# Patient Record
Sex: Male | Born: 1960 | Hispanic: No | Marital: Single | Smoking: Never smoker
Health system: Southern US, Community
[De-identification: ages and names within clinical notes are randomized; demographics above are authoritative.]

## PROBLEM LIST (undated history)

## (undated) ENCOUNTER — Telehealth

## (undated) ENCOUNTER — Encounter

## (undated) ENCOUNTER — Ambulatory Visit: Payer: Medicare (Managed Care)

## (undated) ENCOUNTER — Encounter: Attending: Plastic and Reconstructive Surgery | Primary: Plastic and Reconstructive Surgery

## (undated) ENCOUNTER — Encounter: Attending: Gastroenterology | Primary: Gastroenterology

## (undated) ENCOUNTER — Ambulatory Visit: Payer: MEDICARE

## (undated) ENCOUNTER — Ambulatory Visit

## (undated) ENCOUNTER — Encounter
Attending: Student in an Organized Health Care Education/Training Program | Primary: Student in an Organized Health Care Education/Training Program

## (undated) ENCOUNTER — Ambulatory Visit: Payer: MEDICARE | Attending: Hand Surgery | Primary: Hand Surgery

## (undated) ENCOUNTER — Encounter: Attending: General Acute Care Hospital | Primary: General Acute Care Hospital

## (undated) ENCOUNTER — Encounter: Attending: Orthopaedic Surgery | Primary: Orthopaedic Surgery

## (undated) ENCOUNTER — Encounter: Attending: Adult Health | Primary: Adult Health

## (undated) ENCOUNTER — Encounter: Attending: Physician Assistant | Primary: Physician Assistant

## (undated) ENCOUNTER — Ambulatory Visit
Payer: Medicare (Managed Care) | Attending: Plastic and Reconstructive Surgery | Primary: Plastic and Reconstructive Surgery

## (undated) ENCOUNTER — Telehealth: Attending: Family | Primary: Family

## (undated) ENCOUNTER — Ambulatory Visit: Attending: Orthopaedic Surgery | Primary: Orthopaedic Surgery

## (undated) ENCOUNTER — Inpatient Hospital Stay

## (undated) ENCOUNTER — Ambulatory Visit: Payer: Medicare (Managed Care) | Attending: Nephrology | Primary: Nephrology

## (undated) ENCOUNTER — Encounter: Attending: Family | Primary: Family

## (undated) ENCOUNTER — Telehealth: Attending: Plastic and Reconstructive Surgery | Primary: Plastic and Reconstructive Surgery

## (undated) ENCOUNTER — Ambulatory Visit: Payer: Medicare (Managed Care) | Attending: Hematology & Oncology | Primary: Hematology & Oncology

## (undated) ENCOUNTER — Ambulatory Visit
Payer: MEDICARE | Attending: Student in an Organized Health Care Education/Training Program | Primary: Student in an Organized Health Care Education/Training Program

## (undated) ENCOUNTER — Ambulatory Visit
Payer: Medicare (Managed Care) | Attending: Student in an Organized Health Care Education/Training Program | Primary: Student in an Organized Health Care Education/Training Program

## (undated) ENCOUNTER — Encounter: Attending: Family Medicine | Primary: Family Medicine

## (undated) ENCOUNTER — Ambulatory Visit: Payer: Medicare (Managed Care) | Attending: Family | Primary: Family

## (undated) ENCOUNTER — Ambulatory Visit: Payer: MEDICARE | Attending: Nephrology | Primary: Nephrology

## (undated) ENCOUNTER — Encounter: Attending: Medical | Primary: Medical

## (undated) ENCOUNTER — Ambulatory Visit
Payer: Medicare (Managed Care) | Attending: Rehabilitative and Restorative Service Providers" | Primary: Rehabilitative and Restorative Service Providers"

## (undated) ENCOUNTER — Ambulatory Visit: Attending: Family | Primary: Family

## (undated) DIAGNOSIS — N182 Chronic kidney disease, stage 2 (mild): Secondary | ICD-10-CM

## (undated) DIAGNOSIS — L409 Psoriasis, unspecified: Secondary | ICD-10-CM

## (undated) DIAGNOSIS — G709 Myoneural disorder, unspecified: Secondary | ICD-10-CM

## (undated) DIAGNOSIS — I10 Essential (primary) hypertension: Secondary | ICD-10-CM

## (undated) DIAGNOSIS — K219 Gastro-esophageal reflux disease without esophagitis: Secondary | ICD-10-CM

## (undated) DIAGNOSIS — E119 Type 2 diabetes mellitus without complications: Secondary | ICD-10-CM

---

## 2017-05-06 ENCOUNTER — Ambulatory Visit: Admit: 2017-05-06 | Discharge: 2017-05-06

## 2017-05-20 ENCOUNTER — Ambulatory Visit: Admit: 2017-05-20 | Discharge: 2017-05-20

## 2017-05-27 ENCOUNTER — Ambulatory Visit: Admit: 2017-05-27 | Discharge: 2017-05-28

## 2017-06-03 ENCOUNTER — Ambulatory Visit: Admit: 2017-06-03 | Discharge: 2017-06-04

## 2017-06-10 ENCOUNTER — Ambulatory Visit: Admit: 2017-06-10 | Discharge: 2017-06-11

## 2017-06-19 ENCOUNTER — Ambulatory Visit: Admit: 2017-06-19 | Discharge: 2017-06-20

## 2017-06-24 ENCOUNTER — Ambulatory Visit: Admit: 2017-06-24 | Discharge: 2017-06-25

## 2017-07-03 ENCOUNTER — Ambulatory Visit: Admit: 2017-07-03 | Discharge: 2017-07-04

## 2017-07-10 ENCOUNTER — Ambulatory Visit: Admit: 2017-07-10 | Discharge: 2017-07-11

## 2018-04-03 ENCOUNTER — Ambulatory Visit: Admit: 2018-04-03 | Discharge: 2018-04-06

## 2018-04-10 ENCOUNTER — Ambulatory Visit: Admit: 2018-04-10 | Discharge: 2018-04-10

## 2018-04-10 DIAGNOSIS — L03818 Cellulitis of other sites: Principal | ICD-10-CM

## 2018-04-11 ENCOUNTER — Ambulatory Visit
Admit: 2018-04-11 | Discharge: 2018-06-04 | Disposition: A | Discharge status: Home / Self Care | Payer: MEDICARE | Source: Other Acute Inpatient Hospital

## 2018-04-11 ENCOUNTER — Encounter
Admit: 2018-04-11 | Discharge: 2018-06-04 | Disposition: A | Discharge status: Home / Self Care | Payer: MEDICARE | Source: Other Acute Inpatient Hospital | Attending: Anesthesiology

## 2018-04-11 ENCOUNTER — Encounter
Admit: 2018-04-11 | Discharge: 2018-06-04 | Disposition: A | Discharge status: Home / Self Care | Payer: MEDICARE | Source: Other Acute Inpatient Hospital | Attending: Student in an Organized Health Care Education/Training Program

## 2018-04-11 DIAGNOSIS — L03818 Cellulitis of other sites: Principal | ICD-10-CM

## 2018-04-19 DIAGNOSIS — L03818 Cellulitis of other sites: Principal | ICD-10-CM

## 2018-04-20 DIAGNOSIS — L03818 Cellulitis of other sites: Principal | ICD-10-CM

## 2018-04-22 DIAGNOSIS — L03818 Cellulitis of other sites: Principal | ICD-10-CM

## 2018-04-23 DIAGNOSIS — L03818 Cellulitis of other sites: Principal | ICD-10-CM

## 2018-04-27 DIAGNOSIS — L03818 Cellulitis of other sites: Principal | ICD-10-CM

## 2018-05-01 DIAGNOSIS — L03818 Cellulitis of other sites: Principal | ICD-10-CM

## 2018-05-05 DIAGNOSIS — L03818 Cellulitis of other sites: Principal | ICD-10-CM

## 2018-05-07 DIAGNOSIS — L03818 Cellulitis of other sites: Principal | ICD-10-CM

## 2018-05-11 DIAGNOSIS — L03818 Cellulitis of other sites: Principal | ICD-10-CM

## 2018-05-18 DIAGNOSIS — L03818 Cellulitis of other sites: Principal | ICD-10-CM

## 2018-05-26 ENCOUNTER — Ambulatory Visit: Admit: 2018-05-26 | Discharge: 2018-05-27 | Payer: MEDICARE | Attending: Hand Surgery | Primary: Hand Surgery

## 2018-05-27 DIAGNOSIS — L03818 Cellulitis of other sites: Principal | ICD-10-CM

## 2018-05-28 DIAGNOSIS — L03818 Cellulitis of other sites: Principal | ICD-10-CM

## 2018-05-29 DIAGNOSIS — L03818 Cellulitis of other sites: Principal | ICD-10-CM

## 2018-06-03 DIAGNOSIS — L03818 Cellulitis of other sites: Principal | ICD-10-CM

## 2018-06-04 MED ORDER — OXYCODONE 10 MG TABLET
ORAL_TABLET | Freq: Four times a day (QID) | ORAL | 0 refills | 0 days | Status: CP | PRN
Start: 2018-06-04 — End: 2018-06-09
  Filled 2018-06-04: qty 20, 5d supply, fill #0

## 2018-06-04 MED ORDER — LEVEMIR FLEXTOUCH U-100 INSULIN 100 UNIT/ML (3 ML) SUBCUTANEOUS PEN
Freq: Every evening | SUBCUTANEOUS | 0 refills | 0.00000 days
Start: 2018-06-04 — End: 2018-07-04

## 2018-06-04 MED ORDER — INSULIN ASPART U-100  100 UNIT/ML SUBCUTANEOUS SOLUTION
Freq: Three times a day (TID) | SUBCUTANEOUS | 3 refills | 0 days
Start: 2018-06-04 — End: ?

## 2018-06-04 MED ORDER — GABAPENTIN 300 MG CAPSULE
ORAL_CAPSULE | Freq: Three times a day (TID) | ORAL | 0 refills | 0 days | Status: CP
Start: 2018-06-04 — End: 2018-07-04
  Filled 2018-06-04: qty 270, 30d supply, fill #0

## 2018-06-04 MED ORDER — BACITRACIN ZINC 500 UNIT/GRAM TOPICAL OINTMENT
Freq: Two times a day (BID) | TOPICAL | 0 refills | 0.00000 days | Status: CP | PRN
Start: 2018-06-04 — End: ?

## 2018-06-04 MED FILL — LISINOPRIL 10 MG TABLET: 30 days supply | Qty: 30 | Fill #0 | Status: AC

## 2018-06-04 MED FILL — CHLORTHALIDONE 25 MG TABLET: 30 days supply | Qty: 30 | Fill #0 | Status: AC

## 2018-06-04 MED FILL — OXYCODONE 10 MG TABLET: 5 days supply | Qty: 20 | Fill #0 | Status: AC

## 2018-06-04 MED FILL — GABAPENTIN 300 MG CAPSULE: 30 days supply | Qty: 270 | Fill #0 | Status: AC

## 2018-06-05 MED ORDER — CHLORTHALIDONE 25 MG TABLET
ORAL_TABLET | Freq: Every day | ORAL | 0 refills | 0 days | Status: CP
Start: 2018-06-05 — End: 2018-07-05
  Filled 2018-06-04: qty 30, 30d supply, fill #0

## 2018-06-05 MED ORDER — LISINOPRIL 10 MG TABLET
ORAL_TABLET | Freq: Every day | ORAL | 0 refills | 0.00000 days | Status: CP
Start: 2018-06-05 — End: 2018-07-05
  Filled 2018-06-04: qty 30, 30d supply, fill #0

## 2018-11-30 ENCOUNTER — Ambulatory Visit: Admit: 2018-11-30 | Discharge: 2018-12-01

## 2018-12-07 ENCOUNTER — Ambulatory Visit: Admit: 2018-12-07 | Discharge: 2018-12-07

## 2019-01-03 ENCOUNTER — Ambulatory Visit: Admit: 2019-01-03 | Discharge: 2019-01-03

## 2019-03-26 ENCOUNTER — Ambulatory Visit: Admit: 2019-03-26 | Discharge: 2019-03-29

## 2019-03-29 DIAGNOSIS — L03011 Cellulitis of right finger: Principal | ICD-10-CM

## 2019-03-30 ENCOUNTER — Ambulatory Visit: Admit: 2019-03-30 | Discharge: 2019-03-30

## 2019-04-03 ENCOUNTER — Ambulatory Visit: Admit: 2019-04-03 | Discharge: 2019-04-06

## 2019-04-21 ENCOUNTER — Ambulatory Visit
Admit: 2019-04-21 | Discharge: 2019-04-22 | Payer: MEDICARE | Attending: Orthopaedic Surgery | Primary: Orthopaedic Surgery

## 2019-04-21 DIAGNOSIS — S68119A Complete traumatic metacarpophalangeal amputation of unspecified finger, initial encounter: Principal | ICD-10-CM

## 2019-04-21 DIAGNOSIS — E1140 Type 2 diabetes mellitus with diabetic neuropathy, unspecified: Principal | ICD-10-CM

## 2019-04-21 DIAGNOSIS — L03011 Cellulitis of right finger: Principal | ICD-10-CM

## 2019-05-01 ENCOUNTER — Ambulatory Visit: Admit: 2019-05-01 | Discharge: 2019-05-02

## 2019-05-05 ENCOUNTER — Ambulatory Visit: Admit: 2019-05-05 | Discharge: 2019-05-06

## 2019-05-12 ENCOUNTER — Emergency Department
Admission: EM | Admit: 2019-05-12 | Discharge: 2019-05-12 | Disposition: A | Discharge status: Home / Self Care | Payer: Medicare Other | Attending: Emergency Medicine | Admitting: Emergency Medicine

## 2019-05-12 ENCOUNTER — Other Ambulatory Visit: Payer: Self-pay

## 2019-05-12 ENCOUNTER — Encounter: Payer: Self-pay | Admitting: Emergency Medicine

## 2019-05-12 ENCOUNTER — Emergency Department: Payer: Medicare Other

## 2019-05-12 DIAGNOSIS — W19XXXA Unspecified fall, initial encounter: Secondary | ICD-10-CM

## 2019-05-12 DIAGNOSIS — S0990XA Unspecified injury of head, initial encounter: Secondary | ICD-10-CM | POA: Diagnosis present

## 2019-05-12 DIAGNOSIS — E119 Type 2 diabetes mellitus without complications: Secondary | ICD-10-CM | POA: Diagnosis not present

## 2019-05-12 DIAGNOSIS — Y939 Activity, unspecified: Secondary | ICD-10-CM | POA: Diagnosis not present

## 2019-05-12 DIAGNOSIS — W010XXA Fall on same level from slipping, tripping and stumbling without subsequent striking against object, initial encounter: Secondary | ICD-10-CM | POA: Diagnosis not present

## 2019-05-12 DIAGNOSIS — S00411A Abrasion of right ear, initial encounter: Secondary | ICD-10-CM

## 2019-05-12 DIAGNOSIS — Y92002 Bathroom of unspecified non-institutional (private) residence single-family (private) house as the place of occurrence of the external cause: Secondary | ICD-10-CM | POA: Diagnosis not present

## 2019-05-12 DIAGNOSIS — Y999 Unspecified external cause status: Secondary | ICD-10-CM | POA: Diagnosis not present

## 2019-05-12 HISTORY — DX: Type 2 diabetes mellitus without complications: E11.9

## 2019-05-12 MED ORDER — HYDROCODONE-ACETAMINOPHEN 5-325 MG PO TABS
1.0000 | ORAL_TABLET | Freq: Once | ORAL | Status: AC
  Administered 2019-05-12: 1 via ORAL
  Filled 2019-05-12: qty 1

## 2019-05-12 MED ORDER — ONDANSETRON 4 MG PO TBDP: 4.0000 mg | ORAL_TABLET | Freq: Three times a day (TID) | ORAL | 0 refills | Status: DC | PRN

## 2019-05-12 MED ORDER — BACITRACIN ZINC 500 UNIT/GM EX OINT
TOPICAL_OINTMENT | Freq: Once | CUTANEOUS | Status: AC
  Administered 2019-05-12: 1 via TOPICAL
  Filled 2019-05-12: qty 0.9

## 2019-05-12 MED ORDER — ONDANSETRON 4 MG PO TBDP
4.0000 mg | ORAL_TABLET | Freq: Once | ORAL | Status: AC
  Administered 2019-05-12: 4 mg via ORAL
  Filled 2019-05-12: qty 1

## 2019-05-12 MED ORDER — HYDROCODONE-ACETAMINOPHEN 5-325 MG PO TABS: 1.0000 | ORAL_TABLET | Freq: Four times a day (QID) | ORAL | 0 refills | Status: DC | PRN

## 2019-05-12 NOTE — ED Provider Notes (Signed)
[facility] Emergency Department Provider Note   ____________________________________________   First MD Initiated Contact with Patient 05/12/19 [phone]     (approximate)  I have reviewed the triage vital signs and the nursing notes.   HISTORY  Chief Complaint Fall    HPI Christian Flores is a 59 y.o. male who presents to the ED from home status post mechanical fall.  Patient was walking to the bathroom with his cane when he slipped and struck the right side of his head and ear on his bathtub.  Denies LOC.  Complains of head pain.  Denies vision changes, neck pain, chest pain, shortness of breath, abdominal pain, nausea, vomiting or dizziness.  Denies anticoagulant use.       Past Medical History:  Diagnosis Date  . Diabetes mellitus without complication ([place])     There are no problems to display for this patient.   History reviewed. No pertinent surgical history.  Prior to Admission medications   Not on File    Allergies Patient has no known allergies.  History reviewed. No pertinent family history.  Social History Social History   Tobacco Use  . Smoking status: Never Smoker  . Smokeless tobacco: Never Used  Substance Use Topics  . Alcohol use: Not on file  . Drug use: Not on file    Review of Systems  Constitutional: No fever/chills Eyes: No visual changes. ENT: Positive for right ear abrasion.  No sore throat. Cardiovascular: Denies chest pain. Respiratory: Denies shortness of breath. Gastrointestinal: No abdominal pain.  No nausea, no vomiting.  No diarrhea.  No constipation. Genitourinary: Negative for dysuria. Musculoskeletal: Negative for back pain. Skin: Negative for rash. Neurological: Positive for head pain.  Negative for headaches, focal weakness or numbness.   ____________________________________________   PHYSICAL EXAM:  VITAL SIGNS: ED Triage Vitals [05/12/19 0410]  Enc Vitals Group     BP 127/76     Pulse  Rate 90     Resp 18     Temp 98.8 F (37.1 C)     Temp Source Oral     SpO2 99 %     Weight      Height      Head Circumference      Peak Flow      Pain Score      Pain Loc      Pain Edu?      Excl. in [place]?     Constitutional: Alert and oriented. Well appearing and in no acute distress. Eyes: Conjunctivae are normal. PERRL. EOMI. Head: Atraumatic. Ears: Top of right ear with small abrasion.  No active bleeding.  No laceration. Nose: Atraumatic. Mouth/Throat: Mucous membranes are moist.  No dental malocclusion. Neck: No stridor.  No cervical spine tenderness to palpation. Cardiovascular: Normal rate, regular rhythm. Grossly normal heart sounds.  Good peripheral circulation. Respiratory: Normal respiratory effort.  No retractions. Lungs CTAB. Gastrointestinal: Soft and nontender. No distention. No abdominal bruits. No CVA tenderness. Musculoskeletal: No lower extremity tenderness nor edema.  No joint effusions. Neurologic: Alert and oriented x3.  CN II-XII grossly intact.  Normal speech and language. No gross focal neurologic deficits are appreciated.  Skin:  Skin is warm, dry and intact. No rash noted. Psychiatric: Mood and affect are normal. Speech and behavior are normal.  ____________________________________________   LABS (all labs ordered are listed, but only abnormal results are displayed)  Labs Reviewed - No data to display ____________________________________________  EKG  None ____________________________________________  RADIOLOGY  ED MD interpretation: No ICH  Official radiology report(s): CT Head Wo Contrast  Result Date: 05/12/2019 CLINICAL DATA:  Head trauma with headache EXAM: CT HEAD WITHOUT CONTRAST TECHNIQUE: Contiguous axial images were obtained from the base of the skull through the vertex without intravenous contrast. COMPARISON:  None. FINDINGS: Brain: No evidence of acute infarction, hemorrhage, hydrocephalus, extra-axial collection or mass  lesion/mass effect. Vascular: No hyperdense vessel or unexpected calcification. Skull: Normal. Negative for fracture or focal lesion. Sinuses/Orbits: No acute finding. IMPRESSION: Negative.  No evidence of intracranial injury. Electronically Signed   By: Marnee Spring M.D.   On: 05/12/2019 04:48    ____________________________________________   PROCEDURES  Procedure(s) performed (including Critical Care):  Procedures   ____________________________________________   INITIAL IMPRESSION / ASSESSMENT AND PLAN / ED COURSE  As part of my medical decision making, I reviewed the following data within the electronic MEDICAL RECORD NUMBER Nursing notes reviewed and incorporated, Old chart reviewed, Radiograph reviewed, Notes from prior ED visits and [place] Controlled Substance Database     Christian Flores was evaluated in Emergency Department on 05/12/2019 for the symptoms described in the history of present illness. He was evaluated in the context of the global COVID-19 pandemic, which necessitated consideration that the patient might be at risk for infection with the SARS-CoV-2 virus that causes COVID-19. Institutional protocols and algorithms that pertain to the evaluation of patients at risk for COVID-19 are in a state of rapid change based on information released by regulatory bodies including the CDC and federal and state organizations. These policies and algorithms were followed during the patient's care in the ED.    59 year old male who presents status post mechanical fall with head pain and right ear abrasion.  Differential diagnosis includes but is not limited to ICH, SDH, concussion, etc.  Will obtain noncontrast CT head, apply bacitracin to right ear abrasion; Norco paired with Zofran for head pain.  Will reassess.   Clinical Course as of May 11 454  Wed May 12, 2019  2979 Updated patient on unremarkable CT head.  Strict head injury precautions given.  Patient verbalizes understanding  agrees with plan of care.   [JS]    Clinical Course User Index [JS] Irean Hong, MD     ____________________________________________   FINAL CLINICAL IMPRESSION(S) / ED DIAGNOSES  Final diagnoses:  Fall, initial encounter  Abrasion of right ear, initial encounter  Injury of head, initial encounter     ED Discharge Orders    None       Note:  This document was prepared using Dragon voice recognition software and may include unintentional dictation errors.   Irean Hong, MD 05/12/19 [phone]

## 2019-05-12 NOTE — ED Notes (Signed)
Signature pad not working. 

## 2019-05-12 NOTE — Discharge Instructions (Addendum)
1.  You may take medicines as needed for pain and nausea (Norco/Zofran #15). 2.  Return to the ER for worsening symptoms, persistent vomiting, lethargy or other concerns.

## 2019-05-12 NOTE — ED Triage Notes (Signed)
Pt reports he slipped while walking to bathroom this AM. Pt hit right sided of head and is now c/o HA and has laceration to the right ear. Pt denies LOC.

## 2019-05-19 ENCOUNTER — Ambulatory Visit
Admit: 2019-05-19 | Discharge: 2019-05-20 | Payer: MEDICARE | Attending: Orthopaedic Surgery | Primary: Orthopaedic Surgery

## 2019-06-01 ENCOUNTER — Ambulatory Visit: Admit: 2019-06-01 | Discharge: 2019-06-02

## 2019-08-25 ENCOUNTER — Ambulatory Visit: Admit: 2019-08-25 | Discharge: 2019-08-25

## 2020-08-14 IMAGING — CT CT HEAD W/O CM
3 series · 16 of 45 positions shown, 19 images · non-contrast
Comparison: None.

CLINICAL DATA: Head trauma with headache

EXAM:
CT HEAD WITHOUT CONTRAST
TECHNIQUE: Contiguous axial images were obtained from the base of the skull
through the vertex without intravenous contrast.

[Series 2: head wo · axial · 0.42mm/px · z∈[-109,+6]mm · 10 of 28 slices shown, 13 images]
[im 3/28  brain]
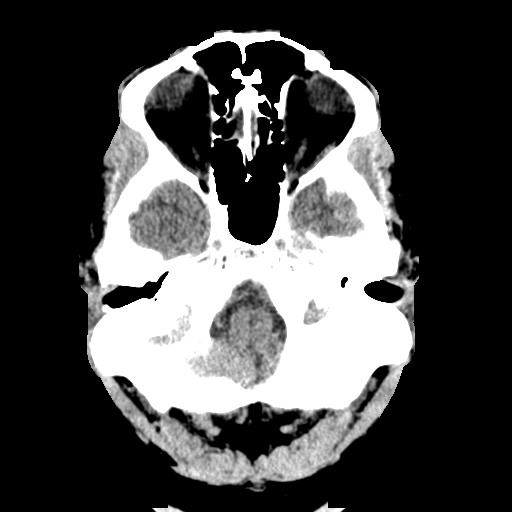
[im 3/28  bone]
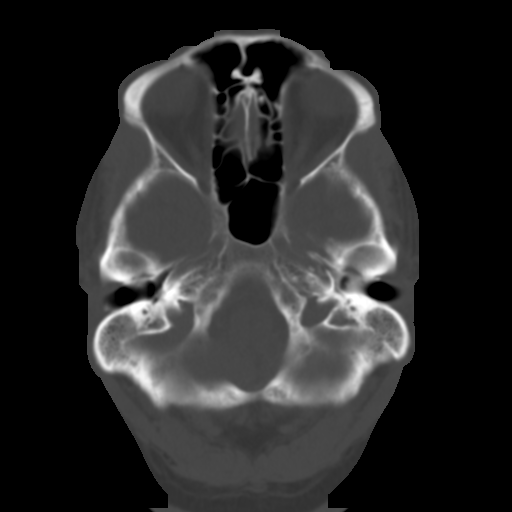
[im 5/28  brain]
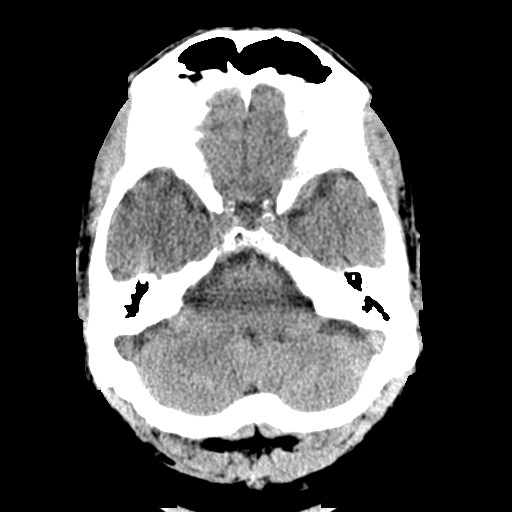
[im 8/28  brain]
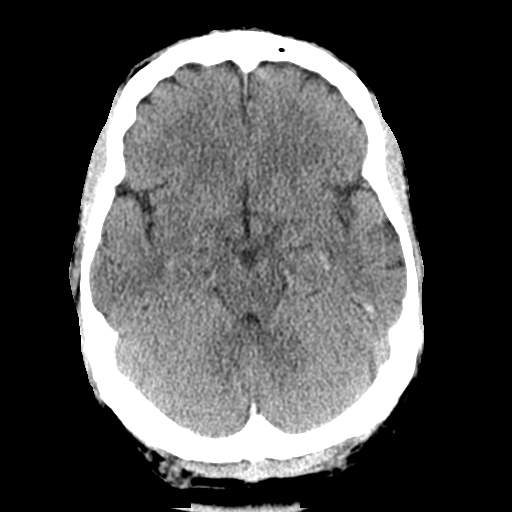
[im 11/28  brain]
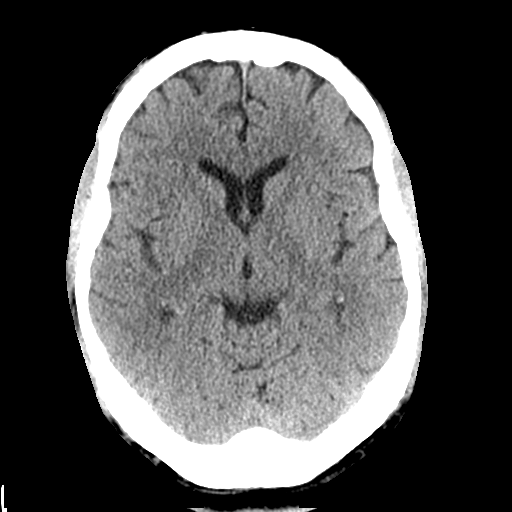
[im 13/28  brain]
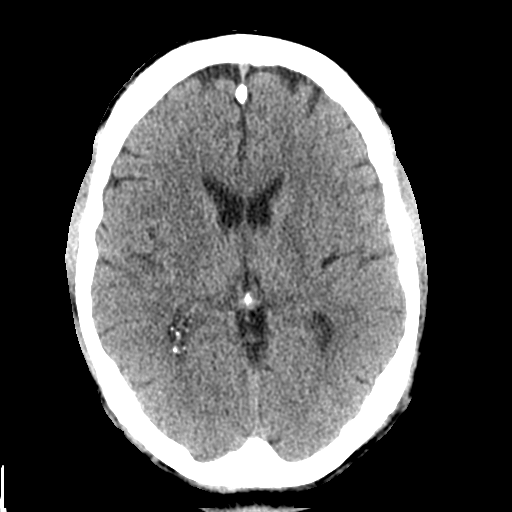
[im 13/28  bone]
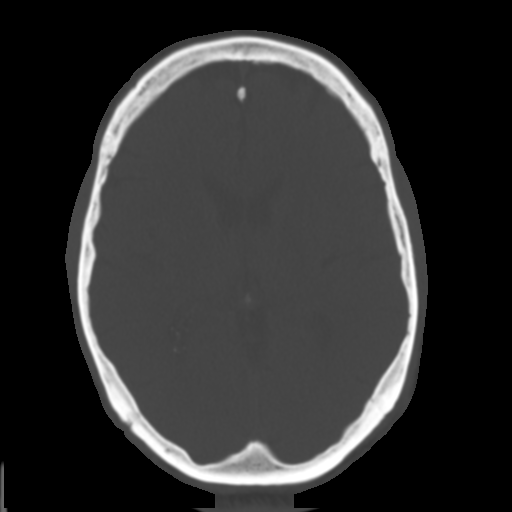
[im 16/28  brain]
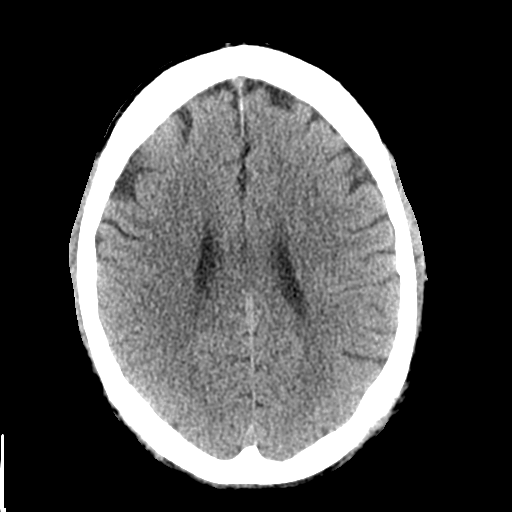
[im 18/28  brain]
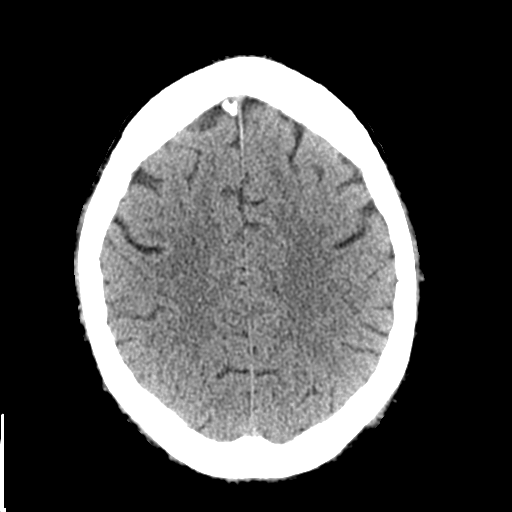
[im 21/28  brain]
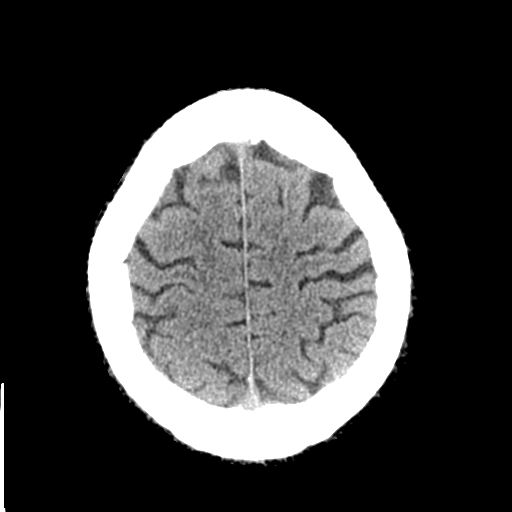
[im 24/28  brain]
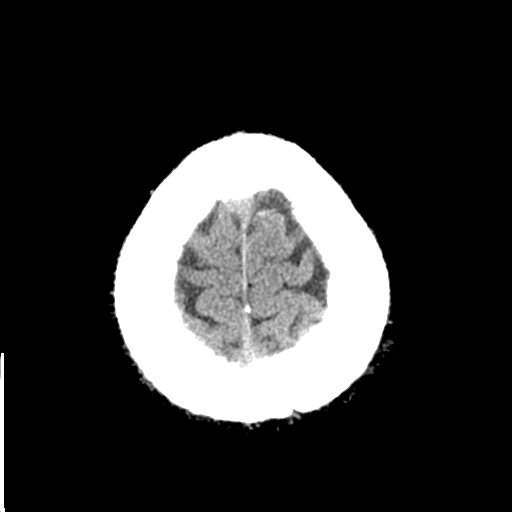
[im 24/28  bone]
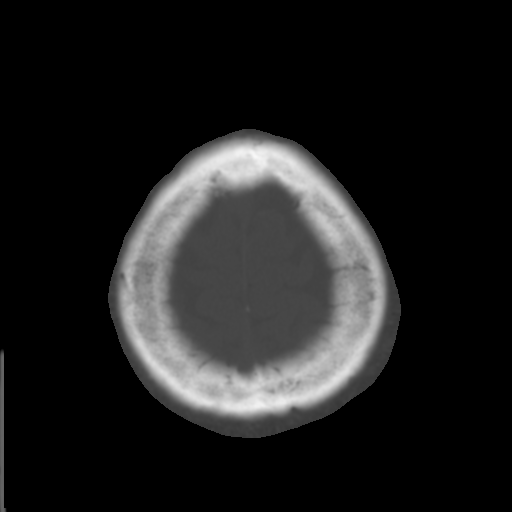
[im 26/28  brain]
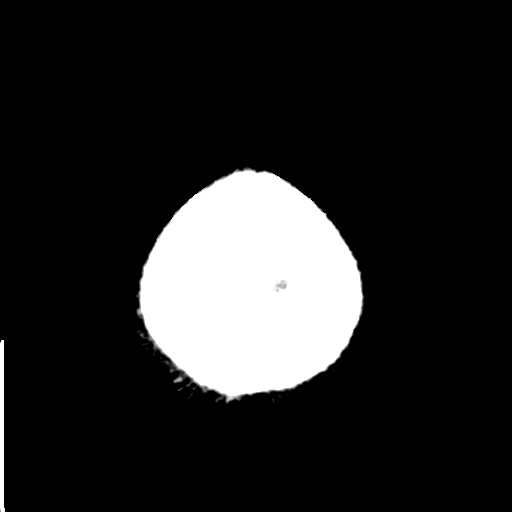

[Series 4: coronal soft tissue · coronal · 0.33mm/px · 3 of 68 slices shown]
[im 23/68  brain]
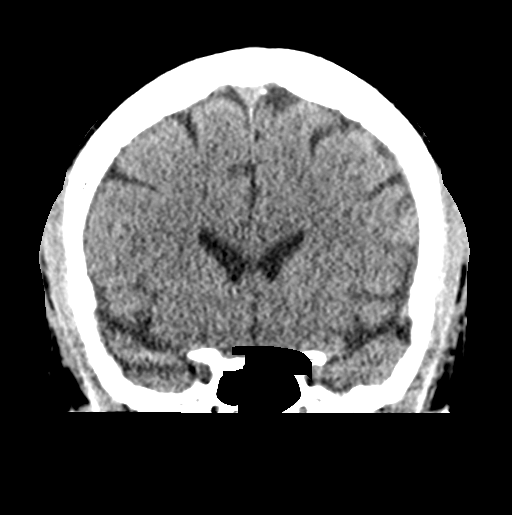
[im 30/68  brain]
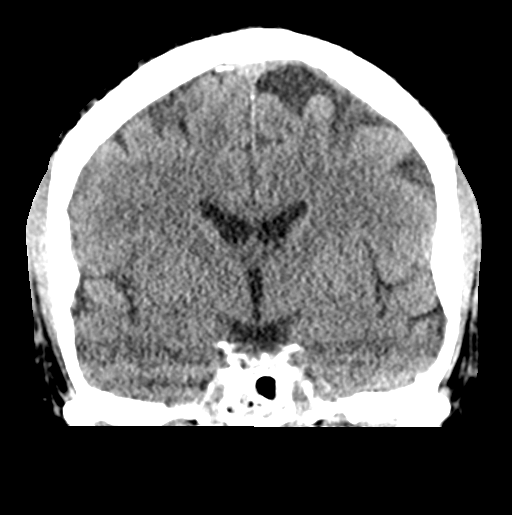
[im 38/68  brain]
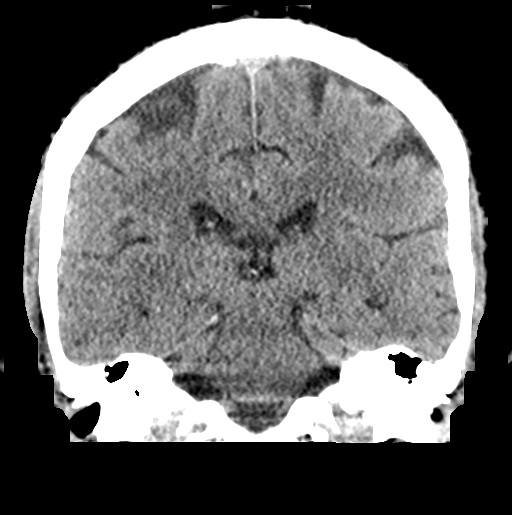

[Series 5: sagittal soft tissue · sagittal · 0.33mm/px · 3 of 52 slices shown]
[im 18/52  brain]
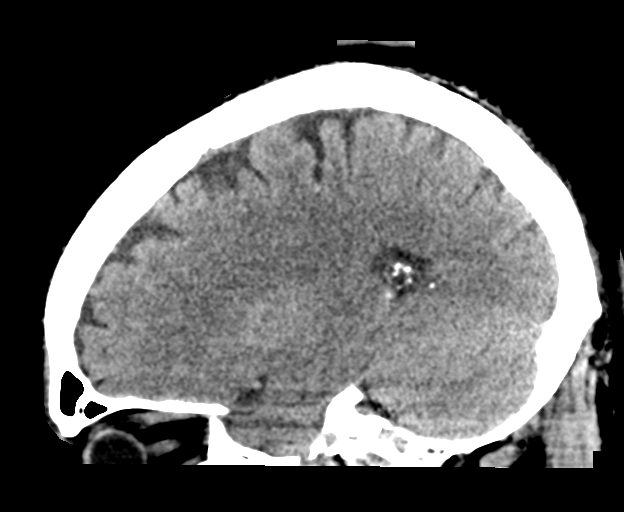
[im 26/52  brain]
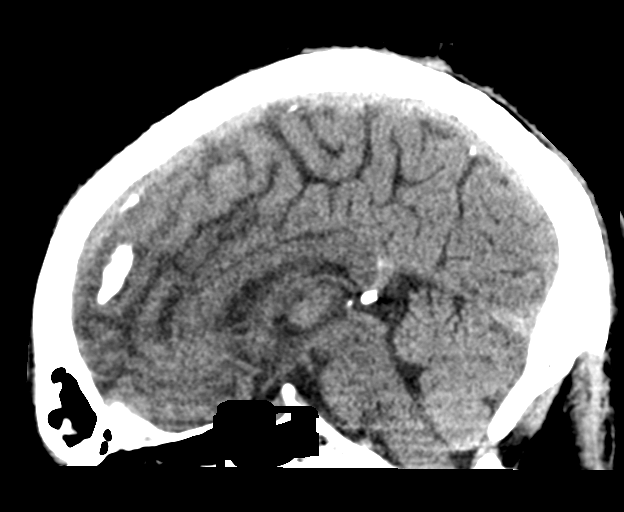
[im 35/52  brain]
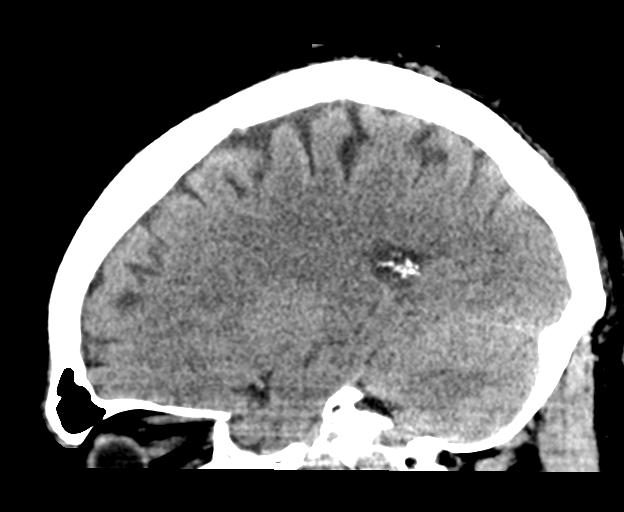

[16 of 45 positions shown; findings below may reference images not displayed]

FINDINGS: Brain: No evidence of acute infarction, hemorrhage, hydrocephalus,
extra-axial collection or mass lesion/mass effect.

Vascular: No hyperdense vessel or unexpected calcification.

Skull: Normal. Negative for fracture or focal lesion.

Sinuses/Orbits: No acute finding.
IMPRESSION: Negative.  No evidence of intracranial injury.

## 2020-08-29 ENCOUNTER — Ambulatory Visit: Admit: 2020-08-29 | Discharge: 2020-08-30 | Payer: MEDICARE

## 2020-08-29 DIAGNOSIS — Z0000 Encounter for general adult medical examination without abnormal findings: Principal | ICD-10-CM

## 2020-08-29 DIAGNOSIS — L409 Psoriasis, unspecified: Principal | ICD-10-CM

## 2020-08-29 DIAGNOSIS — K219 Gastro-esophageal reflux disease without esophagitis: Principal | ICD-10-CM

## 2020-08-29 DIAGNOSIS — E1142 Type 2 diabetes mellitus with diabetic polyneuropathy: Principal | ICD-10-CM

## 2020-08-29 DIAGNOSIS — D649 Anemia, unspecified: Principal | ICD-10-CM

## 2020-08-29 DIAGNOSIS — I10 Essential (primary) hypertension: Principal | ICD-10-CM

## 2020-08-29 DIAGNOSIS — Z125 Encounter for screening for malignant neoplasm of prostate: Principal | ICD-10-CM

## 2020-08-29 DIAGNOSIS — E11628 Type 2 diabetes mellitus with other skin complications: Principal | ICD-10-CM

## 2020-08-29 DIAGNOSIS — Z794 Long term (current) use of insulin: Principal | ICD-10-CM

## 2020-08-29 MED ORDER — FREESTYLE LIBRE 2 SENSOR KIT
2 refills | 0 days | Status: CP
Start: 2020-08-29 — End: ?

## 2020-08-29 MED ORDER — BLOOD-GLUCOSE METER KIT WRAPPER
0 refills | 0 days | Status: CP
Start: 2020-08-29 — End: ?

## 2020-08-29 MED ORDER — FREESTYLE LIBRE 14 DAY SENSOR KIT
12 refills | 0 days | Status: CP
Start: 2020-08-29 — End: 2020-08-29

## 2020-08-29 MED ORDER — GABAPENTIN 800 MG TABLET
ORAL_TABLET | Freq: Three times a day (TID) | ORAL | 0 refills | 90 days | Status: CP
Start: 2020-08-29 — End: 2022-01-30

## 2020-08-29 MED ORDER — INSULIN ASPART U-100  100 UNIT/ML SUBCUTANEOUS SOLUTION
Freq: Three times a day (TID) | SUBCUTANEOUS | 0 refills | 42 days | Status: CP
Start: 2020-08-29 — End: ?

## 2020-08-29 MED ORDER — ATORVASTATIN 40 MG TABLET
ORAL_TABLET | Freq: Every day | ORAL | 0 refills | 90.00000 days | Status: CP
Start: 2020-08-29 — End: ?

## 2020-08-29 MED ORDER — TRIAMCINOLONE ACETONIDE 0.1 % TOPICAL CREAM
Freq: Two times a day (BID) | TOPICAL | 0 refills | 0 days | Status: CP
Start: 2020-08-29 — End: ?

## 2020-08-29 MED ORDER — INSULIN DETEMIR (U-100) 100 UNIT/ML (3 ML) SUBCUTANEOUS PEN
Freq: Every day | SUBCUTANEOUS | 0 refills | 30.00000 days | Status: CP
Start: 2020-08-29 — End: 2022-01-30

## 2020-08-29 MED ORDER — EMPAGLIFLOZIN 10 MG TABLET
ORAL_TABLET | Freq: Every day | ORAL | 0 refills | 90 days | Status: CP
Start: 2020-08-29 — End: ?

## 2020-08-30 MED ORDER — FREESTYLE LIBRE 2 SENSOR KIT
2 refills | 0 days | Status: CP
Start: 2020-08-30 — End: ?

## 2020-09-01 DIAGNOSIS — Z794 Long term (current) use of insulin: Principal | ICD-10-CM

## 2020-09-01 DIAGNOSIS — E11628 Type 2 diabetes mellitus with other skin complications: Principal | ICD-10-CM

## 2020-09-01 MED ORDER — FREESTYLE LIBRE 14 DAY SENSOR KIT
12 refills | 0 days | Status: CP
Start: 2020-09-01 — End: ?

## 2020-09-26 ENCOUNTER — Other Ambulatory Visit: Admit: 2020-09-26 | Discharge: 2020-09-26 | Payer: MEDICARE

## 2020-09-26 ENCOUNTER — Ambulatory Visit: Admit: 2020-09-26 | Discharge: 2020-09-26 | Payer: MEDICARE

## 2020-09-26 DIAGNOSIS — L409 Psoriasis, unspecified: Principal | ICD-10-CM

## 2020-09-26 DIAGNOSIS — Z1159 Encounter for screening for other viral diseases: Principal | ICD-10-CM

## 2020-09-26 DIAGNOSIS — Z79899 Other long term (current) drug therapy: Principal | ICD-10-CM

## 2020-09-26 MED ORDER — BETAMETHASONE, AUGMENTED 0.05 % TOPICAL GEL
Freq: Two times a day (BID) | TOPICAL | 5 refills | 0.00000 days | Status: CP
Start: 2020-09-26 — End: 2021-09-26

## 2020-09-26 MED ORDER — STELARA 45 MG/0.5 ML SUBCUTANEOUS SYRINGE
SUBCUTANEOUS | 0 refills | 0.00000 days | Status: CP
Start: 2020-09-26 — End: ?

## 2020-09-26 MED ORDER — HUMIRA PEN 40 MG/0.8 ML SUBCUTANEOUS KIT
SUBCUTANEOUS | 2 refills | 0.00000 days | Status: CP
Start: 2020-09-26 — End: 2020-09-26

## 2020-09-26 MED ORDER — HUMIRA PEN PSORIASIS-UVEITIS-ADOL HID SUP START 40 MG/0.8 ML SUBCUT KT
ORAL | 0 refills | 0.00000 days | Status: CP
Start: 2020-09-26 — End: 2020-09-26

## 2020-09-27 DIAGNOSIS — Z79899 Other long term (current) drug therapy: Principal | ICD-10-CM

## 2020-09-27 DIAGNOSIS — L409 Psoriasis, unspecified: Principal | ICD-10-CM

## 2020-09-28 ENCOUNTER — Ambulatory Visit: Admit: 2020-09-28 | Discharge: 2020-09-29 | Payer: MEDICARE

## 2020-09-28 DIAGNOSIS — E785 Hyperlipidemia, unspecified: Principal | ICD-10-CM

## 2020-09-28 DIAGNOSIS — E1142 Type 2 diabetes mellitus with diabetic polyneuropathy: Principal | ICD-10-CM

## 2020-09-28 DIAGNOSIS — I10 Essential (primary) hypertension: Principal | ICD-10-CM

## 2020-09-28 DIAGNOSIS — Z794 Long term (current) use of insulin: Principal | ICD-10-CM

## 2020-09-28 DIAGNOSIS — E1169 Type 2 diabetes mellitus with other specified complication: Principal | ICD-10-CM

## 2020-09-28 DIAGNOSIS — E11628 Type 2 diabetes mellitus with other skin complications: Principal | ICD-10-CM

## 2020-09-28 MED ORDER — BETAMETHASONE, AUGMENTED 0.05 % TOPICAL GEL
0 days
Start: 2020-09-28 — End: ?

## 2020-09-28 MED ORDER — GABAPENTIN 800 MG TABLET
ORAL_TABLET | Freq: Three times a day (TID) | ORAL | 1 refills | 90 days | Status: CP
Start: 2020-09-28 — End: 2021-09-28

## 2020-09-28 MED ORDER — LISINOPRIL 2.5 MG TABLET
ORAL_TABLET | Freq: Every day | ORAL | 1 refills | 90.00000 days | Status: CP
Start: 2020-09-28 — End: 2021-09-28

## 2020-09-28 MED ORDER — ATORVASTATIN 40 MG TABLET
ORAL_TABLET | Freq: Every day | ORAL | 1 refills | 90 days | Status: CP
Start: 2020-09-28 — End: ?

## 2020-09-29 DIAGNOSIS — Z794 Long term (current) use of insulin: Principal | ICD-10-CM

## 2020-09-29 DIAGNOSIS — E11628 Type 2 diabetes mellitus with other skin complications: Principal | ICD-10-CM

## 2020-10-02 NOTE — Unmapped (Addendum)
[facility] Specialty Medication Onboarding    Specialty Medication: Stelara 45mg /0.68ml syringe  Prior Authorization: Approved   Financial Assistance: No - copay  <$25  Final Copay/Day Supply: $9.85 / 28 days (for load )     Insurance Restrictions: Yes - max 1 month supply     Notes to Pharmacist: Messaging provider to reactivate maintenance dose    The triage team has completed the benefits investigation and has determined that the patient is able to fill this medication at [facility]. Please contact the patient to complete the onboarding or follow up with the prescribing physician as needed.

## 2020-10-03 DIAGNOSIS — L409 Psoriasis, unspecified: Principal | ICD-10-CM

## 2020-10-03 DIAGNOSIS — Z79899 Other long term (current) drug therapy: Principal | ICD-10-CM

## 2020-10-03 MED ORDER — STELARA 45 MG/0.5 ML SUBCUTANEOUS SYRINGE
SUBCUTANEOUS | 0 refills | 0.00000 days | Status: CP
Start: 2020-10-03 — End: ?
  Filled 2020-10-04: qty 0.5, 28d supply, fill #0

## 2020-10-03 MED ORDER — EMPTY CONTAINER
2 refills | 0 days
Start: 2020-10-03 — End: ?

## 2020-10-03 NOTE — Unmapped (Signed)
[facility]   Patient Onboarding/Medication Counseling    Cory Bentley is a 60 y.o. male with psoriasis who I am counseling today on initiation of therapy.  I am speaking to the patient.    Was a Nurse, learning disability used for this call? No    Verified patient's date of birth / HIPAA.    Specialty medication(s) to be sent: Inflammatory Disorders: Stelara      Non-specialty medications/supplies to be sent: sharps kit      Medications not needed at this time: na         Stelara (ustekinumab)    Medication & Administration     Dosage: Plaque psoriasis (over 12yo and 60-100kg): Inject 45mg  under the skin at weeks 0 and 4, then every 12 weeks thereafter    Lab tests required prior to treatment initiation:  ??? Tuberculosis: Tuberculosis screening resulted in a non-reactive Quantiferon TB Gold assay.    Administration:     Prefilled syringe  1. Gather all supplies needed for injection on a clean, flat working surface: medication syringe(s) removed from packaging, alcohol swab, sharps container, etc.  2. Look at the medication label - look for correct medication, correct dose, and check the expiration date  3. Look at the medication - the liquid in the syringe should appear clear and colorless to slightly yellow, you may see a few white particles  4. Lay the syringe on a flat surface and allow it to warm up to room temperature for at least 15-30 minutes  5. Select injection site - you can use the front of your thigh or your belly (but not the area 2 inches around your belly button); if someone else is giving you the injection you can also use your upper arm in the skin covering your triceps muscle or in the buttocks  6. Prepare injection site - wash your hands and clean the skin at the injection site with an alcohol swab and let it air dry, do not touch the injection site again before the injection  7. Pull off the needle safety cap, do not remove until immediately prior to injection  8. Pinch the skin - with your hand not holding the syringe pinch up a fold of skin at the injection site using your forefinger and thumb  9. Insert the needle into the fold of skin at about a 45 degree angle - it's best to use a quick dart-like motion  10. Push the plunger down slowly as far as it will go until the syringe is empty, if the plunger is not fully depressed the needle shield will not extend to cover the needle when it is removed, hold the syringe in place for a full 5 seconds  11. Check that the syringe is empty and keep pressing down on the plunger while you pull the needle out at the same angle as inserted; after the needle is removed completely from the skin, release the plunger allowing the needle shield to activate and cover the used needle  12. Dispose of the used syringe immediately in your sharps disposal container, do not attempt to recap the needle prior to disposing  13. If you see any blood at the injection site, press a cotton ball or gauze on the site and maintain pressure until the bleeding stops, do not rub the injection site      Adherence/Missed dose instructions:  If your injection is given more than 7-10 days after your scheduled injection date - consult your  pharmacist for additional instructions on how to adjust your dosing schedule.    Goals of Therapy     Plaque Psoriasis  ??? Minimize areas of skin involvement (% BSA)  ??? Avoidance of long term glucocorticoid use  ??? Maintenance of effective psychosocial functioning      Side Effects & Monitoring Parameters     ??? Injection site reaction (redness, irritation, inflammation localized to the site of administration)  ??? Signs of a common cold - minor sore throat, runny or stuffy nose, etc.  ??? Feeling tired or weak  ??? Headache    The following side effects should be reported to the provider:  ??? Signs of a hypersensitivity reaction - rash; hives; itching; red, swollen, blistered, or peeling skin; wheezing; tightness in the chest or throat; difficulty breathing, swallowing, or talking; swelling of the mouth, face, lips, tongue, or throat; etc.  ??? Reduced immune function - report signs of infection such as fever; chills; body aches; very bad sore throat; ear or sinus pain; cough; more sputum or change in color of sputum; pain with passing urine; wound that will not heal, etc.  Also at a slightly higher risk of some malignancies (mainly skin and blood cancers) due to this reduced immune function.  o In the case of signs of infection - the patient should hold the next dose of Stelara?? and call your primary care provider to ensure adequate medical care.  Treatment may be resumed when infection is treated and patient is asymptomatic.  ??? Changes in skin - a new growth or lump that forms; changes in shape, size, or color of a previous mole or marking  ??? Shortness of breath or chest pain  ??? Vaginal itching or discharge      Contraindications, Warnings, & Precautions     ??? Have your bloodwork checked as you have been told by your prescriber  ??? Talk with your doctor if you are pregnant, planning to become pregnant, or breastfeeding  ??? Discuss the possible need for holding your dose(s) of Stelara?? when a planned procedure is scheduled with the prescriber as it may delay healing/recovery timeline       Drug/Food Interactions     ??? Medication list reviewed in Epic. The patient was instructed to inform the care team before taking any new medications or supplements. No drug interactions identified.   ??? If you have a latex allergy use caution when handling, the needle cap of the Stelara?? prefilled syringe contains a derivative of natural rubber latex  ??? Talk with you prescriber or pharmacist before receiving any live vaccinations while taking this medication and after you stop taking it    Storage, Handling Precautions, & Disposal     ??? Store this medication in the refrigerator.  Do not freeze  ??? If needed, you may store at room temperature for up to 30 days  ??? Store in original packaging, protected from light  ??? Do not shake  ??? Dispose of used syringes/pens in a sharps disposal container            Current Medications (including OTC/herbals), Comorbidities and Allergies     Current Outpatient Medications   Medication Sig Dispense Refill   ??? atorvastatin (LIPITOR) 40 MG tablet Take 1 tablet (40 mg total) by mouth in the morning. 90 tablet 1   ??? blood sugar diagnostic Strp Accu Check Aviva Strip, use 4 times per day for monitoring insulin dependant uncontrolled diabetes     ??? blood-glucose meter  kit Use as instructed 1 each 0   ??? empagliflozin (JARDIANCE) 10 mg tablet Take 2 tablets (20 mg total) by mouth daily. 180 tablet 0   ??? flash glucose sensor (FREESTYLE LIBRE 14 DAY SENSOR) kit Apply new sensor every 14 days and use as directed to monitor and check blood sugars 2 each 12   ??? gabapentin (NEURONTIN) 800 MG tablet Take 1 tablet (800 mg total) by mouth Three (3) times a day. 270 tablet 1   ??? insulin ASPART (NOVOLOG) 100 unit/mL injection Inject 0.08 mL (8 Units total) under the skin Three (3) times a day before meals. 10 mL 0   ??? insulin detemir U-100 (LEVEMIR) 100 unit/mL (3 mL) injection pen Inject 0.5 mL (50 Units total) under the skin daily. 15 mL 0   ??? lisinopriL (PRINIVIL,ZESTRIL) 2.5 MG tablet Take 1 tablet (2.5 mg total) by mouth in the morning. 90 tablet 1   ??? triamcinolone (KENALOG) 0.1 % cream Apply topically Two (2) times a day. 80 g 0   ??? ustekinumab (STELARA) 45 mg/0.5 mL Syrg syringe Inject the contents of 1 syringe (45 mg) under the skin initially and THEN inject 1 syringe (45 mg) again 4 weeks later as loading doses. 1 mL 0   ??? ustekinumab (STELARA) 45 mg/0.5 mL Syrg syringe Inject 45 mg subcutaneous every 12 weeks 2 mL 0     No current facility-administered medications for this visit.       No Known Allergies    Patient Active Problem List   Diagnosis   ??? Cellulitis of right hand   ??? 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 Primary hypertension   ??? Gastroesophageal reflux disease   ??? Normocytic anemia   ??? Thrombocytosis   ??? Marijuana abuse   ??? 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 Psoriasis   ??? Diabetic polyneuropathy associated with type 2 diabetes mellitus (CMS-HCC)   ??? Substance abuse (CMS-HCC)   ??? Pure hypercholesterolemia   ??? Iron deficiency anemia   ??? History of skin graft   ??? History of colonic polyps   ??? Gastroparesis due to DM (CMS-HCC)   ??? Family history of colon cancer   ??? Hyperlipidemia associated with type 2 diabetes mellitus (CMS-HCC)       Reviewed and up to date in Epic.    Appropriateness of Therapy     Acute infections noted within Epic:  No active infections  Patient reported infection: None     Is medication and dose appropriate based on diagnosis and infection status? Yes    Prescription has been clinically reviewed: Yes      Baseline Quality of Life Assessment      How many days over the past month did your psoriasis  keep you from your normal activities? For example, brushing your teeth or getting up in the morning. Patient declined to answer    Financial Information     Medication Assistance provided: Prior Authorization    Anticipated copay of $9.85 reviewed with patient. Verified delivery address.    Delivery Information     Scheduled delivery date: Wed, 6/22    Expected start date: Wed, 6/22    Medication will be delivered via Same Day Courier to the prescription address in [facility].  This shipment will not require a signature.      Explained the services we provide at [facility] and that each month we would  call to set up refills.  Stressed importance of returning phone calls so that we could ensure they receive their medications in time each month.  Informed patient that we should be setting up refills 7-10 days prior to when they will run out of medication.  A pharmacist will reach out to perform a clinical assessment periodically.  Informed patient that a welcome packet, containing information about our pharmacy and other support services, a Notice of Privacy Practices, and a drug information handout will be sent.      The patient or caregiver noted above participated in the development of this care plan and knows that they can request review of or adjustments to the care plan at any time.      Patient or caregiver verbalized understanding of the above information as well as how to contact the pharmacy at [phone] option 4 with any questions/concerns.  The pharmacy is open Monday through Friday 8:30am-4:30pm.  A pharmacist is available 24/7 via pager to answer any clinical questions they may have.    Patient Specific Needs     - Does the patient have any physical, cognitive, or cultural barriers? No    - Does the patient have adequate living arrangements? (i.e. the ability to store and take their medication appropriately) Yes    - Did you identify any home environmental safety or security hazards? No    - Patient prefers to have medications discussed with  Patient     - Is the patient or caregiver able to read and understand education materials at a high school level or above? Yes    - Patient's primary language is  English     - Is the patient high risk? No    - Does the patient require physician intervention or other additional services (i.e. dietary/nutrition, smoking cessation, social work)? No      Nathyn Luiz A Desiree Lucy [facility] Specialty Pharmacist

## 2020-10-04 MED FILL — EMPTY CONTAINER: 120 days supply | Qty: 1 | Fill #0

## 2020-10-25 ENCOUNTER — Ambulatory Visit: Admit: 2020-10-25 | Discharge: 2020-10-26 | Payer: MEDICARE

## 2020-10-25 DIAGNOSIS — E11628 Type 2 diabetes mellitus with other skin complications: Principal | ICD-10-CM

## 2020-10-25 DIAGNOSIS — Z794 Long term (current) use of insulin: Principal | ICD-10-CM

## 2020-10-26 NOTE — Unmapped (Signed)
Jahmad is beginning to see some improvement with his psoriasis - he no longer has layers of scale, and he's had no new flares. His son helped with injection, and he has had no adverse effects.     Next load due 7/20, then first maintenance on 10/12.     [facility] Clinical Assessment & Refill Coordination Note    Ben Habermann, DOB: 09-08-1960  Phone: [phone] (home) [phone] (work)    All above HIPAA information was verified with patient.     Was a Nurse, learning disability used for this call? No    Specialty Medication(s):   Inflammatory Disorders: Stelara     Current Outpatient Medications   Medication Sig Dispense Refill   ??? atorvastatin (LIPITOR) 40 MG tablet Take 1 tablet (40 mg total) by mouth in the morning. 90 tablet 1   ??? betamethasone, augmented, (DIPROLENE) 0.05 % gel      ??? blood sugar diagnostic Strp Accu Check Aviva Strip, use 4 times per day for monitoring insulin dependant uncontrolled diabetes     ??? blood-glucose meter kit Use as instructed 1 each 0   ??? empagliflozin (JARDIANCE) 10 mg tablet Take 2 tablets (20 mg total) by mouth daily. 180 tablet 0   ??? empty container Misc Use as directed to dispose of Stelara syringes. 1 each 2   ??? flash glucose sensor (FREESTYLE LIBRE 14 DAY SENSOR) kit Apply new sensor every 14 days and use as directed to monitor and check blood sugars 2 each 12   ??? gabapentin (NEURONTIN) 800 MG tablet Take 1 tablet (800 mg total) by mouth Three (3) times a day. 270 tablet 1   ??? insulin ASPART (NOVOLOG) 100 unit/mL injection Inject 0.08 mL (8 Units total) under the skin Three (3) times a day before meals. 10 mL 0   ??? insulin detemir U-100 (LEVEMIR) 100 unit/mL (3 mL) injection pen Inject 0.5 mL (50 Units total) under the skin daily. 15 mL 0   ??? lisinopriL (PRINIVIL,ZESTRIL) 2.5 MG tablet Take 1 tablet (2.5 mg total) by mouth in the morning. 90 tablet 1   ??? triamcinolone (KENALOG) 0.1 % cream Apply topically Two (2) times a day. 80 g 0   ??? ustekinumab (STELARA) 45 mg/0.5 mL Syrg syringe Inject the contents of 1 syringe (45 mg) under the skin initially and THEN inject 1 syringe (45 mg) again 4 weeks later as loading doses. 1 mL 0   ??? ustekinumab (STELARA) 45 mg/0.5 mL Syrg syringe Inject 45 mg subcutaneous every 12 weeks 2 mL 0     No current facility-administered medications for this visit.        Changes to medications: Melquisedec reports no changes at this time.    No Known Allergies    Changes to allergies: No    SPECIALTY MEDICATION ADHERENCE     Stelara - 0 left  Medication Adherence    Patient reported X missed doses in the last month: 0  Specialty Medication: Stelara          Specialty medication(s) dose(s) confirmed: Regimen is correct and unchanged.     Are there any concerns with adherence? No    Adherence counseling provided? Not needed    CLINICAL MANAGEMENT AND INTERVENTION      Clinical Benefit Assessment:    Do you feel the medicine is effective or helping your condition? Yes    Clinical Benefit counseling provided? Not needed    Adverse Effects Assessment:    Are  you experiencing any side effects? No    Are you experiencing difficulty administering your medicine? No    Quality of Life Assessment:    Quality of Life    Rheumatology  On a scale of 1 - 10 with 1 representing not at all and 10 representing completely - how has your rheumatologic condition affected your:  Oncology  Dermatology  1.On a scale of 1-10, how itchy, sore, painful or stinging has your skin condition been within the last week?: 1  2.On a scale of 1-10, how embarrassed or self-conscious have you felt because of your skin within the last week?: 10  3.On a scale of 1-10, how much has your skin affected your social or leisure activities within the last week?: 1          Have you discussed this with your provider? Not needed    Acute Infection Status:    Acute infections noted within Epic:  No active infections  Patient reported infection: None    Therapy Appropriateness:    Is therapy appropriate? Yes, therapy is appropriate and should be continued    DISEASE/MEDICATION-SPECIFIC INFORMATION      For patients on injectable medications: Patient currently has 0 doses left.  Next injection is scheduled for 7/20.    PATIENT SPECIFIC NEEDS     - Does the patient have any physical, cognitive, or cultural barriers? No    - Is the patient high risk? No    - Does the patient require a Care Management Plan? No     - Does the patient require physician intervention or other additional services (i.e. nutrition, smoking cessation, social work)? No      SHIPPING     Specialty Medication(s) to be Shipped:   Inflammatory Disorders: Stelara    Other medication(s) to be shipped: No additional medications requested for fill at this time     Changes to insurance: No    Delivery Scheduled: Yes, Expected medication delivery date: Wed, 7/20.     Medication will be delivered via Same Day Courier to the confirmed prescription address in [facility].    The patient will receive a drug information handout for each medication shipped and additional FDA Medication Guides as required.  Verified that patient has previously received a Conservation officer, historic buildings and a Surveyor, mining.    The patient or caregiver noted above participated in the development of this care plan and knows that they can request review of or adjustments to the care plan at any time.      All of the patient's questions and concerns have been addressed.    Lanney Gins   [facility] Specialty Pharmacist

## 2020-11-01 MED FILL — STELARA 45 MG/0.5 ML SUBCUTANEOUS SYRINGE: 84 days supply | Qty: 0.5 | Fill #1

## 2020-11-17 NOTE — Unmapped (Signed)
PCP:  Herschell Dimes, FNP    11/17/2020  [id number]  [facility]  [address]  [name]  [place] [address]  Dept: [phone]  Loc: [phone]    Chief Complaint: Evaluation of proteinuria    HPI:  [name] is a 60 y.o.  male who presents for evaluation of proteinuria. 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     FH is positive for a cousin who required kidney transplant x2. The cause of renal disease is unknown. FH is also positive for DM2.     ROS:   CONSTITUTIONAL: denies fevers or chills, denies unintentional weight loss   CARDIOVASCULAR: denies chest pain, denies dyspnea on exertion, denies leg edema  GASTROINTESTINAL: denies nausea, denies vomiting, denies anorexia  GENITOURINARY: denies dysuria, denies hematuria, denies decreased urinary stream  All 10 systems reviewed and are negative except as listed above.    PAST MEDICAL HISTORY:  Past Medical History:   Diagnosis Date   ??? Diabetes mellitus (CMS-HCC)    ??? Hypertension    ??? Substance abuse (CMS-HCC)        ALLERGIES  Patient has no known allergies.    FAMILY HISTORY  Family History   Problem Relation Age of Onset   ??? Diabetes type II Mother    ??? Diabetes type II Father    ??? Diabetes type II Sister    ??? Diabetes type II Brother    ??? Diabetes type II Maternal Grandmother    ??? Diabetes type II Maternal Grandfather    ??? Diabetes type II Paternal Grandmother    ??? Diabetes type II Paternal Grandfather    ??? Melanoma Neg Hx    ??? Basal cell carcinoma Neg Hx    ??? Squamous cell carcinoma Neg Hx        SOCIAL HISTORY   reports that he has never smoked. He has never used smokeless tobacco. He reports current alcohol use. He reports current drug use. Drug: Marijuana.    MEDICATIONS:  Current Outpatient Medications   Medication Sig Dispense Refill   ??? atorvastatin (LIPITOR) 40 MG tablet Take 1 tablet (40 mg total) by mouth in the morning. 90 tablet 1   ??? betamethasone, augmented, (DIPROLENE) 0.05 % gel      ??? blood sugar diagnostic Strp Accu Check Aviva Strip, use 4 times per day for monitoring insulin dependant uncontrolled diabetes     ??? blood-glucose meter kit Use as instructed 1 each 0   ??? empagliflozin (JARDIANCE) 10 mg tablet Take 2 tablets (20 mg total) by mouth daily. 180 tablet 0   ??? empty container Misc Use as directed to dispose of Stelara syringes. 1 each 2   ??? flash glucose sensor (FREESTYLE LIBRE 14 DAY SENSOR) kit Apply new sensor every 14 days and use as directed to monitor and check blood sugars 2 each 12   ??? gabapentin (NEURONTIN) 800 MG tablet Take 1 tablet (800 mg total) by mouth Three (3) times a day. 270 tablet 1   ??? insulin ASPART (NOVOLOG) 100 unit/mL injection Inject 0.08 mL (8 Units total) under the skin Three (3) times a day before meals. 10 mL 0   ??? insulin detemir U-100 (LEVEMIR) 100 unit/mL (3 mL) injection pen Inject 0.5 mL (50 Units total) under the skin daily.  15 mL 0   ??? lisinopriL (PRINIVIL,ZESTRIL) 2.5 MG tablet Take 1 tablet (2.5 mg total) by mouth in the morning. 90 tablet 1   ??? triamcinolone (KENALOG) 0.1 % cream Apply topically Two (2) times a day. 80 g 0   ??? ustekinumab (STELARA) 45 mg/0.5 mL Syrg syringe Inject the contents of 1 syringe (45 mg) under the skin initially and THEN inject 1 syringe (45 mg) again 4 weeks later as loading doses. 1 mL 0   ??? ustekinumab (STELARA) 45 mg/0.5 mL Syrg syringe Inject 45 mg subcutaneous every 12 weeks 2 mL 0     No current facility-administered medications for this visit.       PHYSICAL EXAM:  Wt Readings from Last 3 Encounters:   10/25/20 92.3 kg (203 lb 8 oz)   09/28/20 92.5 kg (204 lb)   08/29/20 88.5 kg (195 lb)     Temp Readings from Last 3 Encounters:   10/25/20 36.3 ??C (97.3 ??F) (Temporal)   09/28/20 36.8 ??C (98.3 ??F) (Oral)   08/29/20 36.7 ??C (98.1 ??F) (Oral)     BP Readings from Last 5 Encounters:   10/25/20 132/73   09/28/20 134/66   08/29/20 102/60   04/24/20 170/91   04/13/20 90/63     Pulse Readings from Last 3 Encounters:   10/25/20 71   09/28/20 76   08/29/20 79       CONSTITUTIONAL: Alert,well appearing, no distress. Ambulates with a cane  HEENT: Moist mucous membranes, oropharynx clear without erythema or exudate  EYES: Extra ocular movements intact. Pupils reactive, sclerae anicteric.  NECK: Supple, no lymphadenopathy  CARDIOVASCULAR: Regular, normal S1/S2 heart sounds, no murmurs, no rubs.   PULM: Clear to auscultation bilaterally  GASTROINTESTINAL: Soft, active bowel sounds, nontender  EXTREMITIES: No lower extremity edema bilaterally.   SKIN: No rashes or lesions  NEUROLOGIC: No focal motor or sensory deficits    MEDICAL DECISION MAKING  Urinalysis performed in office revealed specific gravity 1.015 with pH of 6 and was negative for leukocyte esterase, nitrite, urobilinogen, protein, blood, ketone, bilirubin or glucose.     Creatinine   Date Value Ref Range Status   08/29/2020 0.96 0.60 - 1.10 mg/dL Final   10/96/0454 1.0 0.5 - 1.4 mg/dL Final   09/81/1914 0.9 0.5 - 1.4 mg/dL Final   78/29/5621 0.8 0.5 - 1.4 mg/dL Final   30/86/5784 1.1 0.5 - 1.4 mg/dL Final   69/62/9528 1.0 0.5 - 1.4 mg/dL Final   41/32/4401 1.2 0.5 - 1.4 mg/dL Final   02/72/5366 1.1 0.5 - 1.4 mg/dL Final   44/06/4740 1.0 0.5 - 1.4 mg/dL Final   59/56/3875 0.8 0.5 - 1.4 mg/dL Final   64/33/2951 0.9 0.5 - 1.4 mg/dL Final        Lab Results   Component Value Date    NA 139 08/29/2020    K 4.9 08/29/2020    CL 105 08/29/2020    CO2 26.0 08/29/2020    BUN 32 (H) 08/29/2020    CREATININE 0.96 08/29/2020    GLU 145 (H) 08/29/2020    CALCIUM 9.9 08/29/2020    ALBUMIN 3.7 08/29/2020    PHOS 3.4 04/05/2019       Lab Results   Component Value Date    CALCIUM 9.9 08/29/2020       Lab Results   Component Value Date    BILITOT 0.5 08/29/2020    BILIDIR 0.40 06/01/2018    PROT 7.0 08/29/2020    ALBUMIN 3.7  08/29/2020    ALT 23 08/29/2020    AST 26 08/29/2020    ALKPHOS 81 08/29/2020    GGT 1,007 (H) 05/28/2018     Lab Results   Component Value Date    INR 1.0 05/01/2019    APTT 24.2 (L) 05/01/2019       HGB   Date Value Ref Range Status   08/29/2020 14.4 12.9 - 16.5 g/dL Final   16/01/9603 54.0 (L) 14.0 - 18.0 g/dL Final   98/02/9146 82.9 (L) 14.0 - 18.0 g/dL Final   56/21/3086 57.8 (L) 14.0 - 18.0 g/dL Final   46/96/2952 84.1 (L) 14.0 - 18.0 g/dL Final   32/44/0102 72.5 (L) 14.0 - 18.0 g/dL Final   36/64/4034 74.2 (L) 14.0 - 18.0 g/dL Final   59/56/3875 64.3 (L) 14.0 - 18.0 g/dL Final   32/95/1884 16.6 (L) 14.0 - 18.0 g/dL Final   10/13/1599 09.3 (L) 14.0 - 18.0 g/dL Final   23/55/7322 02.5 (L) 14.0 - 18.0 g/dL Final        Lab Results   Component Value Date    WBC 9.0 08/29/2020    HGB 14.4 08/29/2020    HCT 44.1 08/29/2020    PLT 162 08/29/2020     Lab Results   Component Value Date    IRON 90 01/27/2020    TIBC 319 01/27/2020    FERRITIN 47.4 01/27/2020       Lab Results   Component Value Date    A1C 7.4 (A) 08/29/2020    A1C 9.6 (A) 01/27/2020    A1C 14.2 (H) 03/27/2019       Lab Results   Component Value Date    Color, UA Yellow 03/30/2019    Glucose, UA Trace (A) 03/30/2019    Ketones, UA Negative 03/30/2019    Blood, UA Trace (A) 03/30/2019    Nitrite, UA Negative 03/30/2019   ]    Lab Results   Component Value Date    CREATUR 90.0 09/28/2020     No results found for: PCRATIOUR    Lab Results   Component Value Date    CHOL 141 08/29/2020    A1C 7.4 (A) 08/29/2020    ANA Negative 05/27/2018    HEPAIGG Nonreactive 04/22/2018    HEPBCAB Nonreactive 09/26/2020    HEPCAB Nonreactive 09/26/2020       PVL ABI  Narrative: This is a summary report. The complete report is available in the patient's medical record. If you cannot access the medical record, please contact the sending organization for a detailed fax or copy.    INTERPRETATION:  Pulse volume recording with Doppler pressures reveals a pressure of 225 mmHg at the right ankle PT for an index of 1.17 and 191 mmHg at the right ankle DP for an index of .99.  Pulse volume recording with Doppler pressures reveals a pressure of 230 mmHg at the left ankle PT for an index of 1.20 and 222 mmHg at the left ankle DP for an index of 1.16.    High brachial pressures noted of 181 mmHg on the right and 192 mmHg on the left.  Pressures repeated for accuracy. Patient states he drank 2 cups of coffee before test.    Examination of pressures reveals no significant fall-off of mmHg between the high thigh and ankle on the right side.  On the left side, there is no significant fall-off of mmHg between high thigh and ankle.    Examination revealed triphasic waveforms at the  right upper thigh to ankle.  On the left side, triphasic waveforms noted from thigh to ankle.    Digital waveforms were triphasic and digital pressures were abnormal on the right.  Digital waveforms were triphasic and digital pressures were normal on the left.    Toenails appear normal in thickness and color.    No treadmill ordered.    No previous study to compare to.    FINAL   Impression:     Normal resting study.  Occlusive arterial disease does not appear to be present at rest.  Treadmill testing is recommended if claudication is suspected.       ASSESSMENT/PLAN:    Mr.Cory Bentley is a 60 y.o. patient with a past medical history significant for DM2 w/ DR who is being seen for evaluation of proteinuria.     1. Chronic Kidney Disease Stage 1 - the patient has preserved renal function however he is developing proteinuria which is an indicator of the onset of renal disease. At this time, I urged him to maintain stringent glucose control. He is on Jardiance and lisinopril. If possible, we can try to increase the dose of lisinopril. Continue to check BMP and Urine pro/cr ratio before each visit.     2. HTN - controlled     3. 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 Ideally, I would like him to be <7%. Defer to PCP for management.     Mr.Argel Wohlers will follow up in  3 month(s) with the following labs within 1 weeks of their next visit: RFP, Urine pro/creat ratio      Disclaimer: Much of the narrative of this dictation was acquired using speech recognition software. It is possible that some dictated speech was not transcribed accurately by this system nor detected in the proofing. Such errors are at times unavoidable.

## 2020-11-27 DIAGNOSIS — Z794 Long term (current) use of insulin: Principal | ICD-10-CM

## 2020-11-27 DIAGNOSIS — E11628 Type 2 diabetes mellitus with other skin complications: Principal | ICD-10-CM

## 2020-11-27 MED ORDER — INSULIN DETEMIR (U-100) 100 UNIT/ML (3 ML) SUBCUTANEOUS PEN
Freq: Every day | SUBCUTANEOUS | 0 refills | 30 days | Status: CP
Start: 2020-11-27 — End: 2022-04-30

## 2020-11-27 NOTE — Unmapped (Signed)
2Patient is requesting the following refill  Requested Prescriptions     Pending Prescriptions Disp Refills   ??? insulin detemir U-100 (LEVEMIR) 100 unit/mL (3 mL) injection pen 15 mL 0     Sig: Inject 0.5 mL (50 Units total) under the skin daily.       Recent Visits  Date Type Provider Dept   09/28/20 Office Visit Amie Portland, FNP [facility]   08/29/20 Office Visit Amie Portland, FNP [facility]   Showing recent visits within past 365 days with a meds authorizing provider and meeting all other requirements  Future Appointments  Date Type Provider Dept   11/30/20 Appointment Amie Portland, FNP [place]   Showing future appointments within next 365 days with a meds authorizing provider and meeting all other requirements       Labs:   A1c:   Hemoglobin A1C (%)   Date Value   08/29/2020 7.4 (A)   03/27/2019 14.2 (H)

## 2020-11-28 NOTE — Unmapped (Signed)
AWV Scheduled w/ Ara Sappenfield, RN on 11/30/20    Abstraction Result Flowsheet Data    This patient's last AWV date: [facility] Last Medicare Wellness Visit Date: Not Found  This patients last WCC/CPE date: : Not Found      Reason for Encounter  Reason for Encounter: Outreach  Primary Reason for Outreach: [employer] Call Outcome: Scheduled  Text Message: No                Dear Cory Bentley,    [facility] is committed to helping you stay healthy. Our records show you are due for a Medicare Annual Wellness Visit.    A benefit for anyone with Medicare, this visit is designed to help prevent illness based on your current health and risk factors--at no cost to you.     An Annual Wellness Visit may include education or counseling about immunizations, and important health measurements such as blood pressure checks, screenings, and referrals for other care if needed.    Please reply to this message with the word ???SCHEDULE,??? and we will contact you to schedule your appointment.    Thank you for the opportunity to care for you.    Herschell Dimes, FNP  [facility]

## 2020-11-28 NOTE — Unmapped (Addendum)
Thank you for completing your Medicare Annual Wellness Visit today. Please see below educational materials on health topics specific to you as well as resources discussed during today's visit. If you have any questions or concerns after today's visit, please reach out to our care manager, Beatriz Stallion, RN, via [place] or at [phone].     Please follow the table below when deciding who to contact with medical concerns:  Your doctor  [facility]  [facility] Emergency Department   Best Reason to go:  Your doctor knows your health history and has the lowest cost for services.    Call first for advice, even after hours.    How they help:    Can treat most minor illnesses/injuries, often the same day.  Can tell you when you need emergency care.    Best Reason to go:  Has shorter wait time and lower cost than Emergency Department.    Examples of when to go:  Allergic reaction  Asthma attack (minor)  Colds, cough, flu, fever, nausea  Dizziness  Ear infection  Migraine  Minor cuts  Rash  Sprain or strain  Urinary tract infection    Best Reason to go:  You need quick access for a life-threatening condition.    Examples of when to go:  Chest pain  Broken bone  Fainting  Major head injury  Palpitations  Seizures  Severe abdominal pain  Severe asthma attack  Shortness of breath  Unconsciousness  Bleeding you cannot stop     For access to 24/7 nurse advice for non-emergent medical problems, please call [facility] at [phone].         Here is your personalized prevention plan based on your Annual Wellness Visit today.    Medicare Screening & Prevention Guidelines Recommendations Dates Completed HM Status and Next Due Follow-Up   AAA Ultrasound Once in men age 64 to 47 who have ever smoked or currently smoke. AAA screening date: Not Found Health Maintenance Summary     This patient has no relevant Health Maintenance data.       Not within age range   Colorectal Cancer Screening Patients 50 to 75: stool cards annually OR colonoscopy every 10 years (or more frequently if high risk) OR FIT-DNA every 3 years.  Colonoscopy date: Not Found  FOBT/FIT date: Not Found  Sigmoidoscopy date: Not Found  FIT-DNA date: Not Found Health Maintenance Summary    -      Overdue - FOBT/FIT (Yearly) Overdue - never done    No completion history exists for this topic.          Overdue - Colonoscopy (Every 10 Years) Overdue - never done    No completion history exists for this topic.             Up to date - per pt, last done in [place] at [place] 1 year ago. Records requested.    DEXA Bone Density Measurement Patients age 28-95 to have a Dexa every 5 years in postmenopausal women and high risk males. Males will defer to PCP. DEXA date: Not Found Health Maintenance Summary     This patient has no relevant Health Maintenance data.       Defer to PCP   Diabetes Eye Exam Annually if Diabetic Eye Exam date: 11/10/2020 Health Maintenance Summary    -      Retinal Eye Exam (Yearly) Next due on 11/10/2021    11/10/2020  HM Diabetic Eye Exam component of  HM DIABETES EYE EXAM            Up to date - next due 11/10/2021   Diabetes Foot Exam Annually if Diabetic. Most Recent Foot Exam Date: 08/29/2020 Health Maintenance Summary    -      Foot Exam (Yearly) Next due on 08/29/2021    08/29/2020  [employer]: WORKFLOW - DIABETES - DIABETIC FOOT EXAM PERFORMED              Up to date   Diabetes urine Albumin/Creatinine Ratio Annually if Diabetic UACR Date: 09/28/2020 Health Maintenance Summary    -      Urine Albumin/Creatinine Ratio (Yearly) Next due on 09/28/2021    09/28/2020  Albumin/Creatinine Ratio component of Albumin/creatinine   urine ratio     09/28/2020  Registry Metric: [facility] DM LAST ALBQTUR     08/29/2020  Albumin/Creatinine Ratio component of Albumin/creatinine   urine ratio     05/05/2019  Microalbumin/Creatinine Ratio component of Microalbumin Qt,   Random Urine              Up to date   Diabetes Hemoglobin A1c Every 3 or 6 months depending on last result Last Hemoglobin A1c Date: 08/29/2020 Health Maintenance Summary    -      Hemoglobin A1c (Every 6 Months) Next due on 03/01/2021    08/29/2020  HGB A1C, RAP/PDS component of POCT glycosylated hemoglobin   (Hb A1C)     08/29/2020  Registry Metric: Last Hemoglobin A1c Date     01/27/2020  HGB A1C, RAP/PDS component of HGB A1C, POC     03/27/2019  Hemoglobin A1c component of Hemoglobin A1c     12/03/2018  HGB A1C, RAP/PDS component of HGB A1C, POC     Only the first 5 history entries have been loaded, but more history   exists.           Up to date   Heart Disease Screening (fasting lipid panel) Minimum of every 5 years, patients age 43-75,  if no apparent signs or symptoms of heart disease. LDL date: 08/29/2020  Total choleseterol date: 08/29/2020  HDL date: 08/29/2020  Triglycerides date: 08/29/2020 Health Maintenance Summary     This patient has no relevant Health Maintenance data.       Up to date   Hepatitis C Screening A one-time screening for HCV infection for adults age 56 - 71 years old. HCV screening date: 09/26/2020 Health Maintenance Summary    -      Hepatitis C Screen  Completed    09/26/2020  Hepatitis C Ab component of Hepatitis C Antibody     05/27/2018  Hepatitis C Ab component of Hepatitis C Antibody     04/22/2018  Hepatitis C Ab component of Hepatitis C Antibody              Complete   Covid-19 Vaccine For persons 5 and older @[id number] @ Health Maintenance   Topic Date Due    COVID-19 Vaccine (3 - Pfizer risk series) 10/22/2019        Booster due    Tdap Every 10 years (will not be covered by Medicare) DTap/Tdap/TD vaccination: 09/28/2020 Health Maintenance Summary    -      DTaP/Tdap/Td Vaccines (3 - Td or Tdap) Next due on 09/29/2030    09/28/2020  Imm Admin: TdaP     11/22/1996  Imm Admin: DTP            Up to date -  next due 09/29/2030   Influenza Vaccine Annually  Influenza Vaccination: 03/29/2019   Health Maintenance Summary    -      Influenza Vaccine (1) Next due on 12/14/2020    03/29/2019  Imm Admin: Influenza Virus Vaccine, unspecified formulation     04/03/2018  Imm Admin: Influenza Virus Vaccine, unspecified formulation     03/13/2018  Imm Admin: Influenza Nasal, Unspecified Formulation     03/28/2017  Imm Admin: Influenza Virus Vaccine, unspecified formulation     03/09/2014  Imm Admin: INFLUENZA TIV (TRI) 55MO+ W/ PRESERV (IM)              Up to date   Pneumococcal Vaccine For people ?65, or with an underlying condition Pneumonia vaccination: Not Found   Health Maintenance Summary    -      Overdue - Pneumococcal Vaccine 0-64 (1 - PCV) Overdue - never done    No completion history exists for this topic.             Not within age range   Zoster Vaccine Healthy adults 50 years and older receive 2 doses of recombinant zoster vaccine two to six months apart (may not be covered by Medicare).  Zoster vaccination: Not Found Health Maintenance Summary    -      Overdue - Zoster Vaccines (1 of 2) Overdue - never done    No completion history exists for this topic.           Provided information on how to obtain at pharmacy (shingrix)      Patient Education        Food as Fuel: Care Instructions  Your Care Instructions     A healthy, balanced diet gives your body nutrients. Nutrients are like fuel for your body. They give you energy. And they keep your heart beating, your brain active, and your muscles working. They also help to build and strengthen bones, muscles, and other body tissues.  Your body needs three major nutrients for energy. These are carbohydrate, protein, and fat.  Carbohydrate provides energy for your brain, muscles, heart, and lungs. It is found in bread, cereal, rice, pasta, fruits, vegetables, milk, yogurt, and sugar.  Protein provides energy and helps build and repair your body's cells. It is found in meat, poultry, fish, cooked dry beans, cheese, tofu and other soy products, nuts and seeds, and milk and milk products.  Fat provides energy, helps build the covering around nerves in your body, and helps make hormones. Fat is found in butter, margarine, oil, mayonnaise, salad dressing, and nuts. It is also found in most foods that come from animals, such as meat and milk products. Many foods also have fat added to them.  Your body needs all three of these nutrients to be healthy. If you choose a good mix of foods, you can help your body get the right amounts of carbohydrate, protein, and fat. It can also keep you at a healthy weight.  Follow-up care is a key part of your treatment and safety. Be sure to make and go to all appointments, and call your doctor if you are having problems. It's also a good idea to know your test results and keep a list of the medicines you take.  How can you care for yourself at home?  Eat a balanced diet  Try to eat variety of healthy foods every day. These include:  5 to 8 ounces of bread, cereal, crackers, rice, or pasta. An  ounce is about 1 slice of bread, 1 cup of breakfast cereal, or ?? cup of cooked rice, cereal, or pasta. Choose whole-grain products for at least half of your grain servings.  2 to 3 cups of vegetables. Be sure to include:  Dark green vegetables such as broccoli and spinach.  Orange vegetables such as carrots and sweet potatoes.  1?? to 2 cups of fresh, frozen, or canned fruit. A large banana, a large orange, or a small apple equals 1 cup.  3 cups of nonfat or low-fat milk, yogurt, or other milk products.  5 to 6?? ounces of protein foods. These include chicken, fish, lean meat, beans, nuts, and seeds. One egg equals 1 ounce of meat, poultry, or fish. A ?? cup of cooked beans equals 2 ounces of protein.  Stay fueled all day  Start your day with breakfast. If you don't have time to sit down for a bowl of cereal in the morning, try something that you can eat on the go. Try a piece of whole wheat bread with peanut butter or a container of yogurt with frozen berries mixed in.  Eat regularly scheduled meals and snacks. If you miss a meal, you may overeat at the next meal. Or you may choose a less healthy snack.  Drink enough water. (If you have kidney, heart, or liver disease and have to limit fluids, talk with your doctor before you increase your fluid intake.)  Where can you learn more?  Go to [facility] at [email or web address] in the Menu. Enter [id number] in the search box to learn more about Food as Fuel: Care Instructions.  Current as of: December 22, 2019               Content Version: 13.3  ?? 2006-2022 [employer].   Care instructions adapted under license by [facility]. If you have questions about a medical condition or this instruction, always ask your healthcare professional. [employer] Inc[employer] disclaims any warranty or liability for your use of this information.       Patient Education        Learning About Being Physically Active  What is physical activity?     Being physically active means doing any kind of activity that gets your body moving.  The types of physical activity that can help you get fit and stay healthy include:  Aerobic or cardio activities. These make your heart beat faster and make you breathe harder, such as brisk walking, riding a bike, or running. They strengthen your heart and lungs and build up your endurance.  Strength training activities. These make your muscles work against, or resist, something. Examples include lifting weights or doing push-ups. These activities help tone and strengthen your muscles and bones.  Stretches. These let you move your joints and muscles through their full range of motion. Stretching helps you be more flexible.  Reaching a balance between these three types of physical activity is important because each one contributes to your overall fitness.  What are the benefits of being active?  Being active is one of the best things you can do for your health. It helps you to:  Feel stronger and have more energy to do all the things you like to do.  Focus better at school or work.  Feel, think, and sleep better.  Reach and stay at a healthy weight.  Lose fat and build lean muscle.  Lower your risk for serious health problems, including diabetes, heart  attack, high blood pressure, and some cancers.  Keep your heart, lungs, bones, muscles, and joints strong and healthy.  How can you make being active part of your life?  Start slowly. Make it your long-term goal to get at least 30 minutes of exercise on most days of the week. Walking is a good choice. You also may want to do other activities, such as running, swimming, cycling, or playing tennis or team sports.  Pick activities that you like--ones that make your heart beat faster, your muscles stronger, and your muscles and joints more flexible. If you find more than one thing you like doing, do them all. You don't have to do the same thing every day.  Get your heart pumping every day. Any activity that makes your heart beat faster and keeps it at that rate for a while counts.  Here are some great ways to get your heart beating faster:  Go for a brisk walk, run, or bike ride.  Go for a hike or swim.  Go in-line skating.  Play a game of touch football, basketball, or soccer.  Ride a bike.  Play tennis or racquetball.  Climb stairs.  Even some household chores can be aerobic--just do them at a faster pace. Vacuuming, raking or mowing the lawn, sweeping the garage, and washing and waxing the car all can help get your heart rate up.  Strengthen your muscles during the week. You don't have to lift heavy weights or grow big, bulky muscles to get stronger. Doing a few simple activities that make your muscles work against, or resist, something can help you get stronger.  For example, you can:  Do push-ups or sit-ups, which use your own body weight as resistance.  Lift weights or dumbbells or use stretch bands at home or in a gym or community center.  Stretch your muscles often. Stretching will help you as you become more active. It can help you stay flexible, loosen tight muscles, and avoid injury. It can also help improve your balance and posture and can be a great way to relax.  Be sure to stretch the muscles you'll be using when you work out. It's best to warm your muscles slightly before you stretch them. Walk or do some other light aerobic activity for a few minutes, and then start stretching.  When you stretch your muscles:  Do it slowly. Stretching is not about going fast or making sudden movements.  Don't push or bounce during a stretch.  Hold each stretch for at least 15 to 30 seconds, if you can. You should feel a stretch in the muscle, but not pain.  Breathe out as you do the stretch. Then breathe in as you hold the stretch. Don't hold your breath.  If you're worried about how more activity might affect your health, have a checkup before you start. Follow any special advice your doctor gives you for getting a smart start.  Where can you learn more?  Go to [facility] at [email or web address] in the Menu. Enter [phone] in the search box to learn more about Learning About Being Physically Active.  Current as of: May 10, 2020               Content Version: 13.3  ?? 2006-2022 [employer], [employer].   Care instructions adapted under license by [facility]. If you have questions about a medical condition or this instruction, always ask your healthcare professional. [employer] disclaims any warranty or liability  for your use of this information.       Patient Education        Preventing Falls: Care Instructions  Your Care Instructions     Getting around your home safely can be a challenge if you have injuries or health problems that make it easy for you to fall. Loose rugs and furniture in walkways are among the dangers for many older people who have problems walking or who have poor eyesight. People who have conditions such as arthritis, osteoporosis, or dementia also have to be careful not to fall.  You can make your home safer with a few simple measures.  Follow-up care is a key part of your treatment and safety. Be sure to make and go to all appointments, and call your doctor if you are having problems. It's also a good idea to know your test results and keep a list of the medicines you take.  How can you care for yourself at home?  Taking care of yourself  Exercise regularly to improve your strength, muscle tone, and balance. Walk if you can. Swimming may be a good choice if you cannot walk easily.  Have your vision and hearing checked each year or any time you notice a change. If you have trouble seeing and hearing, you might not be able to avoid objects and could lose your balance.  Know the side effects of the medicines you take. Ask your doctor or pharmacist whether the medicines you take can affect your balance. Sleeping pills or sedatives can affect your balance.  Limit the amount of alcohol you drink. Alcohol can impair your balance and other senses.  Ask your doctor whether calluses or corns on your feet need to be removed. If you wear loose-fitting shoes because of calluses or corns, you can lose your balance and fall.  Talk to your doctor if you have numbness in your feet.  You may get dizzy if you do not drink enough water. To prevent dehydration, drink plenty of fluids. Choose water and other clear liquids. If you have kidney, heart, or liver disease and have to limit fluids, talk with your doctor before you increase the amount of fluids you drink.  Preventing falls at home  Remove raised doorway thresholds, throw rugs, and clutter. Repair loose carpet or raised areas in the floor.  Move furniture and electrical cords to keep them out of walking paths.  Use nonskid floor wax, and wipe up spills right away, especially on ceramic tile floors.  If you use a walker or cane, put rubber tips on it. If you use crutches, clean the bottoms of them regularly with an abrasive pad, such as steel wool.  Keep your house well lit, especially stairways, porches, and outside walkways. Use night-lights in areas such as hallways and bathrooms. Add extra light switches or use remote switches (such as switches that go on or off when you clap your hands) to make it easier to turn lights on if you have to get up during the night.  Install sturdy handrails on stairways.  Move items in your cabinets so that the things you use a lot are on the lower shelves (about waist level).  Keep a cordless phone and a flashlight with new batteries by your bed. If possible, put a phone in each of the main rooms of your house, or carry a cell phone in case you fall and cannot reach a phone. Or, you can wear a device around your neck or  wrist. You push a button that sends a signal for help.  Wear low-heeled shoes that fit well and give your feet good support. Use footwear with nonskid soles. Check the heels and soles of your shoes for wear. Repair or replace worn heels or soles.  Do not wear socks without shoes on wood floors.  Walk on the grass when the sidewalks are slippery. If you live in an area that gets snow and ice in the winter, sprinkle salt on slippery steps and sidewalks. Or ask a family member or friend to do this for you.  Preventing falls in the bath  Install grab bars and nonskid mats inside and outside your shower or tub and near the toilet and sinks.  Use shower chairs and bath benches.  Use a hand-held shower head that will allow you to sit while showering.  Get into a tub or shower by putting the weaker leg in first. Get out of a tub or shower with your strong side first.  Repair loose toilet seats and consider installing a raised toilet seat to make getting on and off the toilet easier.  Keep your bathroom door unlocked while you are in the shower.  Where can you learn more?  Go to [facility] at [email or web address] [email or web address]/[email or web address] in the Menu. Enter G117 in the search box to learn more about Preventing Falls: Care Instructions.  Current as of: December 22, 2019               Content Version: 13.3  ?? 2006-2022 [employer], [employer].   Care instructions adapted under license by [facility]. If you have questions about a medical condition or this instruction, always ask your healthcare professional. [employer] disclaims any warranty or liability for your use of this information.

## 2020-11-30 ENCOUNTER — Ambulatory Visit: Admit: 2020-11-30 | Discharge: 2020-12-01 | Payer: MEDICARE

## 2020-11-30 DIAGNOSIS — E1142 Type 2 diabetes mellitus with diabetic polyneuropathy: Principal | ICD-10-CM

## 2020-11-30 DIAGNOSIS — E1169 Type 2 diabetes mellitus with other specified complication: Principal | ICD-10-CM

## 2020-11-30 DIAGNOSIS — I10 Essential (primary) hypertension: Principal | ICD-10-CM

## 2020-11-30 DIAGNOSIS — E11628 Type 2 diabetes mellitus with other skin complications: Principal | ICD-10-CM

## 2020-11-30 DIAGNOSIS — E785 Hyperlipidemia, unspecified: Principal | ICD-10-CM

## 2020-11-30 DIAGNOSIS — Z794 Long term (current) use of insulin: Principal | ICD-10-CM

## 2020-11-30 DIAGNOSIS — Z0000 Encounter for general adult medical examination without abnormal findings: Principal | ICD-10-CM

## 2020-11-30 MED ORDER — INSULIN ASPART U-100  100 UNIT/ML SUBCUTANEOUS SOLUTION
Freq: Three times a day (TID) | SUBCUTANEOUS | 1 refills | 34.00000 days | Status: CP
Start: 2020-11-30 — End: 2020-11-30

## 2020-11-30 MED ORDER — INSULIN DETEMIR (U-100) 100 UNIT/ML (3 ML) SUBCUTANEOUS PEN
Freq: Every day | SUBCUTANEOUS | 1 refills | 28.00000 days | Status: CP
Start: 2020-11-30 — End: 2022-05-03

## 2020-11-30 MED ORDER — EMPAGLIFLOZIN 10 MG TABLET
ORAL_TABLET | Freq: Every day | ORAL | 1 refills | 90 days | Status: CP
Start: 2020-11-30 — End: ?

## 2020-11-30 NOTE — Unmapped (Signed)
This auto-generated note displays some results identified during the AWV Assessments. For full results, please see the Flowsheet Links under the Additional Documentation section of this encounter in Chart Review.          Risks Identified:  BMI Abnormal and/or patient would benefit from meeting with an RD for Chronic Condition Management, Dental issues, Falls Risk, Pain and Visual concerns, Substance use    -Substance use: Marijuana daily. H/o cocaine abuse.   -BMI 30: Reports working on exercising as often as he can - uses mainly a stationary bike. Reports gaining a little bit of weight but has worked hard on changing his diet to help manage DM. No RD referral today. Nutrition and exercise info in AVS.   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 Has a top partial from a lost permanent tooth as a child.   -Falls risk: Unsteady due to neuropathy. Uses cane. Reports a fall in the past few months.   -Pain: Pain in hands and feet due to neuropathy. Reports that gabapentin and marijuana helps. Marijuana use daily. Wants to avoid opioids.   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 No glasses. Is able to drive.   *States health is fair due to diabetes and neuropathy.       Social Determinants of Health:  Social Determinants of Health Screened today.  Interventions Provided: N/A; No issues identified.    PCP notified of above risks by  Shared visit with PCP today     The following list of current providers and suppliers reviewed and updated this visit.  Patient Care Team:  Amie Portland, FNP as PCP - General (Family Medicine)  Amie Portland, FNP as PCP - General-ATTRIBUTED  Lucy Dreama Saa, ANP as Nurse Practitioner (Nephrology)  Abbie Sons, MD (Dermatology)  Loletha Carrow (Ophthalmology)    Medications and supplements were reviewed and updated this visit. See medication list in encounter summary.     A personalized prevent plan was updated and reviewed with the patient. A copy has been provided to the patient in Patient Instructions.    Recent Hospitalizations reviewed:  No recent hospitalizations     General Health:  Patient answered (!) Fair (DM, neuropathy) to self health assessment  wellness information provided in AVS    Patient answered Fair (needs dental work - upper implant) to oral health assessment  does not have a dentist and community resources on dentist provided       Patient's BMI is 30.87 Patient answered No to nutrition services referral: Provided information in AVS on: nutrition, exercise        Pain identified during today's visit        11/30/20 1051   PainSc: 0-No pain          Safety:   The patient answered (!) Yes to falling in the last year, and (!) Yes to feeling unsteady while standing or walking.  Falls Assessment Review    Fall Risk Assessment was positive.  Falls risk interventions taken today were:  ?? Discussed interventions in home e.g. Shower chairs, grab bars, no loose rugs in the home  ?? Medications were reviewed  ?? Vision checked in the last year   ?? Cane            Patient answered ADL/IADL's:  Dressing: Independent    Grooming: Independent   Feeding: Independent    Bathing: Independent    Toileting: Independent  Household Duties Independent      Continence Continent            Psychosocial Assessment:     Patient answered one or more psychosocial assessments abnormally  Do you feel overwhelmed as a caregiver? Not Applicable   Do you express feelings of anger and frustration in ways that are hurtful to yourself or others? Never or Almost Never    Do you feel generally tired or fatigue? (!) Often        PHQ 2:  Patient had a PHQ 2 score of 0    PHQ 9: N/A    No intervention necessary Banker)    Social Determinants of Health     Tobacco Use: Low Risk    ??? Smoking Tobacco Use: Never Smoker   ??? Smokeless Tobacco Use: Never Used   Alcohol Use: Not At Risk   ??? How often do you have a drink containing alcohol?: 4+ times per week   ??? How many drinks containing alcohol do you have on a typical day when you are drinking?: 1 - 2   ??? How often do you have 5 or more drinks on one occasion?: Never   Financial Resource Strain: Low Risk    ??? Difficulty of Paying Living Expenses: Not very hard   Food Insecurity: No Food Insecurity   ??? Worried About Running Out of Food in the Last Year: Never true   ??? Ran Out of Food in the Last Year: Never true   Transportation Needs: No Transportation Needs   ??? Lack of Transportation (Medical): No   ??? Lack of Transportation (Non-Medical): No   Physical Activity: Insufficiently Active   ??? Days of Exercise per Week: 3 days   ??? Minutes of Exercise per Session: 40 min   Stress: No Stress Concern Present   ??? Feeling of Stress : Only a little   Social Connections: Unknown   ??? Frequency of Communication with Friends and Family: More than three times a week   ??? Frequency of Social Gatherings with Friends and Family: More than three times a week   ??? Attends Religious Services: Patient refused   ??? Active Member of Clubs or Organizations: Patient refused   ??? Attends Banker Meetings: Patient refused   ??? Marital Status: Divorced   Catering manager Violence: Not At Risk   ??? Fear of Current or Ex-Partner: No   ??? Emotionally Abused: No   ??? Physically Abused: No   ??? Sexually Abused: No   Depression: Not at risk   ??? PHQ-2 Score: 0   Housing/Utilities: Low Risk    ??? Within the past 12 months, have you ever stayed: outside, in a car, in a tent, in an overnight shelter, or temporarily in someone else's home (i.e. couch-surfing)?: No   ??? Are you worried about losing your housing?: No   ??? Within the past 12 months, have you been unable to get utilities (heat, electricity) when it was really needed?: No   Substance Use: Low Risk    ??? Taken prescription drugs for non-medical reasons: Never   ??? Taken illegal drugs: Never   ??? Patient indicated they have taken drugs in the past year for non-medical reasons: Yes, [positive answer(s)]: Not on file   Health Literacy: Low Risk    ??? : Never

## 2020-11-30 NOTE — Unmapped (Signed)
ADVANCE CARE PLANNING NOTE    Discussion Date:  November 30, 2020    Patient has decisional capacity:  Yes    Patient has selected a Health Care Decision-Maker if loses capacity: Yes    Health Care Decision Maker as of 11/30/2020    HCDM (patient stated preference): Cory Bentley, Cory Bentley - Son - [phone]    HCDM (patient stated preference): Cory Bentley, Cory Bentley - [phone]    Discussion Participants:  Patient, CM    Communication of Medical Status/Prognosis:   Patient states health is fair related to diabetes and neuropathy.     Communication of Treatment Goals/Options:   Patient does not currently have any ACP documents on file. Pt declined ACP discussion/education today. Will reach out to CM for resources when desired. HCDM verified and updated.     Treatment Decisions:   11/30/2020    Herschell Dimes, FNP was present and immediately available in office suite.    The patient reports they decline information about Healthcare power of attorney Living Will today..                        I spent 5 minutes providing voluntary advance care planning services for this patient.

## 2020-11-30 NOTE — Unmapped (Signed)
Assessment and Plan:     Cory Bentley was seen today for follow-up and subsequent awv.    Diagnoses and all orders for this visit:    Type 2 diabetes mellitus with other skin complication, with long-term current use of insulin (CMS-HCC)    Pt A1c today is 7.7 - This is up from 7.4 in May 2022    On review of diet he has increased steak intake, eat mac n' cheese freq, uses tortilla in a lot of meals, and snacks on chips and cheetos  Discussed the importance of diet with lean meats, low carb, low sugar, and healthy portion sizes  He verbalizes that he understands but does not seem vested in POC     He brings with him his home Libre readings and the average is 145 over the past several days     He is currently on Novolog 8u with meals - Will increase to 10u with meals  He is also on Levemir 50u nightly - Will increase to 52u nightly   He reports that he is now taking his Jardiance 20mg  daily (which he was not doing at last visit)  Discussed goal for blood glucose is 80-110    Pt albumin/creatinie ration was elevated at 492.2 at last OV and he was referred to nephrology   He had a visit 10/25/2020 and has a follow up 01/03/2021 (he did not remember this appt until reminded today)    -     POCT glycosylated hemoglobin (Hb A1C)  -     empagliflozin (JARDIANCE) 10 mg tablet; Take 2 tablets (20 mg total) by mouth daily.  -     Discontinue: insulin ASPART (NOVOLOG) 100 unit/mL injection; Inject 0.08 mL (8 Units total) under the skin Three (3) times a day before meals.  -     Discontinue: insulin detemir U-100 (LEVEMIR) 100 unit/mL (3 mL) injection pen; Inject 0.5 mL (50 Units total) under the skin daily.  -     insulin ASPART (NOVOLOG) 100 unit/mL injection; Inject 0.1 mL (10 Units total) under the skin Three (3) times a day before meals.  -     insulin detemir U-100 (LEVEMIR) 100 unit/mL (3 mL) injection pen; Inject 0.52 mL (52 Units total) under the skin daily.    Diabetic polyneuropathy associated with type 2 diabetes mellitus (CMS-HCC)    He has sensation in all areas of feet except for the pads of bilat feet and toes - Which is decreased but not completely diminished   ??  Pt reports decreased sensation in bilat feet and neuropathy ??  He is taking Gabapentin 800mg  TID  Will continue with this dosing     Hyperlipidemia associated with type 2 diabetes mellitus (CMS-HCC)    Pt is currently on atorvastatin 40mg  daily   Will continue with this regimen     Primary hypertension    Pt BP today is 128/82 which is at goal   He is currently on Lisinopril 2.5mg    Will continue with this dosing     Encounter for subsequent annual wellness visit (AWV) in Medicare patient         Return in about 3 months (around 03/02/2021) for Recheck DM and HTN .    Subjective:     HPI: Cory Bentley is a 60 y.o. male here for   Chief Complaint   Patient presents with   ??? Follow-up     DM    ??? Subsequent AWV   :  Cory Bentley was seen today for follow-up and subsequent awv.    Diagnoses and all orders for this visit:    Type 2 diabetes mellitus with other skin complication, with long-term current use of insulin (CMS-HCC)  Pt A1c today is 7.7 - This is up from 7.4 in May 2022  On review of diet he has increased steak intake, eat mac n' cheese freq, uses tortilla in a lot of meals, and snacks on chips and cheetos  Discussed the importance of diet with lean meats, low carb, low sugar, and healthy portion sizes  He verbalizes that he understands but does not seem vested in POC   He brings with him his home Libre readings and the average is 145 over the past several days   He is currently on Novolog 8u with meals - Will increase to 10u with meals  He is also on Levemir 50u nightly - Will increase to 52u nightly   He reports that he is now taking his Jardiance 20mg  daily (which he was not doing at last visit)  Discussed goal for blood glucose is 80-110  Pt albumin/creatinie ration was elevated at 492.2 at last OV and he was referred to nephrology   He had a visit 10/25/2020 and has a follow up 01/03/2021 (he did not remember this appt until reminded today)    Diabetic polyneuropathy associated with type 2 diabetes mellitus (CMS-HCC)  He has sensation in all areas of feet except for the pads of bilat feet and toes - Which is decreased but not completely diminished ??  Pt reports decreased sensation in bilat feet and neuropathy ??  He is taking Gabapentin 800mg  TID  Will continue with this dosing     Hyperlipidemia associated with type 2 diabetes mellitus (CMS-HCC)  Pt is currently on atorvastatin 40mg  daily   Will continue with this regimen     Primary hypertension  Pt BP today is 128/82 which is at goal   He is currently on Lisinopril 2.5mg    Will continue with this dosing         ROS:   Review of Systems   Constitutional: Negative for activity change, appetite change, fatigue and unexpected weight change.   Respiratory: Negative for chest tightness and shortness of breath.    Cardiovascular: Negative for chest pain, palpitations and leg swelling.   Gastrointestinal: Negative for abdominal pain, blood in stool, constipation, diarrhea, nausea and vomiting.   Endocrine: Negative for polydipsia, polyphagia and polyuria.   Musculoskeletal: Positive for gait problem.   Neurological: Negative for dizziness, light-headedness and headaches.        Diabetic neuropathy of hands and feet   Psychiatric/Behavioral: Positive for decreased concentration. Negative for sleep disturbance. The patient is nervous/anxious.         Review of systems negative unless otherwise noted as per HPI    Objective:     Visit Vitals  BP 128/82   Pulse 103   Temp 36.6 ??C (97.8 ??F) (Temporal)   Ht 172.7 cm (5' 8)   Wt 92.1 kg (203 lb)   SpO2 98%   BMI 30.87 kg/m??          11/30/20 1051   PainSc: 0-No pain        Physical Exam  Constitutional:       General: He is not in acute distress.     Appearance: Normal appearance. He is well-developed and well-groomed. He is not ill-appearing.   HENT:  Head: Normocephalic and atraumatic.      Mouth/Throat:      Lips: Pink.      Mouth: Mucous membranes are moist.   Eyes:      Conjunctiva/sclera: Conjunctivae normal.   Cardiovascular:      Rate and Rhythm: Normal rate and regular rhythm.      Heart sounds: Normal heart sounds, S1 normal and S2 normal.   Pulmonary:      Effort: Pulmonary effort is normal.      Breath sounds: Normal breath sounds and air entry.   Abdominal:      General: Bowel sounds are normal.      Tenderness: There is no abdominal tenderness.   Skin:     General: Skin is warm and dry.   Neurological:      Mental Status: He is alert and oriented to person, place, and time.      Comments: Diabetic neuropathy of bilat hands and feet   Psychiatric:         Attention and Perception: Attention and perception normal.         Mood and Affect: Mood and affect normal.         Speech: Speech normal.         Behavior: Behavior normal. Behavior is cooperative.         Thought Content: Thought content normal.          [facility]:     Medication adherence and barriers to the treatment plan have been addressed. Opportunities to optimize healthy behaviors have been discussed. Patient / caregiver voiced understanding.

## 2020-12-03 NOTE — Unmapped (Signed)
We discussed A1c while pt was in office

## 2020-12-05 ENCOUNTER — Ambulatory Visit: Admit: 2020-12-05 | Discharge: 2020-12-06 | Payer: MEDICARE

## 2020-12-05 DIAGNOSIS — L409 Psoriasis, unspecified: Principal | ICD-10-CM

## 2020-12-05 DIAGNOSIS — Z79899 Other long term (current) drug therapy: Principal | ICD-10-CM

## 2020-12-05 NOTE — Unmapped (Addendum)
- Continue betamethasone, augmented, (DIPROLENE) 0.05 % gel; Apply topically Two (2) times a day.  - Continue ustekinumab (STELARA) 45 mg/0.5 mL Syrg syringe; Inject 45 mg subcutaneous every 12 weeks    ustekinumab  Pronunciation:  YOO sti KIN ue mab  Brand:  Stelara, Stelara PFS  What is the most important information I should know about ustekinumab?  Serious infections may occur during treatment with ustekinumab. Call your doctor right away if you have signs of infection such as: fever, chills, muscle pain, shortness of breath, weight loss, diarrhea or stomach pain, burning when you urinate, feeling very tired, skin warmth or redness, painful skin sores, or coughing up blood.  Ustekinumab may also cause a rare but serious condition affecting the brain. Tell your doctor right away if you have a headache, confusion, vision problems, or a seizure.  What is ustekinumab?  Ustekinumab is used to treat plaque psoriasis in adults and children who are at least 61 years old. Ustekinumab is also used to treat psoriatic arthritis in adults, and is sometimes given with another medicine called methotrexate.  Ustekinumab is also used in adults to treat moderately to severely active Crohn's disease or ulcerative colitis.  Ustekinumab may also be used for purposes not listed in this medication guide.  What should I discuss with my health care provider before using ustekinumab?  You should not use ustekinumab if you are allergic to it.  Tell your doctor if you have ever had tuberculosis, if anyone in your household has tuberculosis, or if you have recently traveled to an area where tuberculosis is common.  Tell your doctor if you have ever had:  signs of infection (fever, chills, cough, muscle aches, painful skin sores, diarrhea, pain when you urinate, feeling very tired);  chronic infections;  new or changing skin lesions;  a latex allergy;  phototherapy (light therapy);  allergy shots; or  if you recently received or are scheduled to receive any vaccine.  Some people using ustekinumab have developed skin cancer (non-melanoma). However, these people may have had a higher risk of skin cancer. Talk to your doctor about this risk and what skin symptoms to watch for. You may need to have regular skin exams.  Tell your doctor if you are pregnant or breastfeeding.  If you are pregnant, your name may be listed on a pregnancy registry to track the effects of ustekinumab on the baby.  How should I use ustekinumab?  Before you start treatment with ustekinumab, your doctor may perform tests to make sure you do not have tuberculosis or other infections.  Follow all directions on your prescription label and read all medication guides or instruction sheets. Use the medicine exactly as directed. You must remain under the care of a doctor while you are receiving ustekinumab.  Ustekinumab is injected under the skin, or as an infusion into a vein, usually once every 12 weeks. Your first and second dose may be only 4 weeks apart.  A healthcare provider will give your first dose and may teach you how to properly use the medication by yourself. Read and carefully follow any Instructions for Use provided with your medicine. Do not use ustekinumab if you don't understand all instructions for proper use. Ask your doctor or pharmacist if you have questions.  Prepare your injection only when you are ready to give it. Do not use if the medicine looks cloudy, has changed colors, or has particles in it. Call your pharmacist for new medicine.  Your care provider  will show you the best places on your body to inject ustekinumab. Use a different place each time you give an injection. Do not inject into the same place two times in a row. Avoid injecting the medicine into skin that is red, bruised, swollen, or tender.  Ustekinumab doses are based on weight. Your dose needs may change if you gain or lose weight.  Call your doctor if you have any signs of tuberculosis: fever, cough, night sweats, loss of appetite, weight loss, and feeling constantly tired.  You will need frequent medical tests.  Each prefilled syringe or single-use vial (bottle) is for one use only. Throw it away after one use, even if there is still medicine left inside.  Store in the refrigerator. Protect from light. Do not freeze and not shake the medicine. Store ustekinumab vials in an upright position.  Use a needle and syringe only once and then place them in a puncture-proof sharps container. Follow state or local laws about how to dispose of this container. Keep it out of the reach of children and pets.  What happens if I miss a dose?  Call your doctor for instructions if you miss a dose.  What happens if I overdose?  Seek emergency medical attention or call the Poison Help line at [phone].  What should I avoid while using ustekinumab?  Avoid being near people who are sick or have infections. Tell your doctor at once if you develop signs of infection.  Do not receive a live vaccine while using ustekinumab, and avoid coming into contact with anyone who has recently received a live vaccine. There is a [place] that the virus could be passed on to you. Live vaccines include measles, mumps, rubella (MMR), rotavirus, typhoid, yellow fever, varicella (chickenpox), zoster (shingles), and nasal flu (influenza) vaccine.  BCG vaccine should not be given for at least 1 year after you receive your last dose of ustekinumab.   Non-live vaccines (including flu shots) may not work as well during your treatment, and may not fully protect you from disease. Make sure you are current on all vaccines before you begin treatment with ustekinumab.  What are the possible side effects of ustekinumab?  Get emergency medical help if you have signs of an allergic reaction:  hives; chest pain, difficult breathing; feeling light-headed; swelling of your face, lips, tongue, or throat.  Serious infections may occur during treatment with ustekinumab. Call your doctor right away if you have signs of infection such as: fever, chills, muscle pain, shortness of breath, weight loss, diarrhea or stomach pain, burning when you urinate, feeling very tired, skin warmth or redness, painful skin sores, or coughing up blood.  Also call your doctor at once if you have:  a mole that has changed in size or color;  swelling, pain, warmth, or redness anywhere on your body;  stomach pain that is sudden and severe or comes on slowly, changes in bowel habits (diarrhea or constipation);  new or worsening cough, sudden chest pain, feeling short of breath;  pain or burning when you urinate; or  severe headache, confusion, change in mental status, vision problems, and/or seizure (convulsions).  Common side effects may include:  fever, flu-like symptoms;  itching;  stomach pain, nausea, vomiting, diarrhea;  vaginal itching or discharge;  pain or burning when you urinate;  cough with mucus, shortness of breath, chest discomfort;  headache, tiredness; or  redness where ustekinumab was injected.  This is not a complete list of side effects and  others may occur. Call your doctor for medical advice about side effects. You may report side effects to FDA at 1-800-FDA-1088.  What other drugs will affect ustekinumab?  Other drugs may affect ustekinumab, including prescription and over-the-counter medicines, vitamins, and herbal products. Tell your doctor about all your current medicines and any medicine you start or stop using.  Where can I get more information?  Your doctor or pharmacist can provide more information about ustekinumab.  Remember, keep this and all other medicines out of the reach of children, never share your medicines with others, and use this medication only for the indication prescribed.   Every effort has been made to ensure that the information provided by [employer] ('[employer]') is accurate, up-to-date, and complete, but no guarantee is made to that effect. Drug information contained herein may be time sensitive. [employer] information has been compiled for use by healthcare practitioners and consumers in the [place] and therefore [employer] does not warrant that uses outside of the [place] are appropriate, unless specifically indicated otherwise. [employer]'s drug information does not endorse drugs, diagnose patients or recommend therapy. [employer]'s drug information is an Investment banker, corporate to assist licensed healthcare practitioners in caring for their patients and/or to serve consumers viewing this service as a supplement to, and not a substitute for, the expertise, skill, knowledge and judgment of healthcare practitioners. The absence of a warning for a given drug or drug combination in no way should be construed to indicate that the drug or drug combination is safe, effective or appropriate for any given patient. [employer] does not assume any responsibility for any aspect of healthcare administered with the aid of information [employer] provides. The information contained herein is not intended to cover all possible uses, directions, precautions, warnings, drug interactions, allergic reactions, or adverse effects. If you have questions about the drugs you are taking, check with your doctor, nurse or pharmacist.  Copyright [phone] [employer] Version: 11.01. Revision date: 03/16/2019.  Care instructions adapted under license by [facility]. If you have questions about a medical condition or this instruction, always ask your healthcare professional. [employer] disclaims any warranty or liability for your use of this information.

## 2020-12-05 NOTE — Unmapped (Signed)
Dermatology Note     Assessment and Plan:      Plaque psoriasis, significantly improved on 10 weeks of Stelara, BSA ~5%  - Educated, discussed chronic nature and waxing/waning  - Handout given  - Discussed different treatments including topical corticosteroids vs nbUVB vs systemic therapy (CsA, MTX, etanercept, adalimumab, ustekinumab, ixekizumab, apremilast)  - Extensive discussion regarding immunosuppressive effects of biologics and increased risk of infection, which raises concern given his hx of poorly controlled DMII. However, pt notes his BGL was poorly controlled when he was using cocaine. He notes he has been abstinent from illegal substances for 1 year.   - Per chart review, previously documented soft tissue infections related to diabetes and hx of non-compliance  - HbA1c 08/2020 7.4%  - Continue betamethasone, augmented, (DIPROLENE) 0.05 % gel; Apply topically Two (2) times a day.  - would avoid methotrexate and cyclosporine given poorly controlled diabetes   - given overall concern for risk of infection (hx of poorly controlled DMII and diabetes-related infections ex: cellulitis and osteomyelitis), we aim to minimize infection risk when selecting a biologic. We were very frank in talking about infection risk, which is generally small but real, and he understands this and wishes to proceed based on a balanced judgement of risks and benefits.   - Continue ustekinumab (STELARA) 45 mg/0.5 mL Syrg syringe; Inject 45 mg subcutaneous every 12 weeks  ??  High risk medication use  Encounter for screening for other viral diseases   - CBC w diff and CMP 08/29/20 reviewed--noted elevated fasting glucose   - Quantiferon TB Gold Plus negative 09/26/20  - Hepatitis B and Hepatitis C labs nonreactive 09/26/20    The patient was advised to call for an appointment should any new, changing, or symptomatic lesions develop.     RTC: Return in about 6 months (around 06/07/2021) for follow up of psoriasis. or sooner as needed _________________________________________________________________      Chief Complaint     Chief Complaint   Patient presents with   ??? Psoriasis     Taking shots has worked well  Stelara no other areas of concern       HPI     Cory Bentley is a 60 y.o. male who presents as a returning patient (last seen by Dr. Herbie Drape and Dr. Linton Rump on 09/26/2020) to [facility] for follow up of psoriasis. At last visit, patient was to start diprolene 0.05% gel BID and start stelara 45 mg weeks 0 and 4 and every 12 weeks thereafter for psoriasis.    Today, patient reports improvement of psoriasis with Stelara. He has done two treatments with Stelara since last visit with toleration.     The patient denies any other new or changing lesions or areas of concern.     Pertinent Past Medical History     No history of skin cancer   Type 2 diabetes with long-term current use of insulin  Diabetic polyneuropathy  Gastroesophageal reflux disease  Primary hypertension  Cocaine abuse in remission  Marijuana abuse    Allergies:  NKDA    Family History:   Negative for melanoma   Daughter with psoriasis    Social History:  Lives in [place]    Past Medical History, Family History, Social History, Medication List, Allergies, and Problem List were reviewed in the rooming section of Epic.     ROS: Other than symptoms mentioned in the HPI, no fevers, chills, or other skin complaints    Physical Examination  GENERAL: Well-appearing male in no acute distress, resting comfortably.  NEURO: Alert and oriented, answers questions appropriately  PSYCH: Normal mood and affect  SKIN: Examination of the chest, abdomen, back, bilateral upper extremities and bilateral lower extremities was performed  - Scattered thin psoriatic plaques but mostly background hypopigmented at areas of prior psoriasis    All areas not commented on are within normal limits or unremarkable     Scribe's Attestation: Abbie Sons, MD obtained and performed the history, physical exam and medical decision making elements that were entered into the chart.  Signed by Kelle Darting, Scribe, on December 05, 2020 9:43 AM.    December 05, 2020 10:39 AM. Documentation assistance provided by the Scribe. I was present during the time the encounter was recorded. The information recorded by the Scribe was done at my direction and has been reviewed and validated by me.      (Approved Template 12/27/2019)

## 2020-12-06 NOTE — Unmapped (Signed)
[employer] states  Novolog 100 unit/ml vial is the formulary preference

## 2021-01-10 NOTE — Unmapped (Signed)
[facility] Refill Coordination Note    Specialty Medication(s) to be Shipped:   Inflammatory Disorders: Stelara    Other medication(s) to be shipped: No additional medications requested for fill at this time     Cory Bentley, DOB: Apr 13, 1961  Phone: [phone] (home) [phone] (work)      All above HIPAA information was verified with patient.     Was a Nurse, learning disability used for this call? No    Completed refill call assessment today to schedule patient's medication shipment from the [facility] [phone]).  All relevant notes have been reviewed.     Specialty medication(s) and dose(s) confirmed: Regimen is correct and unchanged.   Changes to medications: Cory Bentley reports no changes at this time.  Changes to insurance: No  New side effects reported not previously addressed with a pharmacist or physician: None reported  Questions for the pharmacist: No    Confirmed patient received a Conservation officer, historic buildings and a Surveyor, mining with first shipment. The patient will receive a drug information handout for each medication shipped and additional FDA Medication Guides as required.       DISEASE/MEDICATION-SPECIFIC INFORMATION        For patients on injectable medications: Patient currently has 0 doses left.  Next injection is scheduled for 01/24/2021.    SPECIALTY MEDICATION ADHERENCE     Medication Adherence    Patient reported X missed doses in the last month: 0  Specialty Medication: STELARA 45 mg/0.5 mL  Patient is on additional specialty medications: No  Any gaps in refill history greater than 2 weeks in the last 3 months: no  Demonstrates understanding of importance of adherence: yes  Informant: patient  Reliability of informant: reliable  Confirmed plan for next specialty medication refill: delivery by pharmacy  Refills needed for supportive medications: not needed              Were doses missed due to medication being on hold? No    Stelara 45/0.5 mg/ml: 0 days of medicine on hand       REFERRAL TO PHARMACIST     Referral to the pharmacist: Not needed      [facility]     Shipping address confirmed in Epic.     Delivery Scheduled: Yes, Expected medication delivery date: 01/17/2021.     Medication will be delivered via Same Day Courier to the prescription address in Epic WAM.    Cory Bentley Cory Bentley   [facility] Specialty Technician

## 2021-01-17 MED ORDER — ALCOHOL SWABS
Freq: Four times a day (QID) | TOPICAL | 1 refills | 0 days | Status: CP
Start: 2021-01-17 — End: ?

## 2021-01-17 MED ORDER — INSULIN SYRINGE U-100 WITH NEEDLE 0.5 ML 29 GAUGE X 1/2" (12 MM)
Freq: Four times a day (QID) | INTRAMUSCULAR | 2 refills | 0 days | Status: CP
Start: 2021-01-17 — End: 2021-02-16

## 2021-01-17 MED FILL — STELARA 45 MG/0.5 ML SUBCUTANEOUS SYRINGE: 84 days supply | Qty: 0.5 | Fill #0

## 2021-01-17 NOTE — Unmapped (Signed)
Patient is requesting the following refill  Requested Prescriptions     Pending Prescriptions Disp Refills   ??? alcohol swabs (ALCOHOL PADS) PadM  0     Sig: Apply topically.   ??? insulin syringe-needle U-100 0.5 mL 29 gauge x 1/2 (12 mm) Syrg  0       Recent Visits  Date Type Provider Dept   11/30/20 Office Visit Amie Portland, FNP [facility]   09/28/20 Office Visit Amie Portland, FNP [facility]   08/29/20 Office Visit Amie Portland, FNP [facility]   Showing recent visits within past 365 days with a meds authorizing provider and meeting all other requirements  Future Appointments  Date Type Provider Dept   03/02/21 Appointment Amie Portland, FNP [facility]   Showing future appointments within next 365 days with a meds authorizing provider and meeting all other requirements

## 2021-01-18 NOTE — Unmapped (Addendum)
Pt is out of needles.    Answer Assessment - Initial Assessment Questions  1. NAME of MEDICATION: What medicine are you calling about?      Pen needle Rx for insulin pens  2. QUESTION: What is your question? (e.g., medication refill, side effect)      Needs Rx for pen needles for insulin pens. Rx for insulin syringe and needles sent instead of Rx for pen needles  3. PRESCRIBING HCP: Who prescribed it? Reason: if prescribed by specialist, call should be referred to that group.      PCP  4. SYMPTOMS: Do you have any symptoms?      No  5. SEVERITY: If symptoms are present, ask Are they mild, moderate or severe?      NA  6. PREGNANCY:  Is there any Chestang that you are pregnant? When was your last menstrual period?      NA    Protocols used: MEDICATION QUESTION CALL-A-AH

## 2021-01-18 NOTE — Unmapped (Signed)
Copied from CRM [id number]. Topic: HL - Nurse Triage - [facility] Contract Page  >> Jan 17, 2021  7:10 PM Otila Back, RN wrote:  Please use the following dot phrase: .[email or web address]    Dr Vinson Moselle updated on status, verbal order received; pharmacist Mohammed given verbal order; patient notified, verbalized understanding.

## 2021-01-18 NOTE — Unmapped (Addendum)
Regarding: needing rx for insulin needles, hasnt had insulin all day  ----- Message from Jomarie Longs, LRT/CTRS  sent at 01/17/2021  5:57 PM EDT -----  If you had not called [facility] Connect, what do you think you would have done?   Go to Emergency Dept    S: Pt calling about needing a Rx for pen needles for his insulin pens  B: Uses insulin pens and needs Rx for pen needles. Wrong Rx (insulin syringe with needles) sent.  A: No symptoms  R: Page on call provider for Rx for boxes of pen needles.      [id number] - [place], [place] - [address] AT [facility] [address] & [place]: [phone]  F: [phone]    Address    [address] [place] [address]   Store number: [phone]   Near the intersection of: [address]        Closes at 10 pm

## 2021-01-22 DIAGNOSIS — I10 Essential (primary) hypertension: Principal | ICD-10-CM

## 2021-01-23 ENCOUNTER — Ambulatory Visit: Admit: 2021-01-23 | Discharge: 2021-01-24 | Payer: MEDICARE

## 2021-01-23 DIAGNOSIS — I10 Essential (primary) hypertension: Principal | ICD-10-CM

## 2021-01-23 LAB — RENAL FUNCTION PANEL
ALBUMIN: 3.7 g/dL (ref 3.4–5.0)
ANION GAP: 10 mmol/L (ref 5–14)
BLOOD UREA NITROGEN: 16 mg/dL (ref 9–23)
BUN / CREAT RATIO: 19
CALCIUM: 9.3 mg/dL (ref 8.7–10.4)
CHLORIDE: 101 mmol/L (ref 98–107)
CO2: 27 mmol/L (ref 20.0–31.0)
CREATININE: 0.86 mg/dL
EGFR CKD-EPI (2021) MALE: >90 mL/min/{1.73_m2} (ref >=60)
GLUCOSE RANDOM: 92 mg/dL (ref 70–179)
PHOSPHORUS: 3.1 mg/dL (ref 2.4–5.1)
POTASSIUM: 4.2 mmol/L (ref 3.4–4.8)
SODIUM: 138 mmol/L (ref 135–145)

## 2021-01-24 ENCOUNTER — Ambulatory Visit: Admit: 2021-01-24 | Discharge: 2021-01-25 | Payer: MEDICARE

## 2021-01-24 DIAGNOSIS — R809 Proteinuria, unspecified: Principal | ICD-10-CM

## 2021-01-24 DIAGNOSIS — Z794 Long term (current) use of insulin: Principal | ICD-10-CM

## 2021-01-24 DIAGNOSIS — E11628 Type 2 diabetes mellitus with other skin complications: Principal | ICD-10-CM

## 2021-01-24 LAB — PROTEIN / CREATININE RATIO, URINE
CREATININE, URINE: 132.4 mg/dL
PROTEIN URINE: 124.8 mg/dL
PROTEIN/CREAT RATIO, URINE: 0.943

## 2021-01-24 NOTE — Unmapped (Signed)
PCP:  Herschell Dimes, FNP    01/24/2021  [id number]  [facility] [place]  [address]  Felipa Bentley  [place] [address]  Dept: [phone]  Loc: [phone]    Chief Complaint: Evaluation of proteinuria    HPI:  [name] is a 60 y.o.  male who presents for evaluation of proteinuria. 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 His diabetes was poorly controlled for many years, with a documented a1c on his chart of 14%. He notes that this was fairly typical for him during that time period and many times he would max out his glucometer. His glucose is much better controlled now, but there is still room for improvement. His PCP is encouraging him to see endocrinology.     FH is positive for a cousin who required kidney transplant x2. The cause of renal disease is unknown. FH is also positive for DM2.     ROS:   CONSTITUTIONAL: denies fevers or chills, denies unintentional weight loss   CARDIOVASCULAR: denies chest pain, denies dyspnea on exertion, denies leg edema  GASTROINTESTINAL: denies nausea, denies vomiting, denies anorexia  GENITOURINARY: denies dysuria, denies hematuria, denies decreased urinary stream  All 10 systems reviewed and are negative except as listed above.    PAST MEDICAL HISTORY:  Past Medical History:   Diagnosis Date   ??? Diabetes mellitus (CMS-HCC)    ??? Hypertension    ??? Psoriasis    ??? Substance abuse (CMS-HCC)        ALLERGIES  Patient has no known allergies.    FAMILY HISTORY  Family History   Problem Relation Age of Onset   ??? Diabetes Mother    ??? Diabetes type II Mother    ??? Ovarian cancer Mother    ??? Diabetes Father    ??? Diabetes type II Father    ??? Diabetes type II Sister    ??? Diabetes type II Brother    ??? Diabetes type II Maternal Grandmother    ??? Diabetes type II Maternal Grandfather ??? Diabetes type II Paternal Grandmother    ??? Diabetes type II Paternal Grandfather    ??? Melanoma Neg Hx    ??? Basal cell carcinoma Neg Hx    ??? Squamous cell carcinoma Neg Hx        SOCIAL HISTORY   reports that he has never smoked. He has never used smokeless tobacco. He reports current alcohol use. He reports current drug use. Frequency: 7.00 times per week. Drug: Marijuana.    MEDICATIONS:  Current Outpatient Medications   Medication Sig Dispense Refill   ??? alcohol swabs (ALCOHOL PADS) PadM Apply 1 swab topically four (4) times a day. 400 each 1   ??? atorvastatin (LIPITOR) 40 MG tablet Take 1 tablet (40 mg total) by mouth in the morning. 90 tablet 1   ??? BD ULTRA-FINE MINI PEN NEEDLE 31 gauge x 3/16 (5 mm) Ndle INJECT 1 SYRINGE AS DIRECTED FOUR TIMES DAILY     ??? betamethasone, augmented, (DIPROLENE) 0.05 % gel      ??? blood sugar diagnostic Strp Accu Check Aviva Strip, use 4 times per day for monitoring insulin dependant uncontrolled diabetes     ??? blood-glucose meter kit Use as instructed 1 each 0   ??? empagliflozin (JARDIANCE) 10 mg tablet Take  2 tablets (20 mg total) by mouth daily. 180 tablet 1   ??? empty container Misc Use as directed to dispose of Stelara syringes. 1 each 2   ??? flash glucose sensor (FREESTYLE LIBRE 14 DAY SENSOR) kit Apply new sensor every 14 days and use as directed to monitor and check blood sugars 2 each 12   ??? gabapentin (NEURONTIN) 800 MG tablet Take 1 tablet (800 mg total) by mouth Three (3) times a day. 270 tablet 1   ??? insulin ASPART (NOVOLOG) 100 unit/mL injection Inject 0.1 mL (10 Units total) under the skin Three (3) times a day before meals. 10 mL 1   ??? insulin detemir U-100 (LEVEMIR) 100 unit/mL (3 mL) injection pen Inject 0.52 mL (52 Units total) under the skin daily. 15 mL 1   ??? insulin syringe-needle U-100 0.5 mL 29 gauge x 1/2 (12 mm) Syrg Inject 1 Syringe as directed four (4) times a day. 200 each 2   ??? lisinopriL (PRINIVIL,ZESTRIL) 2.5 MG tablet Take 1 tablet (2.5 mg total) by mouth in the morning. 90 tablet 1   ??? triamcinolone (KENALOG) 0.1 % cream Apply topically Two (2) times a day. 80 g 0   ??? ustekinumab (STELARA) 45 mg/0.5 mL Syrg syringe Inject the contents of 1 syringe (45 mg) under the skin initially and THEN inject 1 syringe (45 mg) again 4 weeks later as loading doses. 1 mL 0     No current facility-administered medications for this visit.       PHYSICAL EXAM:  Wt Readings from Last 3 Encounters:   01/24/21 92.5 kg (204 lb)   11/30/20 92.1 kg (203 lb)   10/25/20 92.3 kg (203 lb 8 oz)     Temp Readings from Last 3 Encounters:   01/24/21 36.3 ??C (97.3 ??F) (Temporal)   11/30/20 36.6 ??C (97.8 ??F) (Temporal)   10/25/20 36.3 ??C (97.3 ??F) (Temporal)     BP Readings from Last 5 Encounters:   01/24/21 180/100   11/30/20 128/82   10/25/20 132/73   09/28/20 134/66   08/29/20 102/60     Pulse Readings from Last 3 Encounters:   01/24/21 79   11/30/20 103   10/25/20 71       CONSTITUTIONAL: Alert,well appearing, no distress. Ambulates with a cane  HEENT: Moist mucous membranes, oropharynx clear without erythema or exudate  EYES: Extra ocular movements intact. Pupils reactive, sclerae anicteric.  NECK: Supple, no lymphadenopathy  CARDIOVASCULAR: Regular, normal S1/S2 heart sounds, no murmurs, no rubs.   PULM: Clear to auscultation bilaterally  GASTROINTESTINAL: Soft, active bowel sounds, nontender  EXTREMITIES: No lower extremity edema bilaterally.   SKIN: No rashes or lesions  NEUROLOGIC: No focal motor or sensory deficits    MEDICAL DECISION MAKING  Urinalysis performed in office revealed specific gravity 1.015 with pH of 6 and was negative for leukocyte esterase, nitrite, urobilinogen, protein, blood, ketone, bilirubin or glucose.     Creatinine   Date Value Ref Range Status   01/23/2021 0.86 0.60 - 1.10 mg/dL Final   16/01/9603 5.40 0.60 - 1.10 mg/dL Final   98/02/9146 1.0 0.5 - 1.4 mg/dL Final   82/95/6213 0.9 0.5 - 1.4 mg/dL Final   08/65/7846 0.8 0.5 - 1.4 mg/dL Final   96/29/5284 1.1 0.5 - 1.4 mg/dL Final   13/24/4010 1.0 0.5 - 1.4 mg/dL Final   27/25/3664 1.2 0.5 - 1.4 mg/dL Final   40/34/7425 1.1 0.5 - 1.4 mg/dL Final   95/63/8756 1.0 0.5 - 1.4  mg/dL Final   16/01/9603 0.8 0.5 - 1.4 mg/dL Final   54/12/8117 0.9 0.5 - 1.4 mg/dL Final        Lab Results   Component Value Date    NA 138 01/23/2021    K 4.2 01/23/2021    CL 101 01/23/2021    CO2 27.0 01/23/2021    BUN 16 01/23/2021    CREATININE 0.86 01/23/2021    GLU 92 01/23/2021    CALCIUM 9.3 01/23/2021    ALBUMIN 3.7 01/23/2021    PHOS 3.1 01/23/2021       Lab Results   Component Value Date    CALCIUM 9.3 01/23/2021       Lab Results   Component Value Date    BILITOT 0.5 08/29/2020    BILIDIR 0.40 06/01/2018    PROT 7.0 08/29/2020    ALBUMIN 3.7 01/23/2021    ALT 23 08/29/2020    AST 26 08/29/2020    ALKPHOS 81 08/29/2020    GGT 1,007 (H) 05/28/2018     Lab Results   Component Value Date    INR 1.0 05/01/2019    APTT 24.2 (L) 05/01/2019       HGB   Date Value Ref Range Status   08/29/2020 14.4 12.9 - 16.5 g/dL Final   14/78/2956 21.3 (L) 14.0 - 18.0 g/dL Final   08/65/7846 96.2 (L) 14.0 - 18.0 g/dL Final   95/28/4132 44.0 (L) 14.0 - 18.0 g/dL Final   02/09/2535 64.4 (L) 14.0 - 18.0 g/dL Final   03/47/4259 56.3 (L) 14.0 - 18.0 g/dL Final   87/56/4332 95.1 (L) 14.0 - 18.0 g/dL Final   88/41/6606 30.1 (L) 14.0 - 18.0 g/dL Final   60/01/9322 55.7 (L) 14.0 - 18.0 g/dL Final   32/20/2542 70.6 (L) 14.0 - 18.0 g/dL Final   23/76/2831 51.7 (L) 14.0 - 18.0 g/dL Final        Lab Results   Component Value Date    WBC 9.0 08/29/2020    HGB 14.4 08/29/2020    HCT 44.1 08/29/2020    PLT 162 08/29/2020     Lab Results   Component Value Date    IRON 90 01/27/2020    TIBC 319 01/27/2020    FERRITIN 47.4 01/27/2020       Lab Results   Component Value Date    A1C 7.7 (A) 11/30/2020    A1C 7.4 (A) 08/29/2020    A1C 9.6 (A) 01/27/2020       Lab Results   Component Value Date    Color, UA Yellow 03/30/2019    Glucose, UA Trace (A) 03/30/2019    Ketones, UA Negative 03/30/2019    Blood, UA Trace (A) 03/30/2019    Nitrite, UA Negative 03/30/2019   ]    Lab Results   Component Value Date    CREATUR 90.0 09/28/2020     No results found for: PCRATIOUR    Lab Results   Component Value Date    CHOL 141 08/29/2020    A1C 7.7 (A) 11/30/2020    ANA Negative 05/27/2018    HEPAIGG Nonreactive 04/22/2018    HEPBCAB Nonreactive 09/26/2020    HEPCAB Nonreactive 09/26/2020       PVL ABI  Narrative: This is a summary report. The complete report is available in the patient's medical record. If you cannot access the medical record, please contact the sending organization for a detailed fax or copy.    INTERPRETATION:  Pulse volume recording with  Doppler pressures reveals a pressure of 225 mmHg at the right ankle PT for an index of 1.17 and 191 mmHg at the right ankle DP for an index of .99.  Pulse volume recording with Doppler pressures reveals a pressure of 230 mmHg at the left ankle PT for an index of 1.20 and 222 mmHg at the left ankle DP for an index of 1.16.    High brachial pressures noted of 181 mmHg on the right and 192 mmHg on the left.  Pressures repeated for accuracy. Patient states he drank 2 cups of coffee before test.    Examination of pressures reveals no significant fall-off of mmHg between the high thigh and ankle on the right side.  On the left side, there is no significant fall-off of mmHg between high thigh and ankle.    Examination revealed triphasic waveforms at the right upper thigh to ankle.  On the left side, triphasic waveforms noted from thigh to ankle.    Digital waveforms were triphasic and digital pressures were abnormal on the right.  Digital waveforms were triphasic and digital pressures were normal on the left.    Toenails appear normal in thickness and color.    No treadmill ordered.    No previous study to compare to.    FINAL   Impression:     Normal resting study.  Occlusive arterial disease does not appear to be present at rest.  Treadmill testing is recommended if claudication is suspected.       ASSESSMENT/PLAN:    Mr.Adley Huebert is a 60 y.o. patient with a past medical history significant for DM2 w/ DR who is being seen for evaluation of proteinuria.     1. Chronic Kidney Disease Stage 1 - the patient has preserved renal function however he is developing proteinuria which is an indicator of the onset of renal disease. At this time, I urged him to maintain stringent glucose control. He is on Jardiance and lisinopril. If possible, we can try to increase the dose of lisinopril. Continue to check BMP and Urine pro/cr ratio before each visit.     2. HTN - controlled     3. 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 Ideally, I would like him to be <7%. Defer to PCP for management. I did tell him I agree with the recommendation that he see an endocrinologist.     Mr.Shashwat Schlicker will follow up in  3 month(s) with the following labs within 1 weeks of their next visit: RFP, Urine pro/creat ratio      Disclaimer: Much of the narrative of this dictation was acquired using speech recognition software. It is possible that some dictated speech was not transcribed accurately by this system nor detected in the proofing. Such errors are at times unavoidable.

## 2021-03-02 ENCOUNTER — Ambulatory Visit: Admit: 2021-03-02 | Discharge: 2021-03-03 | Payer: MEDICARE

## 2021-03-02 DIAGNOSIS — E1169 Type 2 diabetes mellitus with other specified complication: Principal | ICD-10-CM

## 2021-03-02 DIAGNOSIS — N529 Male erectile dysfunction, unspecified: Principal | ICD-10-CM

## 2021-03-02 DIAGNOSIS — I10 Essential (primary) hypertension: Principal | ICD-10-CM

## 2021-03-02 DIAGNOSIS — E1142 Type 2 diabetes mellitus with diabetic polyneuropathy: Principal | ICD-10-CM

## 2021-03-02 DIAGNOSIS — E785 Hyperlipidemia, unspecified: Principal | ICD-10-CM

## 2021-03-02 DIAGNOSIS — Z794 Long term (current) use of insulin: Principal | ICD-10-CM

## 2021-03-02 DIAGNOSIS — E11628 Type 2 diabetes mellitus with other skin complications: Principal | ICD-10-CM

## 2021-03-02 MED ORDER — EMPAGLIFLOZIN 10 MG TABLET
ORAL_TABLET | Freq: Every day | ORAL | 1 refills | 90 days | Status: CP
Start: 2021-03-02 — End: ?

## 2021-03-02 MED ORDER — ATORVASTATIN 40 MG TABLET
ORAL_TABLET | Freq: Every day | ORAL | 1 refills | 90 days | Status: CP
Start: 2021-03-02 — End: ?

## 2021-03-02 MED ORDER — DIAPER,BRIEF,ADULT,DISPOSABLE
Freq: Two times a day (BID) | 2 refills | 0 days | Status: CP
Start: 2021-03-02 — End: ?

## 2021-03-02 MED ORDER — LISINOPRIL 5 MG TABLET
ORAL_TABLET | Freq: Every day | ORAL | 1 refills | 90 days | Status: CP
Start: 2021-03-02 — End: 2022-03-02

## 2021-03-02 MED ORDER — INSULIN SYRINGE U-100 WITH NEEDLE 0.5 ML 29 GAUGE X 1/2" (12 MM)
Freq: Four times a day (QID) | INTRAMUSCULAR | 2 refills | 0 days | Status: CP
Start: 2021-03-02 — End: 2021-04-01

## 2021-03-02 MED ORDER — INSULIN DETEMIR (U-100) 100 UNIT/ML (3 ML) SUBCUTANEOUS PEN
Freq: Every day | SUBCUTANEOUS | 1 refills | 28.00000 days | Status: CP
Start: 2021-03-02 — End: 2022-08-03

## 2021-03-02 MED ORDER — GABAPENTIN 800 MG TABLET
ORAL_TABLET | Freq: Three times a day (TID) | ORAL | 1 refills | 90 days | Status: CP
Start: 2021-03-02 — End: 2022-03-02

## 2021-03-02 MED ORDER — INSULIN ASPART U-100  100 UNIT/ML SUBCUTANEOUS SOLUTION
Freq: Three times a day (TID) | SUBCUTANEOUS | 1 refills | 34 days | Status: CP
Start: 2021-03-02 — End: ?

## 2021-03-02 MED ORDER — ALCOHOL SWABS
Freq: Four times a day (QID) | TOPICAL | 1 refills | 0 days | Status: CP
Start: 2021-03-02 — End: ?

## 2021-03-05 DIAGNOSIS — E11628 Type 2 diabetes mellitus with other skin complications: Principal | ICD-10-CM

## 2021-03-05 DIAGNOSIS — Z794 Long term (current) use of insulin: Principal | ICD-10-CM

## 2021-03-05 MED ORDER — NOVOLOG U-100 INSULIN ASPART 100 UNIT/ML SUBCUTANEOUS SOLUTION
Freq: Three times a day (TID) | SUBCUTANEOUS | 12 refills | 34 days
Start: 2021-03-05 — End: 2022-03-05

## 2021-03-05 NOTE — Unmapped (Signed)
Note: [employer]'s covered (formulary) drug is non-ReliOn brand Novolog (which is the identical product as insulin aspart) and non-ReliOn brand Novolin insulin. [employer] does NOT cover ReliOn brand Novolog/Novolin or generic insulin aspart.      Novolog 100 unit/ml vial is the covered alternative is pended if you approve

## 2021-03-05 NOTE — Unmapped (Signed)
Assessment and Plan:     Cory Bentley was seen today for follow-up.    Diagnoses and all orders for this visit:    Type 2 diabetes mellitus with other skin complication, with long-term current use of insulin (CMS-HCC)    Pt A1c today is 7.1 this is down from 7.7  His 7d home glucose average is 148  He is suppose to be taking novolog 10u TID but he is only taking 8u TID   He is also suppose to be taking levelmir 52u daily but he is only taking 50u daily   I have discussed with him that taking the prescribed amount would most likely get his A1c within goal range of 7.0 or below   He verbalized understanding of need to increase   I also discussed with him healthy food choices and exercise     Pt last saw Nephrology 01/2021    -     POCT glycosylated hemoglobin (Hb A1C)  -     empagliflozin (JARDIANCE) 10 mg tablet; Take 2 tablets (20 mg total) by mouth daily.  -     insulin ASPART (NOVOLOG) 100 unit/mL injection; Inject 0.1 mL (10 Units total) under the skin Three (3) times a day before meals.  -     insulin detemir U-100 (LEVEMIR) 100 unit/mL (3 mL) injection pen; Inject 0.52 mL (52 Units total) under the skin daily.    Diabetic polyneuropathy associated with type 2 diabetes mellitus (CMS-HCC)  -     gabapentin (NEURONTIN) 800 MG tablet; Take 1 tablet (800 mg total) by mouth Three (3) times a day.    Primary hypertension    Pt BP today is 152/90  This is not at goal   Pt is currently on lisinopril 2.5mg  daily   I will increase to 5mg  daily   He denies CP, SHOB, dizziness, HA, vision changes   Pt is encouraged to keep a home BP log and bring with him to next visit for review  He is also encouraged to bring him home BP monitor to compare to the office monitor    -     lisinopriL (PRINIVIL,ZESTRIL) 5 MG tablet; Take 1 tablet (5 mg total) by mouth daily.    Hyperlipidemia associated with type 2 diabetes mellitus (CMS-HCC)  -     atorvastatin (LIPITOR) 40 MG tablet; Take 1 tablet (40 mg total) by mouth daily.    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  -     alcohol swabs (ALCOHOL PADS) PadM; Apply 1 swab topically four (4) times a day.  -     diaper,brief,adult,disposable Misc; 1 each by Miscellaneous route two (2) times a day.         Return in about 4 weeks (around 03/30/2021) for Recheck HTN.  Subjective:     HPI: Cory Bentley is a 60 y.o. male here for   Chief Complaint   Patient presents with   ??? Follow-up     Dm    :    Cory Bentley was seen today for follow-up.    Diagnoses and all orders for this visit:    Type 2 diabetes mellitus with other skin complication, with long-term current use of insulin (CMS-HCC)    Pt A1c today is 7.1 this is down from 7.7  His 7d home glucose average is 148  He is suppose to be taking novolog 10u TID but he is only taking 8u TID   He is also suppose to be taking levelmir 52u daily but he is only taking 50u daily   I have discussed with him that taking the prescribed amount would most likely get his A1c within goal range of 7.0 or below   He verbalized understanding of need to increase   I also discussed with him healthy food choices and exercise     Pt last saw Nephrology 01/2021    -     POCT glycosylated hemoglobin (Hb A1C)  -     empagliflozin (JARDIANCE) 10 mg tablet; Take 2 tablets (20 mg total) by mouth daily.  -     insulin ASPART (NOVOLOG) 100 unit/mL injection; Inject 0.1 mL (10 Units total) under the skin Three (3) times a day before meals.  -     insulin detemir U-100 (LEVEMIR) 100 unit/mL (3 mL) injection pen; Inject 0.52 mL (52 Units total) under the skin daily.    Diabetic polyneuropathy associated with type 2 diabetes mellitus (CMS-HCC)  -     gabapentin (NEURONTIN) 800 MG tablet; Take 1 tablet (800 mg total) by mouth Three (3) times a day.    Primary hypertension    Pt BP today is 152/90  This is not at goal   Pt is currently on lisinopril 2.5mg  daily   I will increase to 5mg  daily   He denies CP, SHOB, dizziness, HA, vision changes   Pt is encouraged to keep a home BP log and bring with him to next visit for review  He is also encouraged to bring him home BP monitor to compare to the office monitor    -     lisinopriL (PRINIVIL,ZESTRIL) 5 MG tablet; Take 1 tablet (5 mg total) by mouth daily.    Hyperlipidemia associated with type 2 diabetes mellitus (CMS-HCC)  -     atorvastatin (LIPITOR) 40 MG tablet; Take 1 tablet (40 mg total) by mouth daily.    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   Respiratory: Negative for chest tightness and shortness of breath.    Cardiovascular: Negative for chest pain and palpitations.   Gastrointestinal: Negative for abdominal pain, constipation, diarrhea, nausea and vomiting.   Genitourinary:        ED   Skin: Negative  for rash.   Neurological: Negative for dizziness, light-headedness and headaches.   Psychiatric/Behavioral: Negative for sleep disturbance. The patient is not nervous/anxious.         Review of systems negative unless otherwise noted as per HPI    Objective:     Visit Vitals  BP 152/90   Pulse 71   Temp 36.6 ??C (97.9 ??F) (Oral)   Ht 172.7 cm (5' 8)   Wt 94.8 kg (209 lb)   SpO2 98%   BMI 31.78 kg/m??          03/02/21 1256   PainSc:   8        Physical Exam  Constitutional:       General: He is not in acute distress.     Appearance: Normal appearance. He is well-developed and well-groomed. He is not ill-appearing.   HENT:      Head: Normocephalic and atraumatic.      Mouth/Throat:      Lips: Pink.      Mouth: Mucous membranes are moist.   Eyes:      Conjunctiva/sclera: Conjunctivae normal.   Cardiovascular:      Rate and Rhythm: Normal rate and regular rhythm.      Heart sounds: Normal heart sounds, S1 normal and S2 normal.   Pulmonary:      Effort: Pulmonary effort is normal.      Breath sounds: Normal breath sounds and air entry.   Abdominal:      General: Bowel sounds are normal.      Palpations: Abdomen is soft.      Tenderness: There is no abdominal tenderness.   Skin:     General: Skin is warm and dry.   Neurological:      Mental Status: He is alert and oriented to person, place, and time.   Psychiatric:         Attention and Perception: Attention and perception normal.         Mood and Affect: Mood and affect normal.         Speech: Speech normal.         Behavior: Behavior normal. Behavior is cooperative.         Thought Content: Thought content normal.          [facility]:     Medication adherence and barriers to the treatment plan have been addressed. Opportunities to optimize healthy behaviors have been discussed. Patient / caregiver voiced understanding.

## 2021-03-06 MED ORDER — NOVOLOG U-100 INSULIN ASPART 100 UNIT/ML SUBCUTANEOUS SOLUTION
Freq: Three times a day (TID) | SUBCUTANEOUS | 12 refills | 34 days | Status: CP
Start: 2021-03-06 — End: 2022-03-06

## 2021-03-09 LAB — TESTOSTERONE, FREE, TOTAL
TESTOSTERONE (MAYO): 207 ng/dL — ABNORMAL LOW
TESTOSTERONE FREE: 5.57 ng/dL

## 2021-03-12 MED ORDER — BD ULTRA-FINE MINI PEN NEEDLE 31 GAUGE X 3/16" (5 MM)
0 refills | 0 days | Status: CP
Start: 2021-03-12 — End: 2022-03-12

## 2021-03-12 MED ORDER — NOVOLOG FLEXPEN U-100 INSULIN ASPART 100 UNIT/ML (3 ML) SUBCUTANEOUS
Freq: Three times a day (TID) | SUBCUTANEOUS | 5 refills | 10 days | Status: CP
Start: 2021-03-12 — End: 2022-03-12

## 2021-03-12 NOTE — Unmapped (Signed)
Needles ordered

## 2021-03-13 NOTE — Unmapped (Signed)
Good morning Cory Bentley     I have reviewed your testosterone level and it is slightly low   This would indicate that you could potentially benefit from additional testosterone  With this being said, your insurance will cover the injectable which is given every 2 weeks   I do not know if you are interested in this option   Please let me know your thoughts    [name]

## 2021-03-13 NOTE — Unmapped (Signed)
Chart opened in error

## 2021-03-14 NOTE — Unmapped (Signed)
Pt called and stated he only received one pin when he usually receives a box of pins. Please contact patient for more info. Thanks.

## 2021-03-15 MED ORDER — NOVOLOG FLEXPEN U-100 INSULIN ASPART 100 UNIT/ML (3 ML) SUBCUTANEOUS
Freq: Three times a day (TID) | SUBCUTANEOUS | 3 refills | 90 days | Status: CP
Start: 2021-03-15 — End: 2022-03-15

## 2021-03-19 NOTE — Unmapped (Signed)
Please notify pt of lab result note   Thanks

## 2021-03-27 NOTE — Unmapped (Signed)
Pt called to request testosterone injection prescription to be called in please advise

## 2021-03-29 NOTE — Unmapped (Signed)
Ruxin continues to do well on his [name] - his psoriasis remains clear and he hasn't needed topical steroids.    His A1c is greatly improved, and he denies any recent infections.     [facility] Clinical Assessment & Refill Coordination Note    Jwan Hornbaker, DOB: 07-Feb-1961  Phone: [phone] (home) [phone] (work)    All above HIPAA information was verified with patient.     Was a Nurse, learning disability used for this call? No    Specialty Medication(s):   Inflammatory Disorders: Stelara     Current Outpatient Medications   Medication Sig Dispense Refill   ??? alcohol swabs (ALCOHOL PADS) PadM Apply 1 swab topically four (4) times a day. 400 each 1   ??? atorvastatin (LIPITOR) 40 MG tablet Take 1 tablet (40 mg total) by mouth daily. 90 tablet 1   ??? BD ULTRA-FINE MINI PEN NEEDLE 31 gauge x 3/16 (5 mm) Ndle Inject 4 times daily 100 each 0   ??? betamethasone, augmented, (DIPROLENE) 0.05 % gel      ??? blood sugar diagnostic Strp Accu Check Aviva Strip, use 4 times per day for monitoring insulin dependant uncontrolled diabetes     ??? blood-glucose meter kit Use as instructed 1 each 0   ??? diaper,brief,adult,disposable Misc 1 each by Miscellaneous route two (2) times a day. 20 each 2   ??? empagliflozin (JARDIANCE) 10 mg tablet Take 2 tablets (20 mg total) by mouth daily. 180 tablet 1   ??? empty container Misc Use as directed to dispose of Stelara syringes. 1 each 2   ??? flash glucose sensor (FREESTYLE LIBRE 14 DAY SENSOR) kit Apply new sensor every 14 days and use as directed to monitor and check blood sugars 2 each 12   ??? gabapentin (NEURONTIN) 800 MG tablet Take 1 tablet (800 mg total) by mouth Three (3) times a day. 270 tablet 1   ??? insulin detemir U-100 (LEVEMIR) 100 unit/mL (3 mL) injection pen Inject 0.52 mL (52 Units total) under the skin daily. 15 mL 1   ??? insulin syringe-needle U-100 0.5 mL 29 gauge x 1/2 (12 mm) Syrg Inject 1 Syringe as directed four (4) times a day. 200 each 2   ??? lisinopriL (PRINIVIL,ZESTRIL) 5 MG tablet Take 1 tablet (5 mg total) by mouth daily. 90 tablet 1   ??? NOVOLOG FLEXPEN U-100 INSULIN 100 unit/mL (3 mL) injection pen Inject 0.1 mL (10 Units total) under the skin Three (3) times a day before meals. 27 mL 3   ??? ustekinumab (STELARA) 45 mg/0.5 mL Syrg syringe Inject the contents of 1 syringe (45 mg) under the skin initially and THEN inject 1 syringe (45 mg) again 4 weeks later as loading doses. 1 mL 0   ??? ustekinumab (STELARA) 45 mg/0.5 mL Syrg syringe Inject 45 mg subcutaneous every 12 weeks 2 mL 0     No current facility-administered medications for this visit.        Changes to medications: Riese reports no changes at this time.    No Known Allergies    Changes to allergies: No    SPECIALTY MEDICATION ADHERENCE     Stelara - 0 left  Medication Adherence    Patient reported X missed doses in the last month: 0  Specialty Medication: Stelara          Specialty medication(s) dose(s) confirmed: Regimen is correct and unchanged.     Are there any concerns with adherence? No  Adherence counseling provided? Not needed    CLINICAL MANAGEMENT AND INTERVENTION      Clinical Benefit Assessment:    Do you feel the medicine is effective or helping your condition? Yes    Clinical Benefit counseling provided? Not needed    Adverse Effects Assessment:    Are you experiencing any side effects? No    Are you experiencing difficulty administering your medicine? No    Quality of Life Assessment:    Quality of Life    Rheumatology  Oncology  Dermatology  1. What impact has your specialty medication had on the symptoms of your skin condition (i.e. itchiness, soreness, stinging)?: Tremendous  2. What impact has your specialty medication had on your comfort level with your skin?: Tremendous  Cystic Fibrosis          Have you discussed this with your provider? Not needed    Acute Infection Status:    Acute infections noted within Epic:  No active infections  Patient reported infection: None    Therapy Appropriateness:    Is therapy appropriate and patient progressing towards therapeutic goals? Yes, therapy is appropriate and should be continued    DISEASE/MEDICATION-SPECIFIC INFORMATION      For patients on injectable medications: Patient currently has 0 doses left.  Next injection is scheduled for 04/18/21.    PATIENT SPECIFIC NEEDS     - Does the patient have any physical, cognitive, or cultural barriers? No    - Is the patient high risk? No    Does the patient require a Care Management Plan? No     SHIPPING     Specialty Medication(s) to be Shipped:   Inflammatory Disorders: Stelara    Other medication(s) to be shipped: No additional medications requested for fill at this time     Changes to insurance: No    Delivery Scheduled: Yes, Expected medication delivery date: 12/28.     Medication will be delivered via Same Day Courier to the confirmed prescription address in [facility].    The patient will receive a drug information handout for each medication shipped and additional FDA Medication Guides as required.  Verified that patient has previously received a Conservation officer, historic buildings and a Surveyor, mining.    The patient or caregiver noted above participated in the development of this care plan and knows that they can request review of or adjustments to the care plan at any time.      All of the patient's questions and concerns have been addressed.    Lanney Gins   [facility] Specialty Pharmacist

## 2021-03-30 ENCOUNTER — Ambulatory Visit: Admit: 2021-03-30 | Discharge: 2021-03-31 | Payer: MEDICARE

## 2021-03-30 DIAGNOSIS — I10 Essential (primary) hypertension: Principal | ICD-10-CM

## 2021-03-30 DIAGNOSIS — R7989 Other specified abnormal findings of blood chemistry: Principal | ICD-10-CM

## 2021-03-30 DIAGNOSIS — K219 Gastro-esophageal reflux disease without esophagitis: Principal | ICD-10-CM

## 2021-03-30 MED ORDER — OMEPRAZOLE 20 MG CAPSULE,DELAYED RELEASE
ORAL_CAPSULE | Freq: Every day | ORAL | 3 refills | 90 days | Status: CP
Start: 2021-03-30 — End: 2022-03-30

## 2021-03-30 NOTE — Unmapped (Signed)
Assessment and Plan:     Cory Bentley was seen today for follow-up.    Diagnoses and all orders for this visit:    Primary hypertension    Pt BP today is 134/82  This will be considered at goal   He denies CP, palpitations, SHOB, DOE, dizziness, vision changes   He is currently on lisinopril 5mg  daily   I will continue with this regimen     Gastroesophageal reflux disease, unspecified whether esophagitis present    Pt reports that 20-30 min after eating, he is having a full feeling in his throat/chest with associated burning and acid reflux into mouth   He is not having difficulty swallowing, no issue with choking with food ingestion, no coughing with food ingestion   He has not tried any OTC medications to obtain relief  I recommend omeprazole 20mg  daily   I discussed with him to stay sitting up for at least 2 hours after meals and to eat more slowly   We also discussed foods that could cause increase in reflux symptoms     -     omeprazole (PRILOSEC) 20 MG capsule; Take 1 capsule (20 mg total) by mouth daily.    Low testosterone in male    Pt reports that he is having difficulty attaining and maintaining and erection   He had went 2 years without sexual activity and now is having difficulty   He denies any penile or testicular pain/discharge  He denies any urinary symptoms   Pt does have HTN, DM, Neuropathy    Pt had low testosterone level in November   He will need serial testosterone level - He should return first thing in am for this collection   If it remains low, I will send in prescription for testosterone gel     Pt wants to know the insurance coverage for this and I have again explained to him that I do not know the cost fro the insurance side and that he will need to check with insurance concerning those questions     -     Testosterone, free, total; Future         Return in about 5 months (around 08/28/2021) for needs early am lab only appt soon and then follow up in May after 17th .    Subjective: HPI: Cory Bentley is a 60 y.o. male here for   Chief Complaint   Patient presents with   ??? Follow-up     BP    :    Cory Bentley was seen today for follow-up.    Diagnoses and all orders for this visit:    Primary hypertension    Pt BP today is 134/82  This will be considered at goal   He denies CP, palpitations, SHOB, DOE, dizziness, vision changes   He is currently on lisinopril 5mg  daily   I will continue with this regimen     Gastroesophageal reflux disease, unspecified whether esophagitis present    Pt reports that 20-30 min after eating, he is having a full feeling in his throat/chest with associated burning and acid reflux into mouth   He is not having difficulty swallowing, no issue with choking with food ingestion, no coughing with food ingestion   He has not tried any OTC medications to obtain relief  I recommend omeprazole 20mg  daily   I discussed with him to stay sitting up for at least 2 hours after meals and to eat more slowly  We also discussed foods that could cause increase in reflux symptoms     -     omeprazole (PRILOSEC) 20 MG capsule; Take 1 capsule (20 mg total) by mouth daily.    Low testosterone in male    Pt reports that he is having difficulty attaining and maintaining and erection   He had went 2 years without sexual activity and now is having difficulty   He denies any penile or testicular pain/discharge  He denies any urinary symptoms   Pt does have HTN, DM, Neuropathy    Pt had low testosterone level in November   He will need serial testosterone level - He should return first thing in am for this collection   If it remains low, I will send in prescription for testosterone gel     -     Testosterone, free, total; Future          ROS:   Review of Systems   Constitutional: Negative for activity change, appetite change, fatigue and unexpected weight change.   Respiratory: Negative for chest tightness and shortness of breath.    Cardiovascular: Negative for chest pain and palpitations.   Gastrointestinal: Negative for abdominal pain, constipation, diarrhea, nausea and vomiting.   Skin: Negative for rash.   Neurological: Negative for dizziness, light-headedness and headaches.   Psychiatric/Behavioral: Negative for sleep disturbance. The patient is not nervous/anxious.         Review of systems negative unless otherwise noted as per HPI    Objective:     Visit Vitals  BP 134/82   Pulse 89   Temp 36.7 ??C (98 ??F) (Oral)   Ht 172.7 cm (5' 8)   Wt 92.5 kg (204 lb)   SpO2 98%   BMI 31.02 kg/m??          03/30/21 1306   PainSc:   5        Physical Exam  Constitutional:       General: He is not in acute distress.     Appearance: Normal appearance. He is well-developed and well-groomed. He is not ill-appearing.   HENT:      Head: Normocephalic and atraumatic.      Mouth/Throat:      Lips: Pink.      Mouth: Mucous membranes are moist.   Eyes:      Conjunctiva/sclera: Conjunctivae normal.   Cardiovascular:      Rate and Rhythm: Normal rate and regular rhythm.      Heart sounds: Normal heart sounds, S1 normal and S2 normal.   Pulmonary:      Effort: Pulmonary effort is normal.      Breath sounds: Normal breath sounds and air entry.   Skin:     General: Skin is warm and dry.   Neurological:      Mental Status: He is alert and oriented to person, place, and time.   Psychiatric:         Attention and Perception: Attention and perception normal.         Mood and Affect: Mood and affect normal.         Speech: Speech normal.         Behavior: Behavior normal. Behavior is cooperative.         Thought Content: Thought content normal.          [facility]:     Medication adherence and barriers to the treatment plan have been addressed. Opportunities to optimize healthy behaviors have  been discussed. Patient / caregiver voiced understanding.

## 2021-04-11 ENCOUNTER — Other Ambulatory Visit: Admit: 2021-04-11 | Discharge: 2021-04-12 | Payer: MEDICARE

## 2021-04-11 DIAGNOSIS — R7989 Other specified abnormal findings of blood chemistry: Principal | ICD-10-CM

## 2021-04-11 MED FILL — STELARA 45 MG/0.5 ML SUBCUTANEOUS SYRINGE: 84 days supply | Qty: 0.5 | Fill #1

## 2021-04-16 LAB — TESTOSTERONE, FREE, TOTAL
TESTOSTERONE (MAYO): 235 ng/dL — ABNORMAL LOW
TESTOSTERONE FREE: 5.73 ng/dL

## 2021-04-25 DIAGNOSIS — R7989 Other specified abnormal findings of blood chemistry: Principal | ICD-10-CM

## 2021-04-25 MED ORDER — TESTOSTERONE 1 % (50 MG/5 GRAM) TRANSDERMAL GEL PACKET
PACK | Freq: Every day | TRANSDERMAL | 0 refills | 30 days | Status: CP
Start: 2021-04-25 — End: ?

## 2021-04-25 MED ORDER — TESTOSTERONE 20.25 MG/1.25 GRAM (1.62 %) TRANSDERMAL GEL PUMP
Freq: Every day | TRANSDERMAL | 0 refills | 0 days
Start: 2021-04-25 — End: ?

## 2021-04-25 NOTE — Unmapped (Signed)
Patients insurance will cover the 1.62% gel pump as an alternative to what was initialy prescribed.  Insurance will not cover the packets.  Please advise

## 2021-04-25 NOTE — Unmapped (Signed)
Good morning Cory Bentley     I have received the results of your 2nd testosterone test and it remains low   I will be resubmitting the testosterone gel to your insurance and hopefully they will approve this time     [name]

## 2021-04-25 NOTE — Unmapped (Signed)
Insurance PA will cover the gel pump 1.62%

## 2021-04-26 MED ORDER — TESTOSTERONE 20.25 MG/1.25 GRAM (1.62 %) TRANSDERMAL GEL PUMP
Freq: Every day | TRANSDERMAL | 0 refills | 0 days | Status: CP
Start: 2021-04-26 — End: ?

## 2021-05-01 NOTE — Unmapped (Signed)
Filed another PA even though insurance said they would cover this medicine it was approved

## 2021-05-01 NOTE — Unmapped (Signed)
Please notify pt of lab results comments   Thanks

## 2021-05-03 NOTE — Unmapped (Signed)
Testosterone 1.62% gel PA Case: [id number], Status: Approved, Coverage Starts on: 04/15/2021 12:00:00 AM, Coverage Ends on: 04/14/2022 12:00:00 AM. Questions? Contact [phone].

## 2021-06-19 ENCOUNTER — Ambulatory Visit: Admit: 2021-06-19 | Discharge: 2021-06-20 | Payer: MEDICARE

## 2021-06-19 DIAGNOSIS — L720 Epidermal cyst: Principal | ICD-10-CM

## 2021-06-19 DIAGNOSIS — Z79899 Other long term (current) drug therapy: Principal | ICD-10-CM

## 2021-06-19 DIAGNOSIS — L409 Psoriasis, unspecified: Principal | ICD-10-CM

## 2021-06-19 MED ORDER — USTEKINUMAB 45 MG/0.5 ML SUBCUTANEOUS SYRINGE
3 refills | 0 days | Status: CP
Start: 2021-06-19 — End: ?
  Filled 2021-07-04: qty 0.5, 84d supply, fill #0

## 2021-06-19 MED ORDER — HYDROCORTISONE 2.5 % TOPICAL OINTMENT
4 refills | 0.00000 days | Status: CP
Start: 2021-06-19 — End: ?

## 2021-06-19 MED ORDER — BETAMETHASONE VALERATE 0.1 % TOPICAL OINTMENT
8 refills | 0 days | Status: CP
Start: 2021-06-19 — End: ?

## 2021-06-19 NOTE — Unmapped (Signed)
DERMATOLOGY CLINIC NOTE    ASSESSMENT AND PLAN:     Psoriasis, improved but with some recalcitrance:  - Start ustekinumab (STELARA) 45 mg/0.5 mL Syrg syringe; Inject 45 mg subcutaneous every 12 weeks  Dispense: 2 mL; Refill: 3  - Start betamethasone valerate (VALISONE) 0.1 % ointment; Apply to thick areas of psoriasis twice daily as needed until smooth. Avoid face and skin folds.  Dispense: 45 g; Refill: 8  - Start hydrocortisone 2.5 % ointment; Apply to psoriasis in ears up to twice daily as needed until smooth.  Dispense: 30 g; Refill: 4  - Recommended occlusion for recalcitrant area.   - Tolerating well Stelara well.   - Discussed talk to PCP about rheumatology referral for possible arthritis.     High risk medication use  - TB test next time    Small dilated pore/epidermoid cyst on right temple:  - Recommended monitoring as small and not inflamed, given location    Return to clinic: 6 months for follow up on Stelara.        CHIEF COMPLAINT:  Follow up psoriasis     HPI:   This is a pleasant 61 y.o. male who last saw me on 12/05/20. He is on Stelara and doing well with improvement. Tolerating well. He has chronic hand pain, possible arthritis. On Gabapentin for neuropathy through PCP.      PAST MEDICAL HISTORY:  Psoriasis    Trigger fingers  Diabetes     MEDICATIONS:   Current Outpatient Medications on File Prior to Visit   Medication Sig Dispense Refill   ??? alcohol swabs (ALCOHOL PADS) PadM Apply 1 swab topically four (4) times a day. 400 each 1   ??? atorvastatin (LIPITOR) 40 MG tablet Take 1 tablet (40 mg total) by mouth daily. 90 tablet 1   ??? BD ULTRA-FINE MINI PEN NEEDLE 31 gauge x 3/16 (5 mm) Ndle Inject 4 times daily 100 each 0   ??? blood sugar diagnostic Strp Accu Check Aviva Strip, use 4 times per day for monitoring insulin dependant uncontrolled diabetes     ??? blood-glucose meter kit Use as instructed 1 each 0   ??? diaper,brief,adult,disposable Misc 1 each by Miscellaneous route two (2) times a day. 20 each 2   ??? empagliflozin (JARDIANCE) 10 mg tablet Take 2 tablets (20 mg total) by mouth daily. 180 tablet 1   ??? empty container Misc Use as directed to dispose of Stelara syringes. 1 each 2   ??? flash glucose sensor (FREESTYLE LIBRE 14 DAY SENSOR) kit Apply new sensor every 14 days and use as directed to monitor and check blood sugars 2 each 12   ??? gabapentin (NEURONTIN) 800 MG tablet Take 1 tablet (800 mg total) by mouth Three (3) times a day. 270 tablet 1   ??? insulin detemir U-100 (LEVEMIR) 100 unit/mL (3 mL) injection pen Inject 0.52 mL (52 Units total) under the skin daily. 15 mL 1   ??? lisinopriL (PRINIVIL,ZESTRIL) 5 MG tablet Take 1 tablet (5 mg total) by mouth daily. 90 tablet 1   ??? NOVOLOG FLEXPEN U-100 INSULIN 100 unit/mL (3 mL) injection pen Inject 0.1 mL (10 Units total) under the skin Three (3) times a day before meals. 27 mL 3   ??? omeprazole (PRILOSEC) 20 MG capsule Take 1 capsule (20 mg total) by mouth daily. 90 capsule 3   ??? testosterone 20.25 mg/1.25 gram (1.62 %) gel pump Place 2 sprays (40.5 mg total) on the skin daily. 75 g 0  No current facility-administered medications on file prior to visit.       ALLERGIES:   NKDA    SOCIAL HISTORY:  Journey fan     REVIEW OF SYSTEMS:  Baseline state of health. No recent illnesses. No other skin complaints.     PHYSICAL EXAMINATION:  Examination in the presence of chaperone:  General: Well-developed, well-nourished. No acute distress.   Neuro: Alert and oriented, answers questions appropriately.  Skin: Examination of the scalp, face, head, neck, chest, back, upper extremities, lower extremities, hands, palms, soles was performed and notable for the following:  Thin psoriatic plaques on elbows, lower back  Broad area of hypopigmentation on trunk     Dictation software was used while making this note. Please excuse any errors made with dictation software.

## 2021-06-20 DIAGNOSIS — L409 Psoriasis, unspecified: Principal | ICD-10-CM

## 2021-06-20 DIAGNOSIS — Z79899 Other long term (current) drug therapy: Principal | ICD-10-CM

## 2021-06-28 NOTE — Unmapped (Signed)
[facility] Refill Coordination Note    Specialty Medication(s) to be Shipped:   Inflammatory Disorders: Stelara    Other medication(s) to be shipped: No additional medications requested for fill at this time     Cory Bentley, DOB: 1961-01-11  Phone: [phone] (home) [phone] (work)      All above HIPAA information was verified with patient.     Was a Nurse, learning disability used for this call? No    Completed refill call assessment today to schedule patient's medication shipment from the [facility] [phone]).  All relevant notes have been reviewed.     Specialty medication(s) and dose(s) confirmed: Regimen is correct and unchanged.   Changes to medications: Cory Bentley reports no changes at this time.  Changes to insurance: No  New side effects reported not previously addressed with a pharmacist or physician: None reported  Questions for the pharmacist: No    Confirmed patient received a Conservation officer, historic buildings and a Surveyor, mining with first shipment. The patient will receive a drug information handout for each medication shipped and additional FDA Medication Guides as required.       DISEASE/MEDICATION-SPECIFIC INFORMATION        For patients on injectable medications: Patient currently has 0 doses left.  Next injection is scheduled for 07/11/2021.    SPECIALTY MEDICATION ADHERENCE     Medication Adherence    Patient reported X missed doses in the last month: 0  Specialty Medication: Stelara  Patient is on additional specialty medications: No  Any gaps in refill history greater than 2 weeks in the last 3 months: no  Demonstrates understanding of importance of adherence: yes  Informant: patient  Reliability of informant: reliable  Confirmed plan for next specialty medication refill: delivery by pharmacy  Refills needed for supportive medications: not needed              Were doses missed due to medication being on hold? No    Stelara 45/0.5 mg/ml: 0 days of medicine on hand REFERRAL TO PHARMACIST     Referral to the pharmacist: Not needed      [facility]     Shipping address confirmed in Epic.     Delivery Scheduled: Yes, Expected medication delivery date: 07/04/2021.     Medication will be delivered via Same Day Courier to the prescription address in Epic WAM.    [name] D [name]   [facility] Specialty Technician

## 2021-07-11 MED ORDER — ALCOHOL SWABS
0 refills | 0 days
Start: 2021-07-11 — End: ?

## 2021-07-16 MED ORDER — TESTOSTERONE 20.25 MG/1.25 GRAM PER PUMP ACT.(1.62 %) TRANSDERMAL GEL
Freq: Every day | TRANSDERMAL | 0 refills | 0.00000 days
Start: 2021-07-16 — End: 2022-07-16

## 2021-07-16 NOTE — Unmapped (Signed)
Patient is requesting the following refill  Requested Prescriptions     Pending Prescriptions Disp Refills    testosterone 20.25 mg/1.25 gram (1.62 %) gel pump 75 g 0     Sig: Place 2 sprays (40.5 mg total) on the skin daily.       Recent Visits  Date Type Provider Dept   03/30/21 Office Visit Amie Portland, FNP [facility]   03/02/21 Office Visit Amie Portland, FNP [place] [facility]   11/30/20 Office Visit Amie Portland, FNP [place] [facility]   09/28/20 Office Visit Amie Portland, FNP [place] [facility]   08/29/20 Office Visit Amie Portland, FNP [place] [facility]   Showing recent visits within past 365 days with a meds authorizing provider and meeting all other requirements  Future Appointments  Date Type Provider Dept   08/29/21 Appointment Amie Portland, FNP [place] [facility] [address] [facility]   Showing future appointments within next 365 days with a meds authorizing provider and meeting all other requirements       Labs:  Testosterone  04/11/21 5.73

## 2021-07-16 NOTE — Unmapped (Signed)
Pt called requesting refills of testosterone gel.      [id number] - [place], [place] - [address]

## 2021-07-16 NOTE — Unmapped (Signed)
Addended by: Barbaraann Boys on: 07/16/2021 01:31 PM     Modules accepted: Orders

## 2021-07-17 MED ORDER — TESTOSTERONE 20.25 MG/1.25 GRAM PER PUMP ACT.(1.62 %) TRANSDERMAL GEL
Freq: Every day | TRANSDERMAL | 0 refills | 0 days | Status: CP
Start: 2021-07-17 — End: 2022-07-17

## 2021-07-17 NOTE — Unmapped (Signed)
Addended by: Herschell Dimes on: 07/17/2021 10:12 AM     Modules accepted: Orders

## 2021-07-20 ENCOUNTER — Ambulatory Visit: Admit: 2021-07-20 | Discharge: 2021-07-21 | Payer: MEDICARE

## 2021-07-20 DIAGNOSIS — R809 Proteinuria, unspecified: Principal | ICD-10-CM

## 2021-07-20 DIAGNOSIS — I10 Essential (primary) hypertension: Principal | ICD-10-CM

## 2021-07-20 LAB — PROTEIN / CREATININE RATIO, URINE
CREATININE, URINE: 59 mg/dL
PROTEIN URINE: 86.6 mg/dL
PROTEIN/CREAT RATIO, URINE: 1.468

## 2021-07-20 LAB — RENAL FUNCTION PANEL
ALBUMIN: 3.8 g/dL (ref 3.4–5.0)
ANION GAP: 7 mmol/L (ref 5–14)
BLOOD UREA NITROGEN: 20 mg/dL (ref 9–23)
BUN / CREAT RATIO: 21
CALCIUM: 9.3 mg/dL (ref 8.7–10.4)
CHLORIDE: 104 mmol/L (ref 98–107)
CO2: 28 mmol/L (ref 20.0–31.0)
CREATININE: 0.96 mg/dL
EGFR CKD-EPI (2021) MALE: >90 mL/min/{1.73_m2} (ref >=60)
GLUCOSE RANDOM: 193 mg/dL — ABNORMAL HIGH (ref 70–179)
PHOSPHORUS: 3 mg/dL (ref 2.4–5.1)
POTASSIUM: 5.9 mmol/L — ABNORMAL HIGH (ref 3.4–4.8)
SODIUM: 139 mmol/L (ref 135–145)

## 2021-07-23 ENCOUNTER — Ambulatory Visit: Admit: 2021-07-23 | Discharge: 2021-07-25 | Disposition: A | Discharge status: Home / Self Care | Payer: MEDICARE

## 2021-07-23 LAB — COMPREHENSIVE METABOLIC PANEL
ALBUMIN/GLOBULIN RATIO: 1.6
ALBUMIN: 3.8 g/dL (ref 3.1–4.7)
ALKALINE PHOSPHATASE: 74 U/L (ref 33–120)
ALT (SGPT): 17 U/L
ANION GAP: 10 mmol/L (ref 5–16)
AST (SGOT): 23 U/L (ref 10–40)
BILIRUBIN TOTAL: 0.4 mg/dL (ref 0.2–1.2)
BLOOD UREA NITROGEN: 27 mg/dL — ABNORMAL HIGH (ref 8–21)
BUN / CREAT RATIO: 25 — ABNORMAL HIGH (ref 6–20)
CALCIUM: 8.8 mg/dL (ref 8.5–10.4)
CHLORIDE: 105 mmol/L (ref 98–110)
CO2: 24 mmol/L (ref 24.0–31.0)
CREATININE: 1.1 mg/dL (ref 0.50–1.40)
EGFR CKD-EPI (2021) MALE: 78 mL/min/{1.73_m2} (ref >=60)
GLOBULIN, TOTAL: 2.4 g/dL
GLUCOSE RANDOM: 79 mg/dL (ref 75–110)
OSMOLALITY CALCULATED: 282 mosm/kg
POTASSIUM: 4.3 mmol/L (ref 3.5–5.3)
PROTEIN TOTAL: 6.2 g/dL (ref 6.0–8.0)
SODIUM: 139 mmol/L (ref 135–153)

## 2021-07-23 LAB — CBC W/ AUTO DIFF
BASOPHILS ABSOLUTE COUNT: 0.1 10*9/L (ref 0.0–0.4)
BASOPHILS RELATIVE PERCENT: 0.5 %
EOSINOPHILS ABSOLUTE COUNT: 0.9 10*9/L — ABNORMAL HIGH (ref 0.0–0.7)
EOSINOPHILS RELATIVE PERCENT: 9.2 %
HEMATOCRIT: 41.7 % — ABNORMAL LOW
HEMOGLOBIN: 13.2 g/dL — ABNORMAL LOW
IMMATURE GRANULOCYTES ABSOLUTE COUNT: 0 10*9/L (ref 0.0–0.6)
IMMATURE GRANULOCYTES RELATIVE PERCENT: 0.3 %
IMMATURE PLATELET FRACTION: 24 % — ABNORMAL HIGH (ref 1–7)
LYMPHOCYTES ABSOLUTE COUNT: 2.7 10*9/L (ref 1.2–3.4)
LYMPHOCYTES RELATIVE PERCENT: 26.8 %
MEAN CORPUSCULAR HEMOGLOBIN CONC: 31.7 g/dL (ref 31.0–35.5)
MEAN CORPUSCULAR HEMOGLOBIN: 29.4 pg (ref 26.0–34.0)
MEAN CORPUSCULAR VOLUME: 92.9 fL
MEAN PLATELET VOLUME: 13.9 fL — ABNORMAL HIGH (ref 7.0–10.0)
MONOCYTES ABSOLUTE COUNT: 1 10*9/L — ABNORMAL HIGH (ref 0.1–0.5)
MONOCYTES RELATIVE PERCENT: 9.8 %
NEUTROPHILS ABSOLUTE COUNT: 5.3 10*9/L (ref 1.4–6.5)
NEUTROPHILS RELATIVE PERCENT: 53.4 %
PLATELET COUNT: 168 10*9/L (ref 130–400)
RED BLOOD CELL COUNT: 4.49 10*12/L — ABNORMAL LOW
RED CELL DISTRIBUTION WIDTH: 13.7 % (ref 11.5–15.5)
WBC ADJUSTED: 9.9 10*9/L (ref 4.8–10.8)

## 2021-07-24 LAB — BASIC METABOLIC PANEL
ANION GAP: 10 mmol/L (ref 5–16)
BLOOD UREA NITROGEN: 24 mg/dL — ABNORMAL HIGH (ref 8–21)
BUN / CREAT RATIO: 34 — ABNORMAL HIGH (ref 6–20)
CALCIUM: 8.7 mg/dL (ref 8.5–10.4)
CHLORIDE: 109 mmol/L (ref 98–110)
CO2: 23 mmol/L — ABNORMAL LOW (ref 24.0–31.0)
CREATININE: 0.7 mg/dL (ref 0.50–1.40)
EGFR CKD-EPI (2021) MALE: >90 mL/min/{1.73_m2} (ref >=60)
GLUCOSE RANDOM: 116 mg/dL — ABNORMAL HIGH (ref 75–110)
POTASSIUM: 4.2 mmol/L (ref 3.5–5.3)
SODIUM: 142 mmol/L (ref 135–153)

## 2021-07-24 LAB — PROTIME-INR
INR: 1.02 (ref 0.90–1.10)
PROTIME: 10.5 s (ref 9.3–11.5)

## 2021-07-24 LAB — CBC
HEMATOCRIT: 41.4 % — ABNORMAL LOW
HEMOGLOBIN: 13 g/dL — ABNORMAL LOW
MEAN CORPUSCULAR HEMOGLOBIN CONC: 31.4 g/dL (ref 31.0–35.5)
MEAN CORPUSCULAR HEMOGLOBIN: 29.1 pg (ref 26.0–34.0)
MEAN CORPUSCULAR VOLUME: 92.8 fL
MEAN PLATELET VOLUME: 13.8 fL — ABNORMAL HIGH (ref 7.0–10.0)
PLATELET COUNT: 154 10*9/L (ref 130–400)
RED BLOOD CELL COUNT: 4.46 10*12/L — ABNORMAL LOW
RED CELL DISTRIBUTION WIDTH: 13.4 % (ref 11.5–15.5)
WBC ADJUSTED: 8.9 10*9/L (ref 4.8–10.8)

## 2021-07-24 LAB — HEMOGLOBIN A1C
ESTIMATED AVERAGE GLUCOSE: 169 mg/dL
HEMOGLOBIN A1C: 7.5 % — ABNORMAL HIGH (ref 4.7–5.6)

## 2021-07-24 LAB — APTT: APTT: 27.3 s (ref 25.0–37.0)

## 2021-07-24 LAB — LACTATE SEPSIS: LACTATE: 0.7 mmol/L (ref 0.5–2.0)

## 2021-07-24 LAB — URIC ACID: URIC ACID: 5.7 mg/dL

## 2021-07-24 MED ADMIN — gabapentin (NEURONTIN) capsule 800 mg: 800 mg | ORAL | @ 13:00:00

## 2021-07-24 MED ADMIN — enoxaparin (LOVENOX) syringe 40 mg: 40 mg | SUBCUTANEOUS | @ 13:00:00

## 2021-07-24 MED ADMIN — pantoprazole (PROTONIX) EC tablet 20 mg: 20 mg | ORAL | @ 13:00:00

## 2021-07-24 MED ADMIN — insulin lispro (HumaLOG) injection 10 Units: 10 [IU] | SUBCUTANEOUS | @ 13:00:00

## 2021-07-24 MED ADMIN — gabapentin (NEURONTIN) capsule 800 mg: 800 mg | ORAL | @ 18:00:00

## 2021-07-24 MED ADMIN — hydrALAZINE (APRESOLINE) injection 10 mg: 10 mg | INTRAVENOUS | @ 09:00:00 | Stop: 2021-07-24

## 2021-07-24 MED ADMIN — insulin glargine (BASAGLAR, LANTUS) injection pen 52 Units: 52 [IU] | SUBCUTANEOUS | @ 13:00:00

## 2021-07-24 MED ADMIN — vancomycin 1,500 mg in sodium chloride (NS) 0.9 % 500 mL IVPB-connector bag: 1500 mg | INTRAVENOUS | @ 17:00:00 | Stop: 2021-07-31

## 2021-07-24 MED ADMIN — lisinopriL (PRINIVIL,ZESTRIL) tablet 20 mg: 20 mg | ORAL | @ 13:00:00

## 2021-07-24 MED ADMIN — vancomycin (VANCOCIN) 1,750 mg in sodium chloride (NS) 0.9 % 500 mL IVPB: 1750 mg | INTRAVENOUS | @ 05:00:00 | Stop: 2021-07-24

## 2021-07-24 MED ADMIN — atorvastatin (LIPITOR) tablet 40 mg: 40 mg | ORAL | @ 13:00:00

## 2021-07-24 NOTE — Unmapped (Signed)
Pharmacy has been consulted to dose vancomycin for Cory Bentley who is a 61 y.o. male for  cellulitis in a diabetic patient .    Objective History     Patient weight not recorded from this encounter  Prior encounter weight 92.5 kg (03/30/21)  Using Scr from this admission, calculated CrCl at 95.8 ml/min    CrCl cannot be calculated (Unknown ideal weight.).  No intake/output data recorded.    Recent Labs     07/23/21  1909   CREATININE 1.10   BUN 27*   WBC 9.9     No components found for: Jannifer Rodney, LOWERRESPCX  Culture   Date Value Ref Range Status   04/03/2018 STAPHYLOCOCCUS AUREUS  Final     Comment:     Staphylococcus aureus  Refer to the first hand wound culture collected omn 04/03/18 at 12:27 PM for susceptibility testing results.     04/03/2018 No Anaerobes Isolated  Final   04/03/2018 STAPHYLOCOCCUS AUREUS  Final     Comment:     Moderate Growth Staphylococcus aureus   04/03/2018 No Anaerobes Isolated  Final   03/24/2017 STAPHYLOCOCCUS AUREUS  Final     Comment:     Heavy Growth Staphylococcus aureus   03/24/2017 No Anaerobes Isolated  Final     Temp Readings from Last 3 Encounters:   07/23/21 36.8 ??C (98.2 ??F) (Oral)   03/30/21 36.7 ??C (98 ??F) (Oral)   03/02/21 36.6 ??C (97.9 ??F) (Oral)         Assessment/Plan  The patient received 1750 mg x 1 in the ED, and will now be started on  1500 mg  (16.2mg /kg/dose) every 12 hours. Plan for trough as patient approaches steady state, prior to the 4th dose.  Serum creatinine will be ordered per policy. Due to infection severity, will target a trough of 10-15 ug/mL.  Pharmacy will follow the patient???s culture results and clinical progress daily.    Trough/SCr scheduled for 4/12 at 1100    [facility] [employer]

## 2021-07-24 NOTE — Unmapped (Signed)
Respiratory Therapy Evaluation      Current Therapy: HHN PRN    Respiratory Assessment Findings: NEGATIVE    Sputum Production:  NONE    Chest X-ray Findings:   XR Foot 3 Or More Views Left   Final Result   1.  No acute abnormality or radiographic evidence of osteomyelitis in the left foot.   Electronically signed by:  Reed Pandy MD  07/24/2021 12:11 AM CDT Workstation: [id number]            Vitals:  Blood pressure 152/89, pulse 85, temperature 36.8 ??C (98.2 ??F), temperature source Oral, resp. rate 16, SpO2 98 %.    Oxygen Therapy:  ROOM AIR    Arterial Blood Gas result: No results in the last day    Therapeutic Objectives: RETURN TO BASELINE     Recommendations:  DISCONTINUE PRN NEBULIZER. PT HAS NO RESPIRATORY ISSUES, NO HX. PT IN NO RESPIRATORY DISTRESS AT THIS TIME.

## 2021-07-24 NOTE — Unmapped (Signed)
Pt stated that today he noticed some redness to his big toe on his left foot. Pt denies any known injury. Pt stated that he is a diabetic and he wants to make sure he does not have an infection.

## 2021-07-24 NOTE — Unmapped (Signed)
Problem: Skin Injury Risk Increased  Goal: Skin Health and Integrity  Outcome: Progressing     Problem: Fall Injury Risk  Goal: Absence of Fall and Fall-Related Injury  Outcome: Progressing     Problem: Adult Inpatient Plan of Care  Goal: Plan of Care Review  Outcome: Progressing  Goal: Patient-Specific Goal (Individualized)  Outcome: Progressing  Goal: Absence of Hospital-Acquired Illness or Injury  Outcome: Progressing  Goal: Optimal Comfort and Wellbeing  Outcome: Progressing  Goal: Readiness for Transition of Care  Outcome: Progressing  Goal: Rounds/Family Conference  Outcome: Progressing

## 2021-07-24 NOTE — Unmapped (Signed)
Problem: Adult Inpatient Plan of Care  Goal: Readiness for Transition of Care  Outcome: Progressing  Intervention: Mutually Develop Transition Plan  Flowsheets (Taken 07/24/2021 1620)  Concerns to be Addressed:   no discharge needs identified   denies needs/concerns at this time   Patient will return home at dc. No CM needs identified at this time.

## 2021-07-24 NOTE — Unmapped (Signed)
Diabetes Educator Consult/Progress Note    07/24/2021  Cory Bentley  [id number]      Cory Bentley is a 61 y.o. male with a history of   Patient Active Problem List   Diagnosis   ??? Cellulitis of right hand   ??? 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 Primary hypertension   ??? Gastroesophageal reflux disease   ??? Normocytic anemia   ??? Thrombocytosis   ??? Marijuana abuse   ??? 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 Psoriasis   ??? Diabetic polyneuropathy associated with type 2 diabetes mellitus (CMS-HCC)   ??? Substance abuse (CMS-HCC)   ??? Iron deficiency anemia   ??? History of skin graft   ??? History of colonic polyps   ??? Gastroparesis due to DM (CMS-HCC)   ??? Family history of colon cancer   ??? Hyperlipidemia associated with type 2 diabetes mellitus (CMS-HCC)   ??? Presumed diabetic foot infection (CMS-HCC)   ??? Cellulitis of left foot   .      Past Medical History:   Diagnosis Date   ??? Diabetes mellitus (CMS-HCC)    ??? Hypertension    ??? Psoriasis    ??? Substance abuse (CMS-HCC)        Medications Prior to Admission   Medication Sig Dispense Refill Last Dose   ??? lisinopriL (PRINIVIL,ZESTRIL) 5 MG tablet Take 1 tablet (5 mg total) by mouth daily. 90 tablet 1 07/24/2021   ??? alcohol swabs (ALCOHOL PADS) PadM Apply 1 swab topically four (4) times a day. 400 each 1    ??? atorvastatin (LIPITOR) 40 MG tablet Take 1 tablet (40 mg total) by mouth daily. 90 tablet 1    ??? BD ULTRA-FINE MINI PEN NEEDLE 31 gauge x 3/16 (5 mm) Ndle Inject 4 times daily 100 each 0    ??? betamethasone valerate (VALISONE) 0.1 % ointment Apply to thick areas of psoriasis twice daily as needed until smooth. Avoid face and skin folds. 45 g 8    ??? blood sugar diagnostic Strp Accu Check Aviva Strip, use 4 times per day for monitoring insulin dependant uncontrolled diabetes      ??? blood-glucose meter kit Use as instructed 1 each 0    ??? diaper,brief,adult,disposable Misc 1 each by Miscellaneous route two (2) times a day. 20 each 2    ??? empagliflozin (JARDIANCE) 10 mg tablet Take 2 tablets (20 mg total) by mouth daily. 180 tablet 1    ??? empty container Misc Use as directed to dispose of Stelara syringes. 1 each 2    ??? flash glucose sensor (FREESTYLE LIBRE 14 DAY SENSOR) kit Apply new sensor every 14 days and use as directed to monitor and check blood sugars 2 each 12    ??? gabapentin (NEURONTIN) 800 MG tablet Take 1 tablet (800 mg total) by mouth Three (3) times a day. 270 tablet 1    ??? hydrocortisone 2.5 % ointment Apply to psoriasis in ears up to twice daily as needed until smooth. 30 g 4    ??? insulin detemir U-100 (LEVEMIR) 100 unit/mL (3 mL) injection pen Inject 0.52 mL (52 Units total) under the skin daily. 15 mL 1    ??? NOVOLOG FLEXPEN U-100 INSULIN 100 unit/mL (3 mL) injection pen Inject 0.1 mL (10 Units total) under the skin Three (3) times a day before meals. 27 mL 3    ??? omeprazole (PRILOSEC) 20 MG capsule Take 1 capsule (  20 mg total) by mouth daily. 90 capsule 3    ??? testosterone 20.25 mg/1.25 gram (1.62 %) gel pump Place 2 sprays (40.5 mg total) on the skin daily. 75 g 0    ??? ustekinumab (STELARA) 45 mg/0.5 mL Syrg syringe Inject the contents of 1 syringe (45 mg) under the skin every 12 weeks 2 mL 3        Current Facility-Administered Medications   Medication Dose Route Frequency Provider Last Rate Last Admin   ??? acetaminophen (TYLENOL) tablet 650 mg  650 mg Oral Q4H PRN Abosede Dalbert [place], PA       ??? aluminum-magnesium hydroxide-simethicone (MAALOX MAX) 80-80-8 mg/mL oral suspension  30 mL Oral Q4H PRN Abosede Dalbert [place], PA       ??? atorvastatin (LIPITOR) tablet 40 mg  40 mg Oral Daily [place], [place]   40 mg at 07/24/21 0906   ??? dextrose (D10W) 10% bolus 125 mL  12.5 g Intravenous Q10 Min PRN Abosede Dalbert [place], [place]       ??? dextrose (GLUTOSE) 40 % gel 15 g of dextrose  15 g of dextrose Oral Q10 Min PRN Abosede Dalbert [place], [place]       ??? enoxaparin (LOVENOX) syringe 40 mg  40 mg Subcutaneous Q24H [facility] Prathyusha Gudapati, DO   40 mg at 07/24/21 0906   ??? gabapentin (NEURONTIN) capsule 800 mg  800 mg Oral TID [place], PA   800 mg at 07/24/21 1610   ??? glucagon injection 1 mg  1 mg Intramuscular Once PRN Abosede Dalbert [place], PA       ??? hydrALAZINE (APRESOLINE) injection 10 mg  10 mg Intravenous Q6H PRN Prathyusha Gudapati, DO       ??? insulin glargine (BASAGLAR, LANTUS) injection pen 52 Units  52 Units Subcutaneous Daily [place], [place]   96 Units at 07/24/21 0454   ??? insulin lispro (HumaLOG) injection 10 Units  10 Units Subcutaneous TID AC Abosede Dalbert [place], [place]   10 Units at 07/24/21 0908   ??? lisinopriL (PRINIVIL,ZESTRIL) tablet 20 mg  20 mg Oral Daily Anmar Al-Sultani, MD   20 mg at 07/24/21 0906   ??? magnesium hydroxide (MILK OF MAGNESIA) oral suspension  30 mL Oral Q6H PRN Abosede Dalbert [place], PA       ??? melatonin tablet 3 mg  3 mg Oral Nightly PRN Abosede Dalbert [place], PA       ??? ondansetron (ZOFRAN-ODT) disintegrating tablet 8 mg  8 mg Oral Q8H PRN Abosede Dalbert [place], PA        Or   ??? ondansetron [facility]) injection 4 mg  4 mg Intravenous Q8H PRN Abosede Dalbert [place], PA       ??? pantoprazole (PROTONIX) EC tablet 20 mg  20 mg Oral Daily [place], [place]   20 mg at 07/24/21 0981   ??? polyethylene glycol (MIRALAX) packet 17 g  17 g Oral Daily PRN Abosede Dalbert [place], PA       ??? vancomycin 1,500 mg in sodium chloride (NS) 0.9 % 500 mL IVPB-connector bag  1,500 mg Intravenous Q12H Wyline Beady, MD 333.3 mL/hr at 07/24/21 1240 1,500 mg at 07/24/21 1240        Labs:   Hemoglobin A1C   Date Value Ref Range Status   07/24/2021 7.5 (H) 4.7 - 5.6 % Final   03/02/2021 7.1 (A) 4 - 6 % Final   03/27/2019 14.2 (H) 4.7 - 5.6 % Final  BGM:    Glucose, POC   Date/Time Value Ref Range Status   07/24/2021 11:09 AM 97 80 - 110 mg/dL Final   78/29/5621 30:86 AM 126 (H) 80 - 110 mg/dL Final   57/84/6962 95:28 AM 121 (H) 80 - 110 mg/dL Final       Wt Readings from Last 1 Encounters:   07/24/21 92.3 kg (203 lb 7 oz)       BP 139/76  - Pulse 92  - Temp 36.8 ??C (98.2 ??F) (Oral)  - Resp 18  - Ht 172.7 cm (5' 8)  - Wt 92.3 kg (203 lb 7 oz)  - SpO2 98%  - BMI 30.93 kg/m??        Social History     Socioeconomic History   ??? Marital status: Single   Tobacco Use   ??? Smoking status: Never   ??? Smokeless tobacco: Never   Vaping Use   ??? Vaping Use: Never used   Substance and Sexual Activity   ??? Alcohol use: Yes     Comment: 2 beers/day average   ??? Drug use: Yes     Frequency: 7.0 times per week     Types: Marijuana     Comment: h/o cocaine abuse; daily marijuana   ??? Sexual activity: Not Currently   Other Topics Concern   ??? Do you use sunscreen? Yes   ??? Tanning bed use? No   ??? Are you easily burned? No   ??? Excessive sun exposure? Yes   ??? Blistering sunburns? No   Social History Narrative    Hx of cocaine abuse.  Denies tobacco.     Social Determinants of Health     Financial Resource Strain: Low Risk    ??? Difficulty of Paying Living Expenses: Not very hard   Food Insecurity: No Food Insecurity   ??? Worried About Running Out of Food in the Last Year: Never true   ??? Ran Out of Food in the Last Year: Never true   Transportation Needs: No Transportation Needs   ??? Lack of Transportation (Medical): No   ??? Lack of Transportation (Non-Medical): No   Physical Activity: Insufficiently Active   ??? Days of Exercise per Week: 3 days   ??? Minutes of Exercise per Session: 40 min   Stress: No Stress Concern Present   ??? Feeling of Stress : Only a little   Social Connections: Unknown   ??? Frequency of Communication with Friends and Family: More than three times a week   ??? Frequency of Social Gatherings with Friends and Family: More than three times a week   ??? Attends Religious Services: Patient refused   ??? Active Member of Clubs or Organizations: Patient refused   ??? Attends Banker Meetings: Patient refused   ??? Marital Status: Divorced       I met with Patient  to discuss diabetes.    Patient understands: disease process, causative factors, complication, and management related to, s/s of hyper/hypoglycemia, strategies to prevent infection, strategies to prevent skin breakdown and treatment regimen and the importance of adherence.    Blood glucose monitoring:  home meter.    Blood sugar pattern review:  hypoglycemia: recognition of signs/symptoms and management and hyperglycemia: recognition of signs/symptoms and management.    Medications:     Oral medications: reviewed use of Jardiance including when to take medication, action of medication, and possible side effects.    Insulin:  Levemir and Novolog pen and patient/family able to  demonstrate correct injection technique.    Dietary instructions/recommendations:  60 carbohydrate grams per meal.    Foot Care:  guidelines reviewed with patient/family. and written instructions provided..    Educational materials provided to patient/family:  verbal instruction on topics noted above., educational material provided to support verbal instruction included: AVS and handout.    Follow up plan after discharge:  not known at this time; written instructions will be provided at discharge..    Assessment: Pt reports to taking Jardiance 10 mg daily, Levemir 52 units daily and Novolog 8-10 units TIDAC. Pt wears Freestyle Libre CGM. Pt currently has CGM attached to right upper arm. Placed approximally 1 weeks ago per pt. Documented in [facility] avatar. Pt has history of diabetic foot wounds requiring amputation.     Education: Discussed the ABC's of cardiovascular health and risk factor reductions: A (A1C), B (Blood pressure), C (Cholesterol).    He was educated on diabetes and related complications and need to control hyperglycemia with emphasis on wound healing and proper foot care.  Discussed pharmacokinetics of long and short acting insulins. We discussed dangers of hypoglycemia and hyperglycemia in relation to diabetes and monitoring. Explained the rule of 15 and the proper treatment of hypoglycemia.    Barriers to care: none evident    Recommendations:     While hospitalized:  1. Inpatient POC glucose goal between 110 and 180 mg/dl         [place] 07/24/2021 2:05 PM

## 2021-07-24 NOTE — Unmapped (Signed)
Initial Nutrition Assessment  Note Type: Assessment  Visit Type: Per Protocol  Reason for Visit: Assessment (Nutrition), Education (Nutrition)    Assessment:  Cory Bentley is a 61 y.o. year old male admitted on 07/23/2021 with:  Diabetic foot infection (CMS-HCC) [E11.628, L08.9]     Patient Active Problem List   Diagnosis   ??? Cellulitis of right hand   ??? 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 Primary hypertension   ??? Gastroesophageal reflux disease   ??? Normocytic anemia   ??? Thrombocytosis   ??? Marijuana abuse   ??? 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 Psoriasis   ??? Diabetic polyneuropathy associated with type 2 diabetes mellitus (CMS-HCC)   ??? Substance abuse (CMS-HCC)   ??? Iron deficiency anemia   ??? History of skin graft   ??? History of colonic polyps   ??? Gastroparesis due to DM (CMS-HCC)   ??? Family history of colon cancer   ??? Hyperlipidemia associated with type 2 diabetes mellitus (CMS-HCC)   ??? Presumed diabetic foot infection (CMS-HCC)   ??? Cellulitis of left foot     Height: 172.7 cm (5' 8)   Weight: 92.3 kg (203 lb 7 oz)   BMI: (!) 30.94   IBW: 69.92 kg   Patient-Reported UBW: no significant weight changes    Current Nutrition Therapy:  Nutrition Orders          Nutrition Therapy Consistent Carb; Consistent Carb 45/45/45 (3/3/3); Sodium Restricted (2 gm Na+) starting at 04/11 0446      Spoke with patient at bedside about A1C. Mentioned that the highest it has been is in the 14-15 range. His previous reading in 2020 was 14.2, today (4/11) It was 7.5. Reported that he has met with a dietitian before at a different facility and that she educated him on food choices and CHO. Said he felt confident about managing his BG levels at home with diet. Along with resisting urges for sweets, the biggest hurdle for him is financial. Eats out for a lot of meals so I showed him the [employer] app and how he can use that to look up items at restaurants and calculate the CHO amount. Discussed CHO counting and the appropriate number of servings he should aim for per meal. Provided a handout covering label reading, food groups, portion sizes, and meal recommendation for total CHO intake.     Appetite: good  Difficulty chewing/swallowing: no  Nausea/Vomiting: no  Diarrhea/Constipation: no    Weight Changes: no significant weight changes  Wt Readings from Last 3 Encounters:   07/24/21 92.3 kg (203 lb 7 oz)   03/30/21 92.5 kg (204 lb)   03/02/21 94.8 kg (209 lb)           Diagnosis:  Altered nutrition-related laboratory values POC Glucose as related to T2DM as evidenced by A1C 7.5      Intervention(s):  Continue current nutrition therapy  Provided nutrition education on appropriate foods and portion sizes, CHO counting  Nutrition Goals:  Patient to consume greater than 50 % of daily meals  Patient will achieve glucose levels between 140 mg/dl and 161 mg/dl daily.    Monitoring/Evaluation:  Monitor effectiveness of current nutrition therapy order(s)    Follow-Up Date: 4/18

## 2021-07-24 NOTE — Unmapped (Signed)
[facility]       Dauntae Amir  61 y.o.  male   LOS: 0 days       SUBJECTIVE     Interval history:    Patient was seen and examined at bedside this morning. No acute events overnight per nursing staff or patient. Reports doing well this morning. No complaints of pain.  Patient reports living in [place] and coming to [place] to help his mother with a spring yard work.  He noticed that his left great toe was turning red and looked infected, which prompted his presentation to the hospital.  He denies pain in the toe but that is secondary to his diabetic neuropathy.  He has a history of poorly controlled diabetes and has had a right second toe amputation. He has not noticed any discharge from his toe. He denies any fevers, chills, chest pain, nausea, vomiting, abdominal pain.      Histories: The patient's medical, surgical, allergy, social and family histories were reviewed.    Review of System:    Review of Systems   Constitutional:  Negative for chills and fever.   Respiratory:  Negative for cough and shortness of breath.    Cardiovascular:  Negative for chest pain and palpitations.   Gastrointestinal:  Negative for blood in stool, diarrhea, nausea and vomiting.   Musculoskeletal:  Negative for arthralgias and myalgias.   Skin:  Negative for rash and wound.   Neurological:  Positive for numbness (bilateral feet). Negative for weakness and headaches.       OBJECTIVE     Vital Signs:  Temp:  [36.4 ??C (97.6 ??F)-36.8 ??C (98.2 ??F)] 36.8 ??C (98.2 ??F)  Heart Rate:  [80-97] 92  Resp:  [16-18] 18  BP: (139-185)/(76-95) 139/76  MAP (mmHg):  [97-125] 97  SpO2:  [97 %-99 %] 98 %  BMI (Calculated):  [30.94] 30.94  I/O last 3 completed shifts:  In: 557.5 [IV Piggyback:557.5]  Out: -   I/O this shift:  In: 488 [P.O.:488]  Out: -     Physical Exam :    Physical Exam  Constitutional:       General: He is not in acute distress.     Appearance: He is not ill-appearing.   Cardiovascular:      Rate and Rhythm: Normal rate and regular rhythm.      Pulses:           Dorsalis pedis pulses are 1+ on the right side and 1+ on the left side.        Posterior tibial pulses are 1+ on the right side and 1+ on the left side.      Heart sounds: Normal heart sounds. No murmur heard.  Pulmonary:      Effort: Pulmonary effort is normal. No respiratory distress.      Breath sounds: Normal breath sounds. No wheezing or rhonchi.   Abdominal:      General: Bowel sounds are normal. There is no distension.      Palpations: Abdomen is soft.      Tenderness: There is no abdominal tenderness. There is no guarding.   Musculoskeletal:         General: Normal range of motion.      Right lower leg: No edema.      Left lower leg: No edema.      Right Lower Extremity: (left second toe)  Feet:      Right foot:  Skin integrity: No ulcer, skin breakdown, erythema or warmth.      Toenail Condition: Right toenails are normal.      Left foot:      Skin integrity: Erythema (right great toe) and warmth (right great toe) present. No ulcer or skin breakdown.      Toenail Condition: Left toenails are normal.      Comments: Left great toe swollen, erythematous, and warm. No sensation of pain secondary to diabetic neuropathy. Reports a baseline numbness and tingling bilaterally.   Skin:     General: Skin is warm and dry.      Capillary Refill: Capillary refill takes less than 2 seconds.      Findings: No rash.   Neurological:      General: No focal deficit present.      Mental Status: He is alert and oriented to person, place, and time.      Sensory: No sensory deficit.      Motor: No weakness.       Labs:  Recent Results (from the past 24 hour(s))   Comprehensive Metabolic Panel    Collection Time: 07/23/21  7:09 PM   Result Value Ref Range    Sodium 139 135 - 153 mmol/L    Potassium 4.3 3.5 - 5.3 mmol/L    Chloride 105 98 - 110 mmol/L    CO2 24.0 24.0 - 31.0 mmol/L    Anion Gap 10 5 - 16 mmol/L    BUN 27 (H) 8 - 21 mg/dL    Creatinine 1.61 0.96 - 1.40 mg/dL    BUN/Creatinine Ratio 25 (H) 6 - 20    eGFR CKD-EPI (2021) Male 78 >=60 mL/min/1.38m2    Glucose 79 75 - 110 mg/dL    Calcium 8.8 8.5 - 04.5 mg/dL    Albumin 3.8 3.1 - 4.7 g/dL    Total Protein 6.2 6.0 - 8.0 g/dL    Total Bilirubin 0.4 0.2 - 1.2 mg/dL    AST 23 10 - 40 U/L    ALT 17 3 - 36 U/L    Alkaline Phosphatase 74 33 - 120 U/L    Globulin, Total 2.4 g/dL    Osmolality Calculated 282 mOsm/kg    Albumin/Globulin Ratio 1.6    CBC w/ Differential    Collection Time: 07/23/21  7:09 PM   Result Value Ref Range    WBC 9.9 4.8 - 10.8 10*9/L    RBC 4.49 (L) 4.70 - 6.10 10*12/L    HGB 13.2 (L) 14.0 - 18.0 g/dL    HCT 40.9 (L) 81.1 - 52.0 %    MCV 92.9 80.0 - 104.0 fL    MCH 29.4 26.0 - 34.0 pg    MCHC 31.7 31.0 - 35.5 g/dL    RDW 91.4 78.2 - 95.6 %    MPV 13.9 (H) 7.0 - 10.0 fL    Platelet 168 130 - 400 10*9/L    Immature Platelet Fraction 24 (H) 1 - 7 %    Neutrophils % 53.4 %    Immature Granulocytes Relative Percent 0.3 %    Lymphocytes % 26.8 %    Monocytes % 9.8 %    Eosinophils % 9.2 %    Basophils % 0.5 %    Absolute Neutrophils 5.3 1.4 - 6.5 10*9/L    Immature Granulocytes Absolute Count 0.0 0.0 - 0.6 10*9/L    Absolute Lymphocytes 2.7 1.2 - 3.4 10*9/L    Absolute Monocytes 1.0 (H) 0.1 - 0.5 10*9/L  Absolute Eosinophils 0.9 (H) 0.0 - 0.7 10*9/L    Absolute Basophils 0.1 0.0 - 0.4 10*9/L   Uric acid    Collection Time: 07/23/21  7:09 PM   Result Value Ref Range    Uric Acid 5.7 3.5 - 7.2 mg/dL   POCT Glucose    Collection Time: 07/24/21 12:23 AM   Result Value Ref Range    Glucose, POC 121 (H) 80 - 110 mg/dL   Lactate Sepsis    Collection Time: 07/24/21 12:26 AM   Result Value Ref Range    Lactate 0.7 0.5 - 2.0 mmol/L   PT-INR    Collection Time: 07/24/21 12:26 AM   Result Value Ref Range    PT 10.5 9.3 - 11.5 sec    INR 1.02 0.90 - 1.10   PTT    Collection Time: 07/24/21 12:26 AM   Result Value Ref Range    APTT 27.3 25.0 - 37.0 sec   ECG 12 Lead    Collection Time: 07/24/21  5:12 AM   Result Value Ref Range    EKG Systolic BP  mmHg    EKG Diastolic BP  mmHg    EKG Ventricular Rate 94 BPM    EKG Atrial Rate 94 BPM    EKG P-R Interval 164 ms    EKG QRS Duration 82 ms    EKG Q-T Interval 352 ms    EKG QTC Calculation 440 ms    EKG Calculated P Axis 52 degrees    EKG Calculated R Axis 78 degrees    EKG Calculated T Axis 45 degrees    QTC Fredericia 408 ms   Basic metabolic panel    Collection Time: 07/24/21  8:15 AM   Result Value Ref Range    Sodium 142 135 - 153 mmol/L    Potassium 4.2 3.5 - 5.3 mmol/L    Chloride 109 98 - 110 mmol/L    CO2 23.0 (L) 24.0 - 31.0 mmol/L    Anion Gap 10 5 - 16 mmol/L    BUN 24 (H) 8 - 21 mg/dL    Creatinine 1.61 0.96 - 1.40 mg/dL    BUN/Creatinine Ratio 34 (H) 6 - 20    eGFR CKD-EPI (2021) Male >90 >=60 mL/min/1.46m2    Glucose 116 (H) 75 - 110 mg/dL    Calcium 8.7 8.5 - 04.5 mg/dL   CBC    Collection Time: 07/24/21  8:15 AM   Result Value Ref Range    WBC 8.9 4.8 - 10.8 10*9/L    RBC 4.46 (L) 4.70 - 6.10 10*12/L    HGB 13.0 (L) 14.0 - 18.0 g/dL    HCT 40.9 (L) 81.1 - 52.0 %    MCV 92.8 80.0 - 104.0 fL    MCH 29.1 26.0 - 34.0 pg    MCHC 31.4 31.0 - 35.5 g/dL    RDW 91.4 78.2 - 95.6 %    MPV 13.8 (H) 7.0 - 10.0 fL    Platelet 154 130 - 400 10*9/L   Hemoglobin A1c (order if there has not been one within the previous 3 months)    Collection Time: 07/24/21  8:15 AM   Result Value Ref Range    Hemoglobin A1C 7.5 (H) 4.7 - 5.6 %    Estimated Average Glucose 169 mg/dL   POCT Glucose    Collection Time: 07/24/21  8:19 AM   Result Value Ref Range    Glucose, POC 126 (H) 80 - 110 mg/dL  POCT Glucose    Collection Time: 07/24/21 11:09 AM   Result Value Ref Range    Glucose, POC 97 80 - 110 mg/dL       Encounter Date: 16/10/96   ECG 12 Lead   Result Value    EKG Systolic BP     EKG Diastolic BP     EKG Ventricular Rate 94    EKG Atrial Rate 94    EKG P-R Interval 164    EKG QRS Duration 82    EKG Q-T Interval 352    EKG QTC Calculation 440    EKG Calculated P Axis 52    EKG Calculated R Axis 78    EKG Calculated T Axis 45    QTC Fredericia 408    Narrative    Normal sinus rhythm  Anteroseptal infarct (cited on or before 26-Mar-2019)  Abnormal ECG  When compared with ECG of 26-Mar-2019 17:53,  No significant change was found       Imaging    XR Foot 3 Or More Views Left   Final Result   1.  No acute abnormality or radiographic evidence of osteomyelitis in the left foot.   Electronically signed by:  Reed Pandy MD  07/24/2021 12:11 AM CDT Workstation: [id number]            SCHEDULED MEDS:   atorvastatin  40 mg Oral Daily    enoxaparin (LOVENOX) injection  40 mg Subcutaneous Q24H SCH    gabapentin  800 mg Oral TID    insulin glargine  52 Units Subcutaneous Daily    insulin lispro  10 Units Subcutaneous TID AC    lisinopriL  20 mg Oral Daily    pantoprazole  20 mg Oral Daily    vancomycin  1,500 mg Intravenous Q12H         ASSESSMENT/PLAN     Florian Chauca is a 61 y.o. male with a PMH of T2DM, HTN, psoriasis, polysubstance abuse admitted on 07/23/2021 with concern for left great toe cellulitis in the setting of poorly controlled diabetes.    #Left great toe cellulitis  - Toe is erythematous, warm, and swollen with no discharge. Erythema is localized to the toe. No pain noted by patient secondary to diabetic neuropathy.  - No history of gout. Uric acid wnl.   - Xray left foot showed no acute abnormality or evidence of osteomyelitis  - Lactate wnl  - BCx showing NGTD  - Continue on vancomycin  - Podiatry consulted, evaluated patient and indicated to continue IV abx, with reevaluation tomorrow    #Type II Diabetes Mellitus, on insulin  - Hgb A1c (07/24/2021) 7.5  - Holding home Jardiance 20 mg  - Continue home Levemir 52 units daily and Premeal NovoLog 10 units 3 times daily  - Hypoglycemia protocol    #Hypertension  - At home, on lisinopril 5 mg  - Increased Lisinopril to 20 mg     #Hyperlipidemia  -Continue atorvastatin 40 mg    Code Status:  Full Code  DVT Prophylaxis: pharmacological prophylaxis: lovenox 40 mg daily   GI Prophylaxis: Protonix  Diet: Nutrition Therapy Consistent Carb; Consistent Carb 45/45/45 (3/3/3); Sodium Restricted (2 gm Na+)  Consulting Services:  Podiatry    Disposition: Pending clinical improvement       Juanell Fairly, MD  Internal Medicine, PGY1  07/24/21    Attending Physician: Wyline Beady, MD

## 2021-07-24 NOTE — Unmapped (Signed)
Cory Bentley  61 y.o.  male    Chief Complaint:  Left foot pain    HPI:  Cory Bentley is a 61 y.o. male with a past medical history significant for hypertension, diabetes mellitus type 2, psoriasis and polysubstance abuse presented to the ED for evaluation of left foot pain X 1 week.  Patient reported he noticed pain, numbness redness and swelling to the left foot worsened on the left great toe.  Patient denies any injury, reported he has been working in the yard all day, denies any trauma.  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  Patient report he has lost feelings to his foot especially the left foot.  She complains of numbness and tingling to upper extremities, secondary to diabetic neuropathy and on gabapentin 800 mg 3 times daily. Patient denies any fever, chills, chest pain or shortness of breath.  Denies any abdominal pain, nausea or vomiting.    In the ED, blood pressure is 182/94, heart rate is 88, O2 sat is 98% on room air and temperature is 97.6.  On ER work-up, CBC showed hemoglobin of 13.2, hematocrit 41.7 otherwise unremarkable.  CMP showed BUN of 27, creatinine/BUN ratio is 25.  Glucose is 121.  PT/INR is negative.    X-ray of the right foot showed no acute abnormality or radiographic evidence of osteomyelitis in the left foot.      Home medication reviewed and reconciled.    I personally reviewed imaging and lab findings.  Past Medical History:   Diagnosis Date   ??? Diabetes mellitus (CMS-HCC)    ??? Hypertension    ??? Psoriasis    ??? Substance abuse (CMS-HCC)        Past Surgical History:   Procedure Laterality Date   ??? PR ANESTH,CLEFT LIP REPAIR Right 04/20/2018    Procedure: DEBRIDEMENT; SKIN, SUBCUTANEOUS TISSUE, & MUSCLE HAND and FINGER wounds;  Surgeon: Boris Sharper, MD;  Location: MAIN OR [facility];  Service: Plastics   ??? PR APPLY FOREARM SPLINT,STATIC Right 05/05/2018    Procedure: application right short arm ulnar gutter splint;  Surgeon: Boris Sharper, MD;  Location: MAIN OR [facility];  Service: Plastics   ??? PR APPLY FOREARM SPLINT,STATIC Right 05/11/2018    Procedure: Applic Short Arm Splint; Static;  Surgeon: Boris Sharper, MD;  Location: MAIN OR [facility];  Service: Plastics   ??? PR DEBRIDEMENT, SKIN, SUB-Q TISSUE,MUSCLE,=<20 SQ CM Right 04/27/2018    Procedure: debridement right palmar wound and dorsal right small finger wound;  Surgeon: Boris Sharper, MD;  Location: MAIN OR [facility];  Service: Plastics   ??? PR FULL THICK GRFT HEAD,FAC,HAND <20SQC Right 05/11/2018    Procedure: Full thickness skin graft to right palmar hand and right small finger wounds;  Surgeon: Boris Sharper, MD;  Location: MAIN OR [facility];  Service: Plastics   ??? PR NEGATIVE PRESSURE WOUND THERAPY DME </= 50 SQ CM Right 04/22/2018    Procedure: Right Hand Wound Vac Change;  Surgeon: Andreas Newport, MD;  Location: MAIN OR [facility];  Service: Plastics   ??? PR SUB GRFT F/S/N/H/F/G/M/D /<100SCM /<1ST 25 SCM Right 05/05/2018    Procedure: application allograft human skin to right hand and right small finger wounds;  Surgeon: Boris Sharper, MD;  Location: MAIN OR [facility];  Service: Plastics   ??? PR WOUND PREP, PED, TRK/ARM/LG 1ST 100 CM Right 05/05/2018    Procedure: Debridement right hand and  small finger wounds in preparation for skin grafting;  Surgeon: Boris Sharper, MD;  Location: MAIN OR [facility];  Service: Plastics   ??? PR WOUND PREP, PED, TRK/ARM/LG 1ST 100 CM Right 05/11/2018    Procedure: Surg Preparation/Creation Of Recipient Site By Excision Of Open Wound/Burn Eschar/Scar, Or Incisional Release Of Scar Contracture, Trunk/Arms/Legs; 1st 100 Sq Cm Or 1% Body Area Of Infants & Children;  Surgeon: Boris Sharper, MD;  Location: MAIN OR [facility];  Service: Plastics   ??? Right toe amputation         Social History     Socioeconomic History   ??? Marital status: Single   Tobacco Use   ??? Smoking status: Never   ??? Smokeless tobacco: Never   Vaping Use   ??? Vaping Use: Never used Substance and Sexual Activity   ??? Alcohol use: Yes     Comment: 2 beers/day average   ??? Drug use: Yes     Frequency: 7.0 times per week     Types: Marijuana     Comment: h/o cocaine abuse; daily marijuana   ??? Sexual activity: Not Currently   Other Topics Concern   ??? Do you use sunscreen? Yes   ??? Tanning bed use? No   ??? Are you easily burned? No   ??? Excessive sun exposure? Yes   ??? Blistering sunburns? No   Social History Narrative    Hx of cocaine abuse.  Denies tobacco.     Social Determinants of Health     Financial Resource Strain: Low Risk    ??? Difficulty of Paying Living Expenses: Not very hard   Food Insecurity: No Food Insecurity   ??? Worried About Running Out of Food in the Last Year: Never true   ??? Ran Out of Food in the Last Year: Never true   Transportation Needs: No Transportation Needs   ??? Lack of Transportation (Medical): No   ??? Lack of Transportation (Non-Medical): No   Physical Activity: Insufficiently Active   ??? Days of Exercise per Week: 3 days   ??? Minutes of Exercise per Session: 40 min   Stress: No Stress Concern Present   ??? Feeling of Stress : Only a little   Social Connections: Unknown   ??? Frequency of Communication with Friends and Family: More than three times a week   ??? Frequency of Social Gatherings with Friends and Family: More than three times a week   ??? Attends Religious Services: Patient refused   ??? Active Member of Clubs or Organizations: Patient refused   ??? Attends Banker Meetings: Patient refused   ??? Marital Status: Divorced         Family History   Problem Relation Age of Onset   ??? Diabetes Mother    ??? Diabetes type II Mother    ??? Ovarian cancer Mother    ??? Diabetes Father    ??? Diabetes type II Father    ??? Diabetes type II Sister    ??? Diabetes type II Brother    ??? Diabetes type II Maternal Grandmother    ??? Diabetes type II Maternal Grandfather    ??? Diabetes type II Paternal Grandmother    ??? Diabetes type II Paternal Grandfather    ??? Melanoma Neg Hx    ??? Basal cell carcinoma Neg Hx    ??? Squamous cell carcinoma Neg Hx        No Known Allergies      Review of Systems  Review of Systems   Constitutional: Positive for appetite change. Negative for chills, diaphoresis, fatigue and fever.   HENT: Negative.    Eyes: Negative.    Respiratory: Negative for cough, chest tightness, shortness of breath and wheezing.    Cardiovascular: Negative for chest pain, palpitations and leg swelling.   Gastrointestinal: Negative for abdominal pain, diarrhea, nausea and vomiting.   Endocrine: Negative for polydipsia, polyphagia and polyuria.   Genitourinary: Negative.  Negative for hematuria and urgency.   Musculoskeletal: Positive for arthralgias and joint swelling.   Skin: Positive for color change. Negative for wound.   Neurological: Negative for dizziness, seizures, weakness and headaches.   Psychiatric/Behavioral: Negative for agitation and behavioral problems.   All other systems reviewed and are negative.      Vital Signs:  Temp:  [36.8 ??C (98.2 ??F)] 36.8 ??C (98.2 ??F)  Heart Rate:  [80-92] 85  Resp:  [16-18] 16  BP: (151-182)/(78-95) 152/89  SpO2:  [97 %-99 %] 98 %  No intake/output data recorded.  I/O this shift:  In: 557.5 [IV Piggyback:557.5]  Out: -     Physical Exam  Vitals and nursing note reviewed.   Constitutional:       General: He is not in acute distress.     Appearance: He is obese. He is not ill-appearing, toxic-appearing or diaphoretic.   HENT:      Head: Normocephalic and atraumatic.      Mouth/Throat:      Mouth: Mucous membranes are dry.   Eyes:      Extraocular Movements: Extraocular movements intact.   Neck:      Vascular: No carotid bruit.   Cardiovascular:      Rate and Rhythm: Normal rate and regular rhythm.      Pulses: Normal pulses.      Heart sounds: No murmur heard.    No gallop.   Pulmonary:      Effort: No respiratory distress.      Breath sounds: Normal breath sounds. No stridor. No wheezing, rhonchi or rales.   Chest:      Chest wall: No tenderness. Abdominal:      General: Bowel sounds are normal.      Palpations: Abdomen is soft.      Tenderness: There is no abdominal tenderness.   Musculoskeletal:         General: Swelling and tenderness present. No deformity or signs of injury.      Cervical back: No rigidity or tenderness.      Right lower leg: No edema.      Left lower leg: No edema.      Comments: Left foot swelling, red. No pus drainage  Right 2nd toe amputation   Lymphadenopathy:      Cervical: No cervical adenopathy.   Skin:     General: Skin is warm and dry.      Capillary Refill: Capillary refill takes less than 2 seconds.      Findings: Erythema (left big toe) present.   Neurological:      General: No focal deficit present.      Mental Status: He is alert and oriented to person, place, and time.   Psychiatric:         Behavior: Behavior normal.           Recent Results (from the past 24 hour(s))   Comprehensive Metabolic Panel    Collection Time: 07/23/21  7:09 PM   Result Value Ref Range    Sodium  139 135 - 153 mmol/L    Potassium 4.3 3.5 - 5.3 mmol/L    Chloride 105 98 - 110 mmol/L    CO2 24.0 24.0 - 31.0 mmol/L    Anion Gap 10 5 - 16 mmol/L    BUN 27 (H) 8 - 21 mg/dL    Creatinine 6.29 5.28 - 1.40 mg/dL    BUN/Creatinine Ratio 25 (H) 6 - 20    eGFR CKD-EPI (2021) Male 78 >=60 mL/min/1.68m2    Glucose 79 75 - 110 mg/dL    Calcium 8.8 8.5 - 41.3 mg/dL    Albumin 3.8 3.1 - 4.7 g/dL    Total Protein 6.2 6.0 - 8.0 g/dL    Total Bilirubin 0.4 0.2 - 1.2 mg/dL    AST 23 10 - 40 U/L    ALT 17 3 - 36 U/L    Alkaline Phosphatase 74 33 - 120 U/L    Globulin, Total 2.4 g/dL    Osmolality Calculated 282 mOsm/kg    Albumin/Globulin Ratio 1.6    CBC w/ Differential    Collection Time: 07/23/21  7:09 PM   Result Value Ref Range    WBC 9.9 4.8 - 10.8 10*9/L    RBC 4.49 (L) 4.70 - 6.10 10*12/L    HGB 13.2 (L) 14.0 - 18.0 g/dL    HCT 24.4 (L) 01.0 - 52.0 %    MCV 92.9 80.0 - 104.0 fL    MCH 29.4 26.0 - 34.0 pg    MCHC 31.7 31.0 - 35.5 g/dL    RDW 27.2 53.6 - 64.4 %    MPV 13.9 (H) 7.0 - 10.0 fL    Platelet 168 130 - 400 10*9/L    Immature Platelet Fraction 24 (H) 1 - 7 %    Neutrophils % 53.4 %    Immature Granulocytes Relative Percent 0.3 %    Lymphocytes % 26.8 %    Monocytes % 9.8 %    Eosinophils % 9.2 %    Basophils % 0.5 %    Absolute Neutrophils 5.3 1.4 - 6.5 10*9/L    Immature Granulocytes Absolute Count 0.0 0.0 - 0.6 10*9/L    Absolute Lymphocytes 2.7 1.2 - 3.4 10*9/L    Absolute Monocytes 1.0 (H) 0.1 - 0.5 10*9/L    Absolute Eosinophils 0.9 (H) 0.0 - 0.7 10*9/L    Absolute Basophils 0.1 0.0 - 0.4 10*9/L   POCT Glucose    Collection Time: 07/24/21 12:23 AM   Result Value Ref Range    Glucose, POC 121 (H) 80 - 110 mg/dL   Lactate Sepsis    Collection Time: 07/24/21 12:26 AM   Result Value Ref Range    Lactate 0.7 0.5 - 2.0 mmol/L   PT-INR    Collection Time: 07/24/21 12:26 AM   Result Value Ref Range    PT 10.5 9.3 - 11.5 sec    INR 1.02 0.90 - 1.10   PTT    Collection Time: 07/24/21 12:26 AM   Result Value Ref Range    APTT 27.3 25.0 - 37.0 sec        Radiology:  Xray of the left foot: No fractures. No dislocations. No destructive bony changes. No soft tissue gas. No radiopaque foreign body.    Assessment and Plan  61 year old male with history of diabetes and hypertension presents for evaluation of left foot pain.  No osteomyelitis or evidence of severe infection.  Patient received vancomycin in the ED.  Patient requires inpatient admission for  further evaluation and treatment of diabetic foot infection.  Patient received vancomycin in the ED.  We will consult podiatry for further evaluation.  Admit to medical / telemetry ward as IP status:  #Diabetic Left Foot infection  Will continue vancomycin dose per pharmacy protocol  Will consult podiatry Dr. Katharina Caper for further evalaution  Will continue gabapentin 800 mg tid    #Diabetes Mellitus type II  Hemoglobin A1c on 03/02/2021 is 7.1%, will recheck A1c  We will continue Accu-Chek   We will continue Humalog 10 units 3 times daily  We will continue Levemir 52 units    #Hypertension  We will continue lisinopril 5 mg  We will continue to monitor vitals closely    #Hyperlipidemia  We will continue atorvastatin 40 mg    Consult: Dr. Katharina Caper Podiatry  GI Prophylaxis with Pepcid and Zofran  DVT Prophylaxis with sequential compression devices  Code Status:Full code  Diet:Consistent carb diet    Collaborating Physician: Dr. Renold Don  Electronically signed by  Pollyann Kennedy, PA    This care is provided during an unprecedented national emergency due to the Novel Coronavirus (COVID-19). COVID-19 infections and transmission risks place heavy strains on healthcare resources. As this pandemic evolves, the Hospital and providers strive to respond fluidly, to remain operational, and to provide care relative to available resources and information. Outcomes are unpredictable and treatments are without well-defined guidelines. Further, the impact of COVID-19 on all aspects of emergency care, including the impact to patients seeking care for reasons other than COVID-19, is unavoidable during this national emergency.

## 2021-07-24 NOTE — Unmapped (Signed)
EMERGENCY MEDICINE    Chief Complaint   Patient presents with   ??? Toe Pain       HISTORY OF PRESENT ILLNESS:  Lenorris Karger is a 61 y.o. male who presents   HPI  Patient comes in for evaluation of redness and swelling to his left big toe.  Patient states he has been working in the yard all day at his mom's house getting the yard cleanup.  He states he has diabetes, last A1c was 7.1.  He has diabetic neuropathy, he cannot feel his feet.  He does not notice any pain with this.  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  He also has a history of infection in both hands that required surgery that with loss of function to different digits in his hand, right pinky finger and left thumb.  Patient states he is also had strokes.  He has had diabetes for 20 years.  He smokes marijuana, denies cigarettes, alcohol, cocaine.  Patient denies history of cancer.  Patient states he has never come in with a red or swollen infected digit and went home with antibiotics.  Patient high risk for diabetic infection, high risk for losing digit or foot.    REVIEW OF SYSTEMS:  Review of Systems   Constitutional: Negative for fever.   HENT: Negative for congestion.    Respiratory: Negative for shortness of breath.    Cardiovascular: Negative for chest pain.   Gastrointestinal: Negative for abdominal pain.   Genitourinary: Negative for flank pain.   Skin:        Right big toe swollen and red.   Neurological: Negative for syncope.   Hematological: Does not bruise/bleed easily.   Psychiatric/Behavioral: Negative for confusion.         PAST HISTORY:   Past Medical History:   Past Medical History:   Diagnosis Date   ??? Diabetes mellitus (CMS-HCC)    ??? Hypertension    ??? Psoriasis    ??? Substance abuse (CMS-HCC)      Past Surgical History:   Past Surgical History:   Procedure Laterality Date   ??? PR ANESTH,CLEFT LIP REPAIR Right 04/20/2018    Procedure: DEBRIDEMENT; SKIN, SUBCUTANEOUS TISSUE, & MUSCLE HAND and FINGER wounds;  Surgeon: Boris Sharper, MD;  Location: MAIN OR [facility];  Service: Plastics   ??? PR APPLY FOREARM SPLINT,STATIC Right 05/05/2018    Procedure: application right short arm ulnar gutter splint;  Surgeon: Boris Sharper, MD;  Location: MAIN OR [facility];  Service: Plastics   ??? PR APPLY FOREARM SPLINT,STATIC Right 05/11/2018    Procedure: Applic Short Arm Splint; Static;  Surgeon: Boris Sharper, MD;  Location: MAIN OR [facility];  Service: Plastics   ??? PR DEBRIDEMENT, SKIN, SUB-Q TISSUE,MUSCLE,=<20 SQ CM Right 04/27/2018    Procedure: debridement right palmar wound and dorsal right small finger wound;  Surgeon: Boris Sharper, MD;  Location: MAIN OR [facility];  Service: Plastics   ??? PR FULL THICK GRFT HEAD,FAC,HAND <20SQC Right 05/11/2018    Procedure: Full thickness skin graft to right palmar hand and right small finger wounds;  Surgeon: Boris Sharper, MD;  Location: MAIN OR [facility];  Service: Plastics   ??? PR NEGATIVE PRESSURE WOUND THERAPY DME </= 50 SQ CM Right 04/22/2018    Procedure: Right Hand Wound Vac Change;  Surgeon: Andreas Newport, MD;  Location: MAIN OR [facility];  Service: Plastics   ??? PR SUB GRFT F/S/N/H/F/G/M/D /<100SCM /<1ST 25  SCM Right 05/05/2018    Procedure: application allograft human skin to right hand and right small finger wounds;  Surgeon: Boris Sharper, MD;  Location: MAIN OR [facility];  Service: Plastics   ??? PR WOUND PREP, PED, TRK/ARM/LG 1ST 100 CM Right 05/05/2018    Procedure: Debridement right hand and small finger wounds in preparation for skin grafting;  Surgeon: Boris Sharper, MD;  Location: MAIN OR [facility];  Service: Plastics   ??? PR WOUND PREP, PED, TRK/ARM/LG 1ST 100 CM Right 05/11/2018    Procedure: Surg Preparation/Creation Of Recipient Site By Excision Of Open Wound/Burn Eschar/Scar, Or Incisional Release Of Scar Contracture, Trunk/Arms/Legs; 1st 100 Sq Cm Or 1% Body Area Of Infants & Children;  Surgeon: Boris Sharper, MD;  Location: MAIN OR [facility];  Service: Plastics   ??? Right toe amputation Past Family History:   Family History   Problem Relation Age of Onset   ??? Diabetes Mother    ??? Diabetes type II Mother    ??? Ovarian cancer Mother    ??? Diabetes Father    ??? Diabetes type II Father    ??? Diabetes type II Sister    ??? Diabetes type II Brother    ??? Diabetes type II Maternal Grandmother    ??? Diabetes type II Maternal Grandfather    ??? Diabetes type II Paternal Grandmother    ??? Diabetes type II Paternal Grandfather    ??? Melanoma Neg Hx    ??? Basal cell carcinoma Neg Hx    ??? Squamous cell carcinoma Neg Hx      Past Social History:   Social History     Tobacco Use   ??? Smoking status: Never   ??? Smokeless tobacco: Never   Substance Use Topics   ??? Alcohol use: Yes     Comment: 2 beers/day average     Medications:   Current Discharge Medication List      CONTINUE these medications which have NOT CHANGED    Details   lisinopriL (PRINIVIL,ZESTRIL) 5 MG tablet Take 1 tablet (5 mg total) by mouth daily.  Qty: 90 tablet, Refills: 1    Associated Diagnoses: Primary hypertension      alcohol swabs (ALCOHOL PADS) PadM Apply 1 swab topically four (4) times a day.  Qty: 400 each, Refills: 1      atorvastatin (LIPITOR) 40 MG tablet Take 1 tablet (40 mg total) by mouth daily.  Qty: 90 tablet, Refills: 1    Associated Diagnoses: Hyperlipidemia associated with type 2 diabetes mellitus (CMS-HCC)      BD ULTRA-FINE MINI PEN NEEDLE 31 gauge x 3/16 (5 mm) Ndle Inject 4 times daily  Qty: 100 each, Refills: 0      betamethasone valerate (VALISONE) 0.1 % ointment Apply to thick areas of psoriasis twice daily as needed until smooth. Avoid face and skin folds.  Qty: 45 g, Refills: 8    Associated Diagnoses: Psoriasis      blood sugar diagnostic Strp Accu Check Aviva Strip, use 4 times per day for monitoring insulin dependant uncontrolled diabetes      blood-glucose meter kit Use as instructed  Qty: 1 each, Refills: 0    Associated Diagnoses: Type 2 diabetes mellitus with other skin complication, with long-term current use of insulin (CMS-HCC)      diaper,brief,adult,disposable Misc 1 each by Miscellaneous route two (2) times a day.  Qty: 20 each, Refills: 2      empagliflozin (JARDIANCE) 10 mg tablet Take 2 tablets (  20 mg total) by mouth daily.  Qty: 180 tablet, Refills: 1    Associated Diagnoses: Type 2 diabetes mellitus with other skin complication, with long-term current use of insulin (CMS-HCC)      empty container Misc Use as directed to dispose of Stelara syringes.  Qty: 1 each, Refills: 2      flash glucose sensor (FREESTYLE LIBRE 14 DAY SENSOR) kit Apply new sensor every 14 days and use as directed to monitor and check blood sugars  Qty: 2 each, Refills: 12    Associated Diagnoses: Type 2 diabetes mellitus with other skin complication, with long-term current use of insulin (CMS-HCC)      gabapentin (NEURONTIN) 800 MG tablet Take 1 tablet (800 mg total) by mouth Three (3) times a day.  Qty: 270 tablet, Refills: 1    Associated Diagnoses: Diabetic polyneuropathy associated with type 2 diabetes mellitus (CMS-HCC)      hydrocortisone 2.5 % ointment Apply to psoriasis in ears up to twice daily as needed until smooth.  Qty: 30 g, Refills: 4    Associated Diagnoses: Psoriasis      insulin detemir U-100 (LEVEMIR) 100 unit/mL (3 mL) injection pen Inject 0.52 mL (52 Units total) under the skin daily.  Qty: 15 mL, Refills: 1    Associated Diagnoses: Type 2 diabetes mellitus with other skin complication, with long-term current use of insulin (CMS-HCC)      NOVOLOG FLEXPEN U-100 INSULIN 100 unit/mL (3 mL) injection pen Inject 0.1 mL (10 Units total) under the skin Three (3) times a day before meals.  Qty: 27 mL, Refills: 3    Comments: Please dispense 3 pens per month 9 mls      omeprazole (PRILOSEC) 20 MG capsule Take 1 capsule (20 mg total) by mouth daily.  Qty: 90 capsule, Refills: 3    Associated Diagnoses: Gastroesophageal reflux disease, unspecified whether esophagitis present      testosterone 20.25 mg/1.25 gram (1.62 %) gel pump Place 2 sprays (40.5 mg total) on the skin daily.  Qty: 75 g, Refills: 0      ustekinumab (STELARA) 45 mg/0.5 mL Syrg syringe Inject the contents of 1 syringe (45 mg) under the skin every 12 weeks  Qty: 2 mL, Refills: 3    Comments: Maintenance dose  Associated Diagnoses: Psoriasis; High risk medication use           Allergies: No Known Allergies      PHYSICAL EXAM:  ED Triage Vitals [07/23/21 1849]   Enc Vitals Group      BP 151/78      Heart Rate 83      SpO2 Pulse       Resp 16      Temp 36.8 ??C (98.2 ??F)      Temp Source Oral      SpO2 97 %      Weight       Height       Head Circumference       Peak Flow       Pain Score       Pain Loc       Pain Edu?       Excl. in GC?      Vitals:    07/24/21 1106 07/24/21 1509 07/24/21 1723 07/24/21 1900   BP: 139/76 173/85 159/76 150/70   Pulse: 92 89 66 85   Resp: 18 18  18    Temp: 36.8 ??C (98.2 ??F) 36.6 ??C (97.8 ??F)  36.8 ??  C (98.2 ??F)   TempSrc: Oral Oral  Oral   SpO2: 98% 98%  100%   Weight:       Height:          Physical Exam  Vitals and nursing note reviewed. Exam conducted with a chaperone present.   Constitutional:       General: He is not in acute distress.     Appearance: Normal appearance. He is normal weight. He is not ill-appearing, toxic-appearing or diaphoretic.      Comments: Patient resting comfortably, no acute distress.  No respiratory distress.  No painful distress.  Answers questions appropriately without difficulty.  Ranges head and neck spontaneously on his own without difficulty.   HENT:      Head: Normocephalic and atraumatic.      Right Ear: External ear normal.      Left Ear: External ear normal.      Nose: Nose normal.      Mouth/Throat:      Mouth: Mucous membranes are moist.      Pharynx: Oropharynx is clear.   Eyes:      Extraocular Movements: Extraocular movements intact.      Conjunctiva/sclera: Conjunctivae normal.      Pupils: Pupils are equal, round, and reactive to light.   Cardiovascular:      Rate and Rhythm: Normal rate and regular rhythm.      Pulses: Normal pulses.      Heart sounds: Normal heart sounds.   Pulmonary:      Effort: Pulmonary effort is normal. No respiratory distress.      Breath sounds: Normal breath sounds.   Abdominal:      Palpations: Abdomen is soft.      Tenderness: There is no abdominal tenderness.   Musculoskeletal:         General: Normal range of motion.      Cervical back: Normal range of motion. No rigidity or tenderness.      Comments: Patient all 5 toes on his left foot with redness, blanching and swelling.  He has edema up to his mid tib-fib area.  Concern for acute infection.  Right foot with missing second toe, well-healed surgical site.  Chronic deformity of left thumb and right pinky finger.  Rest of hands and feet without signs of acute infection.  Patient otherwise well-appearing.   Skin:     General: Skin is warm and dry.      Capillary Refill: Capillary refill takes less than 2 seconds.   Neurological:      General: No focal deficit present.      Mental Status: He is alert and oriented to person, place, and time.   Psychiatric:         Mood and Affect: Mood normal.         Behavior: Behavior normal.         Thought Content: Thought content normal.           DIAGNOSTICS:     Imaging:  XR Foot 3 Or More Views Left   Final Result   1.  No acute abnormality or radiographic evidence of osteomyelitis in the left foot.   Electronically signed by:  Reed Pandy MD  07/24/2021 12:11 AM CDT Workstation: [id number]        Labs:  Labs Reviewed   COMPREHENSIVE METABOLIC PANEL - Abnormal; Notable for the following components:       Result Value    BUN 27 (*)  BUN/Creatinine Ratio 25 (*)     All other components within normal limits   BASIC METABOLIC PANEL - Abnormal; Notable for the following components:    CO2 23.0 (*)     BUN 24 (*)     BUN/Creatinine Ratio 34 (*)     Glucose 116 (*)     All other components within normal limits   CBC - Abnormal; Notable for the following components:    RBC 4.46 (*)     HGB 13.0 (*)     HCT 41.4 (*)     MPV 13.8 (*)     All other components within normal limits   HEMOGLOBIN A1C - Abnormal; Notable for the following components:    Hemoglobin A1C 7.5 (*)     All other components within normal limits    Narrative:     Patient Reference Ranges:  <5.7%         Decreased risk of diabetes  5.7 - 6.4%    Increased risk of diabetes (Prediabetic)  > or = 6.5%   Consistent with diabetes   POCT GLUCOSE, INTERFACED - Abnormal; Notable for the following components:    Glucose, POC 121 (*)     All other components within normal limits   POCT GLUCOSE, INTERFACED - Abnormal; Notable for the following components:    Glucose, POC 126 (*)     All other components within normal limits   POCT GLUCOSE, INTERFACED - Abnormal; Notable for the following components:    Glucose, POC 69 (*)     All other components within normal limits   POCT GLUCOSE, INTERFACED - Abnormal; Notable for the following components:    Glucose, POC 161 (*)     All other components within normal limits   CBC W/ AUTO DIFF - Abnormal; Notable for the following components:    RBC 4.49 (*)     HGB 13.2 (*)     HCT 41.7 (*)     MPV 13.9 (*)     Immature Platelet Fraction 24 (*)     Absolute Monocytes 1.0 (*)     Absolute Eosinophils 0.9 (*)     All other components within normal limits   LACTATE SEPSIS - Normal   PROTIME-INR - Normal   APTT - Normal   URIC ACID - Normal   POCT GLUCOSE, INTERFACED - Normal   BLOOD CULTURE   BLOOD CULTURE   CBC W/ DIFFERENTIAL    Narrative:     The following orders were created for panel order CBC w/ Differential.  Procedure                               Abnormality         Status                     ---------                               -----------         ------                     CBC w/ [id number]]         Abnormal            Final result  Morphology Review[[id number]]                                                            Please view results for these tests on the individual orders.         ED COURSE AND TREATMENT:   Patient???s condition remained stable during Emergency Department evaluation.     Previous medical records requested.     Orders written.    Diagnostics reviewed.     Medications   acetaminophen (TYLENOL) tablet 650 mg (has no administration in time range)   aluminum-magnesium hydroxide-simethicone (MAALOX MAX) 80-80-8 mg/mL oral suspension (has no administration in time range)   melatonin tablet 3 mg (has no administration in time range)   magnesium hydroxide (MILK OF MAGNESIA) oral suspension (has no administration in time range)   polyethylene glycol (MIRALAX) packet 17 g (has no administration in time range)   ondansetron (ZOFRAN-ODT) disintegrating tablet 8 mg (has no administration in time range)     Or   ondansetron (ZOFRAN) injection 4 mg (has no administration in time range)   vancomycin 1,500 mg in sodium chloride (NS) 0.9 % 500 mL IVPB-connector bag (0 mg Intravenous Stopped 07/24/21 1411)   atorvastatin (LIPITOR) tablet 40 mg (40 mg Oral Given 07/24/21 0906)   gabapentin (NEURONTIN) capsule 800 mg (800 mg Oral Given 07/24/21 2029)   pantoprazole (PROTONIX) EC tablet 20 mg (20 mg Oral Given 07/24/21 0906)   insulin lispro (HumaLOG) injection 10 Units (10 Units Subcutaneous Not Given 07/24/21 1657)   insulin glargine (BASAGLAR, LANTUS) injection pen 52 Units (52 Units Subcutaneous Given 07/24/21 0907)   dextrose (GLUTOSE) 40 % gel 15 g of dextrose (has no administration in time range)   dextrose (D10W) 10% bolus 125 mL (has no administration in time range)   glucagon injection 1 mg (has no administration in time range)   lisinopriL (PRINIVIL,ZESTRIL) tablet 20 mg (20 mg Oral Given 07/24/21 0906)   hydrALAZINE (APRESOLINE) injection 10 mg (has no administration in time range)   enoxaparin (LOVENOX) syringe 40 mg (40 mg Subcutaneous Given 07/24/21 0906)   vancomycin (VANCOCIN) 1,750 mg in sodium chloride (NS) 0.9 % 500 mL IVPB (0 mg Intravenous Stopped 07/24/21 0226)   hydrALAZINE (APRESOLINE) injection 10 mg (10 mg Intravenous Given 07/24/21 0454)       BP 150/70  - Pulse 85  - Temp 36.8 ??C (98.2 ??F) (Oral)  - Resp 18  - Ht 172.7 cm (5' 8)  - Wt 92.3 kg (203 lb 7 oz)  - SpO2 100%  - BMI 30.93 kg/m??          CLINICAL IMPRESSION:  Final diagnoses:   Diabetic foot infection (CMS-HCC) (Primary)        CONDITION ON DISCHARGE:   Serious    PLAN AND FOLLOW-UP:   Diabetic foot infection.  Admitted to medicine team.        This documentation accurately reflects the service I, Gram Siedlecki L. Madelyn Flavors, MD, performed and the decisions I made.       PRESCRIPTIONS:   Current Discharge Medication List            MEDICAL DECISION MAKING::  MDM patient with diabetic foot infection.  Will admit for IV antibiotics and close care.  Patient with multiple digits that have  been infected in the past with poor outcomes.  High risk for poor outcome of this current infection due to his uncontrolled diabetes     Ian Bushman, MD  07/24/21 2035

## 2021-07-24 NOTE — Unmapped (Signed)
Podiatric Medicine Inpatient Consult Note    Reason for Consult: Left foot infection      History of Present Illness:    Cory Bentley is an 61 y.o. male. with a past medical history significant for hypertension, diabetes mellitus type 2, psoriasis and polysubstance abuse presented to the ED for evaluation of left foot pain X 1 week.  Patient reported he noticed pain, numbness redness and swelling to the left foot worsened on the left great toe.  Patient denies any injury, reported he has been working in the yard all day, denies any trauma.  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  Patient report he has lost feelings to his foot especially the left foot. He notes that he had been working in the yard at his moms house Sunday and Monday when this started. He is visiting here from [place].       Allergies  No Known Allergies     Medications   Current Facility-Administered Medications   Medication Dose Route Frequency Provider Last Rate Last Admin   ??? acetaminophen (TYLENOL) tablet 650 mg  650 mg Oral Q4H PRN Abosede Dalbert [place], PA       ??? aluminum-magnesium hydroxide-simethicone (MAALOX MAX) 80-80-8 mg/mL oral suspension  30 mL Oral Q4H PRN Abosede Dalbert [place], PA       ??? atorvastatin (LIPITOR) tablet 40 mg  40 mg Oral Daily Abosede Dalbert [place], [place]       ??? dextrose (D10W) 10% bolus 125 mL  12.5 g Intravenous Q10 Min PRN Abosede Dalbert [place], [place]       ??? dextrose (GLUTOSE) 40 % gel 15 g of dextrose  15 g of dextrose Oral Q10 Min PRN Abosede Dalbert [place], [place]       ??? gabapentin (NEURONTIN) capsule 800 mg  800 mg Oral TID [place], PA       ??? glucagon injection 1 mg  1 mg Intramuscular Once PRN Abosede Dalbert [place], PA       ??? hydrALAZINE (APRESOLINE) injection 10 mg  10 mg Intravenous Q6H PRN Prathyusha Gudapati, DO       ??? insulin glargine (BASAGLAR, LANTUS) injection pen 52 Units  52 Units Subcutaneous Daily Abosede Dalbert [place], [place]       ??? insulin lispro (HumaLOG) injection 10 Units  10 Units Subcutaneous TID AC Abosede Dalbert [place], [place]       ??? lisinopriL (PRINIVIL,ZESTRIL) tablet 20 mg  20 mg Oral Daily Anmar Al-Sultani, MD       ??? magnesium hydroxide (MILK OF MAGNESIA) oral suspension  30 mL Oral Q6H PRN Abosede Dalbert [place], PA       ??? melatonin tablet 3 mg  3 mg Oral Nightly PRN Abosede Dalbert [place], PA       ??? ondansetron (ZOFRAN-ODT) disintegrating tablet 8 mg  8 mg Oral Q8H PRN Abosede Dalbert [place], PA        Or   ??? ondansetron (ZOFRAN) injection 4 mg  4 mg Intravenous Q8H PRN Abosede Dalbert [place], PA       ??? pantoprazole (PROTONIX) EC tablet 20 mg  20 mg Oral Daily Abosede Dalbert [place], [place]       ??? polyethylene glycol (MIRALAX) packet 17 g  17 g Oral Daily PRN Abosede Dalbert [place], PA       ??? vancomycin 1,500 mg in sodium chloride (NS) 0.9 % 500 mL IVPB-connector bag  1,500 mg Intravenous Q12H Wyline Beady, MD           Medical History  Past Medical History:   Diagnosis Date   ??? Diabetes mellitus (CMS-HCC)    ??? Hypertension    ??? Psoriasis    ??? Substance abuse (CMS-HCC)        Surgical History  Past Surgical History:   Procedure Laterality Date   ??? PR ANESTH,CLEFT LIP REPAIR Right 04/20/2018    Procedure: DEBRIDEMENT; SKIN, SUBCUTANEOUS TISSUE, & MUSCLE HAND and FINGER wounds;  Surgeon: Boris Sharper, MD;  Location[facility]IN [facility];  Service: Plastics   ??? PR APPLY FOREARM SPLINT,STATIC Right 05/05/2018    Procedure: application right short arm ulnar gutter splint;  Surgeon: Boris Sharper, MD;  Location: MAIN OR [facility];  Service: Plastics   ??? PR APPLY FOREARM SPLINT,STATIC Right 05/11/2018    Procedure: Applic Short Arm Splint; Static;  Surgeon: Boris Sharper, MD;  Location: MAIN OR [facility];  Service: Plastics   ??? PR DEBRIDEMENT, SKIN, SUB-Q TISSUE,MUSCLE,=<20 SQ CM Right 04/27/2018    Procedure: debridement right palmar wound and dorsal right small finger wound;  Surgeon: Boris Sharper, MD;  Location: MAIN OR [facility];  Service: Plastics   ??? PR FULL THICK GRFT HEAD,FAC,HAND <20SQC Right 05/11/2018    Procedure: Full thickness skin graft to right palmar hand and right small finger wounds;  Surgeon: Boris Sharper, MD;  Location: MAIN OR [facility];  Service: Plastics   ??? PR NEGATIVE PRESSURE WOUND THERAPY DME </= 50 SQ CM Right 04/22/2018    Procedure: Right Hand Wound Vac Change;  Surgeon: Andreas Newport, MD;  Location: MAIN OR [facility];  Service: Plastics   ??? PR SUB GRFT F/S/N/H/F/G/M/D /<100SCM /<1ST 25 SCM Right 05/05/2018    Procedure: application allograft human skin to right hand and right small finger wounds;  Surgeon: Boris Sharper, MD;  Location: MAIN OR [facility];  Service: Plastics   ??? PR WOUND PREP, PED, TRK/ARM/LG 1ST 100 CM Right 05/05/2018    Procedure: Debridement right hand and small finger wounds in preparation for skin grafting;  Surgeon: Boris Sharper, MD;  Location: MAIN OR [facility];  Service: Plastics   ??? PR WOUND PREP, PED, TRK/ARM/LG 1ST 100 CM Right 05/11/2018    Procedure: Surg Preparation/Creation Of Recipient Site By Excision Of Open Wound/Burn Eschar/Scar, Or Incisional Release Of Scar Contracture, Trunk/Arms/Legs; 1st 100 Sq Cm Or 1% Body Area Of Infants & Children;  Surgeon: Boris Sharper, MD;  Location: MAIN OR [facility];  Service: Plastics   ??? Right toe amputation         Family History  The patient's family history includes Diabetes in his father and mother; Diabetes type II in his brother, father, maternal grandfather, maternal grandmother, mother, paternal grandfather, paternal grandmother, and sister; Ovarian cancer in his mother..    Review of Systems  A 12 point review of systems was negative except for pertinent items noted in the HPI.    Objective:   Patient Vitals for the past 8 hrs:   BP Temp Temp src Pulse Resp SpO2 Height Weight   07/24/21 0716 176/78 36.6 ??C (97.8 ??F) Oral 97 18 97 % -- --   07/24/21 0531 -- -- -- -- -- -- -- 92.3 kg (203 lb 7 oz)   07/24/21 0426 185/94 36.4 ??C (97.6 ??F) Oral 95 18 98 % 172.7 cm (5' 8) 92.3 kg (203 lb 7 oz)   07/24/21 0358 152/89 -- -- 85  16 98 % -- --   07/24/21 0314 175/95 -- -- 80 18 99 % -- --   07/24/21 0209 151/78 -- -- 87 18 98 % -- --   07/24/21 0026 168/84 -- -- 92 18 98 % -- --     No intake/output data recorded.    Physical Exam    Vascular: DP and PT pulses are palpable 2/4 bilaterally, hair growth present. Skin temp and turor WNL.     Dermatologic: Blanchable erythema to the distal right hallux, does not extend proximal to the hallux IPJ. Starts at the proximal nail fold where there is some dry serous drainage. NO signs of abscess, ingrown nail, or deeper infection.     Orthopedic: right 2nd digit amputation    Neurologic: sensation diminished       Test Results  Data Review   CBC:   Lab Results   Component Value Date    WBC 9.9 07/23/2021    WBC 6.9 04/13/2020    RBC 4.49 (L) 07/23/2021    RBC 4.73 04/13/2020    HGB 13.2 (L) 07/23/2021    HGB 13.7 (L) 04/13/2020    HCT 41.7 (L) 07/23/2021    HCT 42.6 04/13/2020    MCV 92.9 07/23/2021    MCV 90 04/13/2020    MCH 29.4 07/23/2021    MCH 29.0 04/13/2020    MCHC 31.7 07/23/2021    MCHC 32.2 04/13/2020    RDW 13.7 07/23/2021    RDW 13.2 04/10/2018    Platelet 168 07/23/2021    Platelet 170 04/13/2020    MPV 13.9 (H) 07/23/2021    MPV 13.6 (H) 01/27/2020       Imaging  ECG 12 Lead  Normal sinus rhythm  Anteroseptal infarct (cited on or before 26-Mar-2019)  Abnormal ECG  When compared with ECG of 26-Mar-2019 17:53,  No significant change was found  XR Foot 3 Or More Views Left  Narrative: EXAM DESCRIPTION:  XR FOOT 3 OR MORE VIEWS LEFT  CLINICAL HISTORY:  61 years Male, diabetic foot infection of all toes  COMPARISON: Contralateral right foot radiographs June 12, 2014.  FINDINGS:  No fractures. No dislocations. No destructive bony changes. No soft tissue gas. No radiopaque foreign body.  Impression: 1.  No acute abnormality or radiographic evidence of osteomyelitis in the left foot.  Electronically signed by:  Reed Pandy MD  07/24/2021 12:11 AM CDT Workstation: [id number]          Assessment/Recommendations:  Active Problems:    * No active hospital problems. *      Plan:    1. Cellulitis    Patient was seen and evaluated at bedside. He has cellulitis localized to the left hallux. Continue empiric IV antibiotics. Will reassess tomorrow around lunch time, if this has improved he may be ok for discharge tomorrow.       Rexene Alberts Sahli-Carter, DPM  07/24/2021  8:05 AM

## 2021-07-25 LAB — CBC W/ AUTO DIFF
BASOPHILS ABSOLUTE COUNT: 0.1 10*9/L (ref 0.0–0.4)
BASOPHILS RELATIVE PERCENT: 0.7 %
EOSINOPHILS ABSOLUTE COUNT: 0.8 10*9/L — ABNORMAL HIGH (ref 0.0–0.7)
EOSINOPHILS RELATIVE PERCENT: 8.9 %
HEMATOCRIT: 40.8 % — ABNORMAL LOW
HEMOGLOBIN: 12.8 g/dL — ABNORMAL LOW
IMMATURE GRANULOCYTES ABSOLUTE COUNT: 0 10*9/L (ref 0.0–0.6)
IMMATURE GRANULOCYTES RELATIVE PERCENT: 0.2 %
IMMATURE PLATELET FRACTION: 21 % — ABNORMAL HIGH (ref 1–7)
LYMPHOCYTES ABSOLUTE COUNT: 2.3 10*9/L (ref 1.2–3.4)
LYMPHOCYTES RELATIVE PERCENT: 27 %
MEAN CORPUSCULAR HEMOGLOBIN CONC: 31.4 g/dL (ref 31.0–35.5)
MEAN CORPUSCULAR HEMOGLOBIN: 29.2 pg (ref 26.0–34.0)
MEAN CORPUSCULAR VOLUME: 93.2 fL
MEAN PLATELET VOLUME: 13.7 fL — ABNORMAL HIGH (ref 7.0–10.0)
MONOCYTES ABSOLUTE COUNT: 1 10*9/L — ABNORMAL HIGH (ref 0.1–0.5)
MONOCYTES RELATIVE PERCENT: 11.6 %
NEUTROPHILS ABSOLUTE COUNT: 4.5 10*9/L (ref 1.4–6.5)
NEUTROPHILS RELATIVE PERCENT: 51.6 %
PLATELET COUNT: 167 10*9/L (ref 130–400)
RED BLOOD CELL COUNT: 4.38 10*12/L — ABNORMAL LOW
RED CELL DISTRIBUTION WIDTH: 13.4 % (ref 11.5–15.5)
WBC ADJUSTED: 8.6 10*9/L (ref 4.8–10.8)

## 2021-07-25 LAB — BASIC METABOLIC PANEL
ANION GAP: 7 mmol/L (ref 5–16)
BLOOD UREA NITROGEN: 26 mg/dL — ABNORMAL HIGH (ref 8–21)
BUN / CREAT RATIO: 29 — ABNORMAL HIGH (ref 6–20)
CALCIUM: 8.8 mg/dL (ref 8.5–10.4)
CHLORIDE: 108 mmol/L (ref 98–110)
CO2: 25 mmol/L (ref 24.0–31.0)
CREATININE: 0.9 mg/dL (ref 0.50–1.40)
EGFR CKD-EPI (2021) MALE: >90 mL/min/{1.73_m2} (ref >=60)
GLUCOSE RANDOM: 122 mg/dL — ABNORMAL HIGH (ref 75–110)
POTASSIUM: 4.1 mmol/L (ref 3.5–5.3)
SODIUM: 140 mmol/L (ref 135–153)

## 2021-07-25 MED ADMIN — atorvastatin (LIPITOR) tablet 40 mg: 40 mg | ORAL | @ 12:00:00 | Stop: 2021-07-25

## 2021-07-25 MED ADMIN — pantoprazole (PROTONIX) EC tablet 20 mg: 20 mg | ORAL | @ 12:00:00 | Stop: 2021-07-25

## 2021-07-25 MED ADMIN — vancomycin 1,500 mg in sodium chloride (NS) 0.9 % 500 mL IVPB-connector bag: 1500 mg | INTRAVENOUS | @ 05:00:00 | Stop: 2021-07-25

## 2021-07-25 MED ADMIN — hydrALAZINE (APRESOLINE) injection 10 mg: 10 mg | INTRAVENOUS | @ 12:00:00 | Stop: 2021-07-25

## 2021-07-25 MED ADMIN — gabapentin (NEURONTIN) capsule 800 mg: 800 mg | ORAL | @ 12:00:00 | Stop: 2021-07-25

## 2021-07-25 MED ADMIN — lisinopriL (PRINIVIL,ZESTRIL) tablet 20 mg: 20 mg | ORAL | @ 12:00:00 | Stop: 2021-07-25

## 2021-07-25 MED ADMIN — lisinopriL (PRINIVIL,ZESTRIL) tablet 10 mg: 10 mg | ORAL | @ 14:00:00 | Stop: 2021-07-25

## 2021-07-25 MED ADMIN — gabapentin (NEURONTIN) capsule 800 mg: 800 mg | ORAL

## 2021-07-25 NOTE — Unmapped (Signed)
Podiatric Medicine Daily Progress Note      Subjective:    Patient is visited at bedside for continued follow-up of the left hallux erythema and concerns for infection.  Patient admits that this started approximately 3 days ago the left hallux had a little touch of redness however he worked in the yard on Monday, 2 days ago which really aggravated the left hallux nail.  He mitts when he was working in the yard he was doing a lot of shrubbery work using a shovel and for several hours.  Patient mitts he has no pain to the areas but was concerned about the redness.    Objective:    Vital signs in last 24 hours:  Temp:  [36.6 ??C (97.8 ??F)-36.8 ??C (98.2 ??F)] 36.6 ??C (97.9 ??F)  Heart Rate:  [66-92] 74  Resp:  [16-18] 18  BP: (129-173)/(70-85) 152/85  MAP (mmHg):  [93-114] 107  SpO2:  [97 %-100 %] 99 %    Intake/Output last 3 shifts:  I/O last 3 completed shifts:  In: 1165.5 [P.O.:608; IV Piggyback:557.5]  Out: -   Intake/Output this shift:  No intake/output data recorded.    Labs:    Lab Results   Component Value Date    WBC 8.6 07/25/2021    HGB 12.8 (L) 07/25/2021    HCT 40.8 (L) 07/25/2021    PLT 167 07/25/2021       Lab Results   Component Value Date    NA 142 07/24/2021    K 4.2 07/24/2021    CL 109 07/24/2021    CO2 23.0 (L) 07/24/2021    BUN 24 (H) 07/24/2021    CREATININE 0.70 07/24/2021    GLU 116 (H) 07/24/2021    CALCIUM 8.7 07/24/2021    MG 1.8 04/13/2020    PHOS 3.0 07/20/2021       Lab Results   Component Value Date    BILITOT 0.4 07/23/2021    BILIDIR 0.40 06/01/2018    PROT 6.2 07/23/2021    ALBUMIN 3.8 07/23/2021    ALT 17 07/23/2021    AST 23 07/23/2021    ALKPHOS 74 07/23/2021    GGT 1,007 (H) 05/28/2018       Lab Results   Component Value Date    PT 10.5 07/24/2021    INR 1.02 07/24/2021    APTT 27.3 07/24/2021       Physical Exam    Vascular: Palpable pulses bilateral.  Hair growth to digits present.    Dermatologic: The left hallux nail has some erythema at the proximal border.  This erythema extends from medial to lateral proximal to the nail plate.  There is no pain to palpation however there is a slight amount of bleeding seen here.    Orthopedic: Digital contractures noted and muscle strength intact.    Neurologic: Patient has essentially total loss of epicritic sensation to sharp and dull.      Assessment/Plan:    Onychia left hallux nail proximal nail border    Plan:    Most likely this patient's condition was aggravated by the yard work that he did causing the nail to be forced approximately leading to an onychia of the proximal nail plate.  I discussed the removal of the nail plate to resolve the problem and he agrees.    Explained to patient nature the procedure, risk, benefits, expected postoperative course.  No guarantees, questions answered.    Consent is signed.    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  Utilizing instrumentation brought from the operating room which included a Therapist, nutritional and a hemostat the nail plate was freed utilizing a Therapist, nutritional which was already loose proximally and then the nail plate was removed utilizing a hemostat.  No purulence was found however it was noted that the nail plate was very loose proximally and also proximal medial proximal and lateral.  Bleeding was coagulated with pressure.    The toe was then bandaged with Adaptic, 4 x 4's and Kerlix.    For podiatry standpoint we do not anticipate any other procedures and this can be treated on an outpatient basis.    Suggest he soak the area once a day in lukewarm water with Epsom salt change once a day with antibiotic cream, and a Band-Aid.    Order for Bactroban ointment placed to be sent home with.    Please send home on 7 to 10 days of oral antibiotics.    Patient should follow-up with me on Monday, 07/30/2021 at 8 AM.     LOS: 1 day        Torsten Weniger J Pierre Dellarocco, DPM  07/25/2021   7:22 AM

## 2021-07-25 NOTE — Unmapped (Signed)
Problem: Skin Injury Risk Increased  Goal: Skin Health and Integrity  Outcome: Progressing     Problem: Fall Injury Risk  Goal: Absence of Fall and Fall-Related Injury  Outcome: Progressing     Problem: Adult Inpatient Plan of Care  Goal: Plan of Care Review  Outcome: Progressing  Goal: Patient-Specific Goal (Individualized)  Outcome: Progressing  Goal: Absence of Hospital-Acquired Illness or Injury  Outcome: Progressing  Goal: Optimal Comfort and Wellbeing  Outcome: Progressing  Goal: Readiness for Transition of Care  Outcome: Progressing  Goal: Rounds/Family Conference  Outcome: Progressing     Problem: Impaired Wound Healing  Goal: Optimal Wound Healing  Outcome: Progressing

## 2021-07-25 NOTE — Unmapped (Signed)
Physician Discharge Summary [facility]  [address] [facility]  [address]  [place] [address]  Dept: [phone]  Loc: [phone]     Identifying Information:   Cory Bentley  05-15-1960  [id number]    Primary Care Physician: Herschell Dimes, FNP   Code Status: Full Code    Admit Date: 07/23/2021    Discharge Date: 07/25/2021     Discharge To: Home    Discharge Service: [facility] - Hospitalists Admit     Discharge Attending Physician: No att. providers found    Discharge Diagnoses:    Primary discharge diagnoses  #Cellulitis of left great toe.  #Hypertension    Secondary discharge diagnoses  #T2DM with long-term current use of insulin  #GERD  #Psoriasis    Outpatient Provider Follow Up Issues:   -Follow up with podiatry after removal of nail  -Follow-up with PCP for management of diabetes and other chronic medical conditions.    Hospital Course:   For the details of admission, please see the H&P on 07/23/2021. For full details, please see progress notes, consult notes, and ancillary notes. The patient's hospital course will be summarized in a problem based approach below.    Cory Bentley is a 61 yo male with a PMHx of T2DM, remote history of osteomyelitis of second right toe status post amputation, HTN, psoriasis, GERD who presented to [facility] on 07/23/2021 with concern for left great toe cellulitis in the setting of poorly controlled diabetes.    In the ED, the patient was afebrile with a heart rate of 83, respiratory rate of 16, BP of 151/78, and SPO2 of 97% on RA.  CBC showed no leukocytosis, Hgb 13.2, and platelets 168.  CMP was unremarkable.  Uric acid was wnl. Lactate was wnl. Blood cultures were drawn. Hgb A1c was 7.5.  Left foot x-ray showed no evidence of osteomyelitis or acute abnormality.  Left great toe was noted to be erythematous, warm, and swollen but without any discharge or erythematous streaking. The patient denied pain due to severely reduced sensation in the setting of diabetic neuropathy. The patient has a known history of previous osteomyelitis of the right second toe requiring amputation. He was started on broad-spectrum coverage with Vancomycin. Blood cultures showed no growth to date. The patient remained afebrile without leukocytosis. Podiatry was consulted and removed the patient's toenail. The patient was transitioned to a 7-day course of Keflex at discharge and instructed to follow-up with podiatry next week. The patient was also noted to be hypertensive during the admission, reaching a high of 199/94, and initially requiring adjustment to his home dose of Lisinopril but then requiring the addition of nifedipine.  Patient was also provided a blood pressure measuring kit and was instructed to maintain a home blood pressure log to follow-up with his PCP within 1 to 2 weeks of discharge for continued monitoring and evaluation of his blood pressures.  The patient was agreeable to the plan and verbalized understanding. All questions were answers. The patient was deemed hemodynamically stable and appropriate for discharge.     All electrolyte derangements were appropriately repleted during the hospital course as noted in daily progress notes and were within normal limits at the time of discharge, with any exceptions noted above.The remainder of the patient's chronic medical conditions were managed per the patient's home medication regimen except as noted in the plan above and in the medication list below.    Procedures:  None  ______________________________________________________________________  Discharge Medications:  Your Medication List        START taking these medications      blood pressure monitor Kit  1 each by Miscellaneous route in the morning.     cephalexin 500 MG capsule  Commonly known as: KEFLEX  Take 1 capsule (500 mg total) by mouth every six (6) hours for 7 days.     mupirocin 2 % ointment  Commonly known as: BACTROBAN  Apply topically daily for 7 days. NIFEdipine 30 MG 24 hr tablet  Commonly known as: PROCARDIA XL  Take 1 tablet (30 mg total) by mouth daily.            CHANGE how you take these medications      lisinopriL 30 MG tablet  Commonly known as: PRINIVIL,ZESTRIL  Take 1 tablet (30 mg total) by mouth daily.  What changed:   medication strength  how much to take            CONTINUE taking these medications      alcohol swabs Padm  Commonly known as: ALCOHOL PADS  Apply 1 swab topically four (4) times a day.     atorvastatin 40 MG tablet  Commonly known as: LIPITOR  Take 1 tablet (40 mg total) by mouth daily.     BD ULTRA-FINE MINI PEN NEEDLE 31 gauge x 3/16 (5 mm) Ndle  Generic drug: pen needle, diabetic  Inject 4 times daily     betamethasone valerate 0.1 % ointment  Commonly known as: VALISONE  Apply to thick areas of psoriasis twice daily as needed until smooth. Avoid face and skin folds.     blood sugar diagnostic Strp  Accu Check Aviva Strip, use 4 times per day for monitoring insulin dependant uncontrolled diabetes     blood-glucose meter kit  Use as instructed     diaper,brief,adult,disposable Misc  1 each by Miscellaneous route two (2) times a day.     empagliflozin 10 mg tablet  Commonly known as: JARDIANCE  Take 2 tablets (20 mg total) by mouth daily.     empty container Misc  Use as directed to dispose of Stelara syringes.     FREESTYLE LIBRE 14 DAY SENSOR kit  Generic drug: flash glucose sensor  Apply new sensor every 14 days and use as directed to monitor and check blood sugars     gabapentin 800 MG tablet  Commonly known as: NEURONTIN  Take 1 tablet (800 mg total) by mouth Three (3) times a day.     hydrocortisone 2.5 % ointment  Apply to psoriasis in ears up to twice daily as needed until smooth.     insulin detemir U-100 100 unit/mL (3 mL) injection pen  Commonly known as: LEVEMIR  Inject 0.52 mL (52 Units total) under the skin daily.     NovoLOG FlexPen U-100 Insulin 100 unit/mL (3 mL) injection pen  Generic drug: insulin ASPART  Inject 0.1 mL (10 Units total) under the skin Three (3) times a day before meals.     omeprazole 20 MG capsule  Commonly known as: PriLOSEC  Take 1 capsule (20 mg total) by mouth daily.     STELARA 45 mg/0.5 mL Syrg syringe  Generic drug: ustekinumab  Inject the contents of 1 syringe (45 mg) under the skin every 12 weeks     testosterone 20.25 mg/1.25 gram (1.62 %) gel pump  Place 2 sprays (40.5 mg total) on the skin daily.  Allergies:  Patient has no known allergies.    ______________________________________________________________________  Physical Exam On Discharge:  Physical Exam  Constitutional:       General: He is not in acute distress.     Appearance: He is not ill-appearing.   Cardiovascular:      Rate and Rhythm: Normal rate and regular rhythm.      Pulses: Normal pulses.      Heart sounds: Normal heart sounds. No murmur heard.  Pulmonary:      Effort: Pulmonary effort is normal. No respiratory distress.      Breath sounds: Normal breath sounds. No wheezing or rhonchi.   Abdominal:      General: Bowel sounds are normal. There is no distension.      Palpations: Abdomen is soft.      Tenderness: There is no abdominal tenderness. There is no guarding.   Musculoskeletal:         General: Normal range of motion.      Right lower leg: No edema.      Left lower leg: No edema.      Right Lower Extremity: (Amputation of right second toe)  Feet:      Comments: Left great toe wrapped in kerlex. Dressing clean, dry, and intact.   Skin:     General: Skin is warm and dry.      Capillary Refill: Capillary refill takes less than 2 seconds.      Findings: No rash.   Neurological:      General: No focal deficit present.      Mental Status: He is alert and oriented to person, place, and time.      Sensory: No sensory deficit.      Motor: No weakness.       ______________________________________________________________________  Pending Test Results (if blank, then none):  Pending Labs       Order Current Status    Blood Culture #1 Preliminary result    Blood Culture #2 Preliminary result            Most Recent Labs:  All lab results last 24 hours -   Recent Results (from the past 24 hour(s))   POCT Glucose    Collection Time: 07/24/21  4:56 PM   Result Value Ref Range    Glucose, POC 69 (L) 80 - 110 mg/dL   POCT Glucose    Collection Time: 07/24/21  7:58 PM   Result Value Ref Range    Glucose, POC 161 (H) 80 - 110 mg/dL   Basic Metabolic Panel    Collection Time: 07/25/21  6:40 AM   Result Value Ref Range    Sodium 140 135 - 153 mmol/L    Potassium 4.1 3.5 - 5.3 mmol/L    Chloride 108 98 - 110 mmol/L    CO2 25.0 24.0 - 31.0 mmol/L    Anion Gap 7 5 - 16 mmol/L    BUN 26 (H) 8 - 21 mg/dL    Creatinine 1.61 0.96 - 1.40 mg/dL    BUN/Creatinine Ratio 29 (H) 6 - 20    eGFR CKD-EPI (2021) Male >90 >=60 mL/min/1.59m2    Glucose 122 (H) 75 - 110 mg/dL    Calcium 8.8 8.5 - 04.5 mg/dL   CBC w/ Differential    Collection Time: 07/25/21  6:40 AM   Result Value Ref Range    WBC 8.6 4.8 - 10.8 10*9/L    RBC 4.38 (L) 4.70 - 6.10 10*12/L  HGB 12.8 (L) 14.0 - 18.0 g/dL    HCT 09.6 (L) 04.5 - 52.0 %    MCV 93.2 80.0 - 104.0 fL    MCH 29.2 26.0 - 34.0 pg    MCHC 31.4 31.0 - 35.5 g/dL    RDW 40.9 81.1 - 91.4 %    MPV 13.7 (H) 7.0 - 10.0 fL    Platelet 167 130 - 400 10*9/L    Immature Platelet Fraction 21 (H) 1 - 7 %    Neutrophils % 51.6 %    Immature Granulocytes Relative Percent 0.2 %    Lymphocytes % 27.0 %    Monocytes % 11.6 %    Eosinophils % 8.9 %    Basophils % 0.7 %    Absolute Neutrophils 4.5 1.4 - 6.5 10*9/L    Immature Granulocytes Absolute Count 0.0 0.0 - 0.6 10*9/L    Absolute Lymphocytes 2.3 1.2 - 3.4 10*9/L    Absolute Monocytes 1.0 (H) 0.1 - 0.5 10*9/L    Absolute Eosinophils 0.8 (H) 0.0 - 0.7 10*9/L    Absolute Basophils 0.1 0.0 - 0.4 10*9/L   POCT Glucose    Collection Time: 07/25/21  7:42 AM   Result Value Ref Range    Glucose, POC 97 80 - 110 mg/dL       Relevant Studies/Radiology (if blank, then none):  XR Foot 3 Or More Views Left   Final Result   1.  No acute abnormality or radiographic evidence of osteomyelitis in the left foot.   Electronically signed by:  Reed Pandy MD  07/24/2021 12:11 AM CDT Workstation: [id number]          ______________________________________________________________________  Discharge Instructions:     Diet Instructions       Discharge diet (specify)      Discharge Nutrition Therapy: Consistent Carb    Consistent Carb Level: Consistent Carb 75/75/75 (5/5/5)            Follow Up instructions and Outpatient Referrals     Ambulatory referral to Podiatry      Discharge instructions      Discharge instructions          Other Instructions       Discharge instructions      Discharge instructions      Soak the area once a day in lukewarm water with Epsom salt change once a day with antibiotic cream, and a Band-Aid.    Discharge instructions to patient: Call your primary care doctor and make an appointment to see them:      Within 2 weeks from the time you are discharged from the hospital            Appointments which have been scheduled for you      Aug 15, 2021 10:30 AM  (Arrive by 10:15 AM)  RETURN  GENERAL with Shela Commons, ANP  [facility]) [address]  [place] [address]  [phone]        Aug 29, 2021 10:20 AM  (Arrive by 10:05 AM)  RETURN CONTINUITY with Amie Portland, FNP  [facility] [address] AT [facility]) [address] [place] [address]  [phone]        Dec 25, 2021  1:00 PM  (Arrive by 12:45 PM)  RETURN  GENERAL with Abbie Sons, MD  [facility] [place]) [address] [place] [address]  [phone]             ______________________________________________________________________  Discharge Day Services:  BP 161/80  - Pulse 88  - Temp 36.8 ??C (98.3 ??F) (Oral)  - Resp 18  - Ht 172.7 cm (5' 8)  - Wt 96.7 kg (213 lb 4 oz)  - SpO2 100%  - BMI 32.42 kg/m??   Pt seen on the day of discharge and determined appropriate for discharge.    Condition at Discharge: good    Length of Discharge: I spent greater than 30 mins in the discharge of this patient.

## 2021-08-01 DIAGNOSIS — Z794 Long term (current) use of insulin: Principal | ICD-10-CM

## 2021-08-01 DIAGNOSIS — E11628 Type 2 diabetes mellitus with other skin complications: Principal | ICD-10-CM

## 2021-08-01 MED ORDER — LEVEMIR FLEXTOUCH U-100 INSULIN 100 UNIT/ML (3 ML) SUBCUTANEOUS PEN
Freq: Every day | SUBCUTANEOUS | 1 refills | 86 days | Status: CP
Start: 2021-08-01 — End: 2022-08-01

## 2021-08-14 NOTE — Unmapped (Signed)
PCP:  Herschell Dimes, FNP    08/14/2021  [id number]  [place] [place]  [address]  [name]  [place] [address]  Dept: [phone]  Loc: [phone]    Chief Complaint: Evaluation of proteinuria    HPI:  [name] is a 61 y.o.  male who presents for evaluation of proteinuria. 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 His diabetes was poorly controlled for many years, with a documented a1c on his chart of 14%. He notes that this was fairly typical for him during that time period and many times he would max out his glucometer. His glucose is much better controlled now, but there is still room for improvement.     FH is positive for a cousin who required kidney transplant x2. The cause of renal disease is unknown. FH is also positive for DM2.     Was in ED 07/25/2021 for cellulitis to his right great toe. His SBP was noted to be 200 and his lisinopril was increased to 30mg  and he was started on nifedipine 30mg . He is not taking the nifedipine at this time as it made him feel strange with some parathesias to his neck.     ROS:   CONSTITUTIONAL: denies fevers or chills, denies unintentional weight loss   CARDIOVASCULAR: denies chest pain, denies dyspnea on exertion, denies leg edema  GASTROINTESTINAL: denies nausea, denies vomiting, denies anorexia  GENITOURINARY: denies dysuria, denies hematuria, denies decreased urinary stream  All 10 systems reviewed and are negative except as listed above.    PAST MEDICAL HISTORY:  Past Medical History:   Diagnosis Date   ??? Diabetes mellitus (CMS-HCC)    ??? Hypertension    ??? Psoriasis    ??? Substance abuse (CMS-HCC)        ALLERGIES  Patient has no known allergies.    FAMILY HISTORY  Family History   Problem Relation Age of Onset   ??? Diabetes Mother    ??? Diabetes type II Mother    ??? Ovarian cancer Mother    ??? Diabetes Father    ??? Diabetes type II Father    ??? Diabetes type II Sister    ??? Diabetes type II Brother    ??? Diabetes type II Maternal Grandmother    ??? Diabetes type II Maternal Grandfather    ??? Diabetes type II Paternal Grandmother    ??? Diabetes type II Paternal Grandfather    ??? Melanoma Neg Hx    ??? Basal cell carcinoma Neg Hx    ??? Squamous cell carcinoma Neg Hx        SOCIAL HISTORY   reports that he has never smoked. He has never used smokeless tobacco. He reports current alcohol use. He reports current drug use. Frequency: 7.00 times per week. Drug: Marijuana.    MEDICATIONS:  Current Outpatient Medications   Medication Sig Dispense Refill   ??? alcohol swabs (ALCOHOL PADS) PadM Apply 1 swab topically four (4) times a day. 400 each 1   ??? atorvastatin (LIPITOR) 40 MG tablet Take 1 tablet (40 mg total) by mouth daily. 90 tablet 1   ??? BD ULTRA-FINE MINI PEN NEEDLE 31 gauge x 3/16 (5 mm) Ndle Inject 4 times daily 100 each 0   ??? betamethasone valerate (VALISONE) 0.1 % ointment Apply  to thick areas of psoriasis twice daily as needed until smooth. Avoid face and skin folds. 45 g 8   ??? blood pressure monitor Kit 1 each by Miscellaneous route in the morning. 1 kit 0   ??? blood sugar diagnostic Strp Accu Check Aviva Strip, use 4 times per day for monitoring insulin dependant uncontrolled diabetes     ??? blood-glucose meter kit Use as instructed 1 each 0   ??? diaper,brief,adult,disposable Misc 1 each by Miscellaneous route two (2) times a day. 20 each 2   ??? empagliflozin (JARDIANCE) 10 mg tablet Take 2 tablets (20 mg total) by mouth daily. 180 tablet 1   ??? empty container Misc Use as directed to dispose of Stelara syringes. 1 each 2   ??? flash glucose sensor (FREESTYLE LIBRE 14 DAY SENSOR) kit Apply new sensor every 14 days and use as directed to monitor and check blood sugars 2 each 12   ??? gabapentin (NEURONTIN) 800 MG tablet Take 1 tablet (800 mg total) by mouth Three (3) times a day. 270 tablet 1   ??? hydrocortisone 2.5 % ointment Apply to psoriasis in ears up to twice daily as needed until smooth. 30 g 4   ??? insulin detemir U-100 (LEVEMIR FLEXTOUCH U-100 INSULN) 100 unit/mL (3 mL) injection pen Inject 0.52 mL (52 Units total) under the skin daily. 45 mL 1   ??? lisinopriL (PRINIVIL,ZESTRIL) 30 MG tablet Take 1 tablet (30 mg total) by mouth daily. 30 tablet 0   ??? NIFEdipine (PROCARDIA XL) 30 MG 24 hr tablet Take 1 tablet (30 mg total) by mouth daily. 30 tablet 1   ??? NOVOLOG FLEXPEN U-100 INSULIN 100 unit/mL (3 mL) injection pen Inject 0.1 mL (10 Units total) under the skin Three (3) times a day before meals. 27 mL 3   ??? omeprazole (PRILOSEC) 20 MG capsule Take 1 capsule (20 mg total) by mouth daily. 90 capsule 3   ??? testosterone 20.25 mg/1.25 gram (1.62 %) gel pump Place 2 sprays (40.5 mg total) on the skin daily. 75 g 0   ??? ustekinumab (STELARA) 45 mg/0.5 mL Syrg syringe Inject the contents of 1 syringe (45 mg) under the skin every 12 weeks 2 mL 3     No current facility-administered medications for this visit.       PHYSICAL EXAM:  Wt Readings from Last 3 Encounters:   07/25/21 96.7 kg (213 lb 4 oz)   03/30/21 92.5 kg (204 lb)   03/02/21 94.8 kg (209 lb)     Temp Readings from Last 3 Encounters:   07/25/21 36.8 ??C (98.3 ??F) (Oral)   03/30/21 36.7 ??C (98 ??F) (Oral)   03/02/21 36.6 ??C (97.9 ??F) (Oral)     BP Readings from Last 5 Encounters:   07/25/21 161/80   03/30/21 134/82   03/02/21 152/90   01/24/21 180/100   11/30/20 128/82     Pulse Readings from Last 3 Encounters:   07/25/21 88   03/30/21 89   03/02/21 71       CONSTITUTIONAL: Alert,well appearing, no distress. Ambulates with a cane  HEENT: Moist mucous membranes, oropharynx clear without erythema or exudate  EYES: Extra ocular movements intact. Pupils reactive, sclerae anicteric.  NECK: Supple, no lymphadenopathy  CARDIOVASCULAR: Regular, normal S1/S2 heart sounds, no murmurs, no rubs.   PULM: Clear to auscultation bilaterally  GASTROINTESTINAL: Soft, active bowel sounds, nontender  EXTREMITIES: No lower extremity edema bilaterally.   SKIN: No rashes or lesions  NEUROLOGIC: No focal  motor or sensory deficits    MEDICAL DECISION MAKING  Urinalysis performed in office revealed specific gravity 1.015 with pH of 6 and was negative for leukocyte esterase, nitrite, urobilinogen, protein, blood, ketone, bilirubin or glucose.     Creatinine   Date Value Ref Range Status   07/25/2021 0.90 0.50 - 1.40 mg/dL Final   16/01/9603 5.40 0.50 - 1.40 mg/dL Final   98/02/9146 8.29 0.50 - 1.40 mg/dL Final   56/21/3086 5.78 0.60 - 1.10 mg/dL Final   46/96/2952 8.41 0.60 - 1.10 mg/dL Final   32/44/0102 7.25 0.60 - 1.10 mg/dL Final   36/64/4034 1.0 0.5 - 1.4 mg/dL Final   74/25/9563 0.9 0.5 - 1.4 mg/dL Final   87/56/4332 0.8 0.5 - 1.4 mg/dL Final   95/18/8416 1.1 0.5 - 1.4 mg/dL Final   60/63/0160 1.0 0.5 - 1.4 mg/dL Final   10/93/2355 1.2 0.5 - 1.4 mg/dL Final   73/22/0254 1.1 0.5 - 1.4 mg/dL Final   27/09/2374 1.0 0.5 - 1.4 mg/dL Final   28/31/5176 0.8 0.5 - 1.4 mg/dL Final   16/10/3708 0.9 0.5 - 1.4 mg/dL Final        Lab Results   Component Value Date    NA 140 07/25/2021    K 4.1 07/25/2021    CL 108 07/25/2021    CO2 25.0 07/25/2021    BUN 26 (H) 07/25/2021    CREATININE 0.90 07/25/2021    GLU 122 (H) 07/25/2021    CALCIUM 8.8 07/25/2021    ALBUMIN 3.8 07/23/2021    PHOS 3.0 07/20/2021       Lab Results   Component Value Date    CALCIUM 8.8 07/25/2021       Lab Results   Component Value Date    BILITOT 0.4 07/23/2021    BILIDIR 0.40 06/01/2018    PROT 6.2 07/23/2021    ALBUMIN 3.8 07/23/2021    ALT 17 07/23/2021    AST 23 07/23/2021    ALKPHOS 74 07/23/2021    GGT 1,007 (H) 05/28/2018     Lab Results   Component Value Date    INR 1.02 07/24/2021    APTT 27.3 07/24/2021       HGB   Date Value Ref Range Status   07/25/2021 12.8 (L) 14.0 - 18.0 g/dL Final   62/69/4854 62.7 (L) 14.0 - 18.0 g/dL Final   03/50/0938 18.2 (L) 14.0 - 18.0 g/dL Final 99/37/1696 78.9 38.1 - 16.5 g/dL Final   01/75/1025 85.2 (L) 14.0 - 18.0 g/dL Final   77/82/4235 36.1 (L) 14.0 - 18.0 g/dL Final   44/31/5400 86.7 (L) 14.0 - 18.0 g/dL Final   61/95/0932 67.1 (L) 14.0 - 18.0 g/dL Final   24/58/0998 33.8 (L) 14.0 - 18.0 g/dL Final   25/08/3974 73.4 (L) 14.0 - 18.0 g/dL Final   19/37/9024 09.7 (L) 14.0 - 18.0 g/dL Final   35/32/9924 26.8 (L) 14.0 - 18.0 g/dL Final   34/19/6222 97.9 (L) 14.0 - 18.0 g/dL Final   89/21/1941 74.0 (L) 14.0 - 18.0 g/dL Final        Lab Results   Component Value Date    WBC 8.6 07/25/2021    HGB 12.8 (L) 07/25/2021    HCT 40.8 (L) 07/25/2021    PLT 167 07/25/2021     Lab Results   Component Value Date    IRON 90 01/27/2020    TIBC 319 01/27/2020    FERRITIN 47.4 01/27/2020       Lab  Results   Component Value Date    A1C 7.5 (H) 07/24/2021    A1C 7.1 (A) 03/02/2021    A1C 7.7 (A) 11/30/2020       Lab Results   Component Value Date    Color, UA Yellow 03/30/2019    Protein, Ur 86.6 07/20/2021    Glucose, UA Trace (A) 03/30/2019    Ketones, UA Negative 03/30/2019    Blood, UA Trace (A) 03/30/2019    Nitrite, UA Negative 03/30/2019   ]    Lab Results   Component Value Date    PROTEINUR 86.6 07/20/2021    CREATUR 59.0 07/20/2021     Lab Results   Component Value Date    PCRATIOUR 1.468 07/20/2021    PCRATIOUR 0.943 01/24/2021       Lab Results   Component Value Date    CHOL 141 08/29/2020    A1C 7.5 (H) 07/24/2021    ANA Negative 05/27/2018    HEPAIGG Nonreactive 04/22/2018    HEPBCAB Nonreactive 09/26/2020    HEPCAB Nonreactive 09/26/2020       ECG 12 Lead  Normal sinus rhythm  Anteroseptal infarct (cited on or before 26-Mar-2019)  Abnormal ECG  Confirmed by Arnette Norris ([id number]) on 07/27/2021 9:15:32 PM       ASSESSMENT/PLAN:    Mr.Cory Bentley is a 61 y.o. patient with a past medical history significant for DM2 w/ DR who is being seen for evaluation of proteinuria.     1. Chronic Kidney Disease Stage 1 - the patient has preserved renal function however he is developing proteinuria which is an indicator of the onset of renal disease. At this time, I urged him to maintain stringent glucose control. He is on Jardiance and lisinopril. If possible, we can try to increase the dose of lisinopril. Continue to check BMP and Urine pro/cr ratio before each visit.     2. HTN - manual 142/82. Increase lisinopril to 40mg . Can add aldactone next if needed. Hold off on nifedipine as it may have caused parathesias     3. DM2 w/ DR - A1c has been as high as 14% in the past, but most recently is 7.5%. Ideally, I would like him to be <7%. Defer to PCP for management. I did tell him I agree with the recommendation that he see an endocrinologist.     Mr.Cory Bentley will follow up in 6-12 month(s) with the following labs within 1 weeks of their next visit: RFP, Urine pro/creat ratio      Disclaimer: Much of the narrative of this dictation was acquired using speech recognition software. It is possible that some dictated speech was not transcribed accurately by this system nor detected in the proofing. Such errors are at times unavoidable.

## 2021-08-15 ENCOUNTER — Ambulatory Visit: Admit: 2021-08-15 | Discharge: 2021-08-16 | Payer: MEDICARE

## 2021-08-15 DIAGNOSIS — I10 Essential (primary) hypertension: Principal | ICD-10-CM

## 2021-08-15 DIAGNOSIS — Z794 Long term (current) use of insulin: Principal | ICD-10-CM

## 2021-08-15 DIAGNOSIS — R809 Proteinuria, unspecified: Principal | ICD-10-CM

## 2021-08-15 DIAGNOSIS — E11628 Type 2 diabetes mellitus with other skin complications: Principal | ICD-10-CM

## 2021-08-15 MED ORDER — LISINOPRIL 40 MG TABLET
ORAL_TABLET | Freq: Every day | ORAL | 3 refills | 90 days | Status: CP
Start: 2021-08-15 — End: 2021-09-14

## 2021-08-29 ENCOUNTER — Ambulatory Visit: Admit: 2021-08-29 | Payer: MEDICARE

## 2021-09-05 ENCOUNTER — Ambulatory Visit: Admit: 2021-09-05 | Discharge: 2021-09-06 | Payer: MEDICARE

## 2021-09-05 DIAGNOSIS — Z794 Long term (current) use of insulin: Principal | ICD-10-CM

## 2021-09-05 DIAGNOSIS — R7989 Other specified abnormal findings of blood chemistry: Principal | ICD-10-CM

## 2021-09-05 DIAGNOSIS — E1169 Type 2 diabetes mellitus with other specified complication: Principal | ICD-10-CM

## 2021-09-05 DIAGNOSIS — E1142 Type 2 diabetes mellitus with diabetic polyneuropathy: Principal | ICD-10-CM

## 2021-09-05 DIAGNOSIS — E11628 Type 2 diabetes mellitus with other skin complications: Principal | ICD-10-CM

## 2021-09-05 DIAGNOSIS — I10 Essential (primary) hypertension: Principal | ICD-10-CM

## 2021-09-05 DIAGNOSIS — L089 Local infection of the skin and subcutaneous tissue, unspecified: Principal | ICD-10-CM

## 2021-09-05 DIAGNOSIS — E785 Hyperlipidemia, unspecified: Principal | ICD-10-CM

## 2021-09-05 LAB — LIPID PANEL
CHOLESTEROL/HDL RATIO SCREEN: 2.7 (ref 1.0–4.5)
CHOLESTEROL: 119 mg/dL (ref <=200)
HDL CHOLESTEROL: 44 mg/dL (ref 40–60)
LDL CHOLESTEROL CALCULATED: 60 mg/dL (ref 40–99)
NON-HDL CHOLESTEROL: 75 mg/dL (ref 70–130)
TRIGLYCERIDES: 73 mg/dL (ref 0–150)
VLDL CHOLESTEROL CAL: 14.6 mg/dL (ref 12–47)

## 2021-09-05 LAB — ALBUMIN / CREATININE URINE RATIO
ALBUMIN QUANT URINE: 37.2 mg/dL
ALBUMIN/CREATININE RATIO: 705.9 ug/mg — ABNORMAL HIGH (ref 0.0–30.0)
CREATININE, URINE: 52.7 mg/dL

## 2021-09-05 MED ORDER — ATORVASTATIN 40 MG TABLET
ORAL_TABLET | Freq: Every day | ORAL | 1 refills | 90.00000 days | Status: CP
Start: 2021-09-05 — End: ?

## 2021-09-05 MED ORDER — GABAPENTIN 800 MG TABLET
ORAL_TABLET | Freq: Three times a day (TID) | ORAL | 1 refills | 90 days | Status: CP
Start: 2021-09-05 — End: 2022-09-05

## 2021-09-05 MED ORDER — EMPAGLIFLOZIN 10 MG TABLET
ORAL_TABLET | Freq: Every day | ORAL | 1 refills | 90 days | Status: CP
Start: 2021-09-05 — End: ?

## 2021-09-05 MED ORDER — CEPHALEXIN 500 MG CAPSULE
ORAL_CAPSULE | Freq: Four times a day (QID) | ORAL | 0 refills | 10 days | Status: CP
Start: 2021-09-05 — End: 2021-09-15

## 2021-09-05 MED ORDER — TESTOSTERONE 20.25 MG/1.25 GRAM PER PUMP ACT.(1.62 %) TRANSDERMAL GEL
Freq: Every day | TRANSDERMAL | 0 refills | 0 days | Status: CP
Start: 2021-09-05 — End: ?

## 2021-09-05 MED ORDER — LEVEMIR FLEXTOUCH U-100 INSULIN 100 UNIT/ML (3 ML) SUBCUTANEOUS PEN
Freq: Every day | SUBCUTANEOUS | 1 refills | 86.00000 days | Status: CP
Start: 2021-09-05 — End: 2022-09-05

## 2021-09-05 MED ORDER — NOVOLOG FLEXPEN U-100 INSULIN ASPART 100 UNIT/ML (3 ML) SUBCUTANEOUS
Freq: Three times a day (TID) | SUBCUTANEOUS | 3 refills | 90.00000 days | Status: CP
Start: 2021-09-05 — End: 2022-09-05

## 2021-09-05 NOTE — Unmapped (Signed)
Assessment and Plan:     Cory Bentley was seen today for follow-up.    Diagnoses and all orders for this visit:    PHQ-2 Score: 0  PHQ-9 Score:    Edinburgh Score:    Screening complete, no depression identified / no further action needed today    Type 2 diabetes mellitus with other skin complication, with long-term current use of insulin (CMS-HCC)    Pt here for follow up.   Pt is currently taking Jardiance 20mg  daily, 52 units Insulin Levemir daily, and 8-10 units Insulin Novolog 3 times a day.  Pt reports blood sugars at home reading around 155.   His last A1c 07/24/2021 was 7.5  Will continue current medication regimen as listed above and check albumin/creatinine urine ration today.    -     Albumin/creatinine urine ratio  -     Podiatry; Future  -     empagliflozin (JARDIANCE) 10 mg tablet; Take 2 tablets (20 mg total) by mouth daily.  -     insulin detemir U-100 (LEVEMIR FLEXTOUCH U100 INSULIN) 100 unit/mL (3 mL) injection pen; Inject 0.52 mL (52 Units total) under the skin daily.  -     NOVOLOG FLEXPEN U-100 INSULIN 100 unit/mL (3 mL) injection pen; Inject 0.1 mL (10 Units total) under the skin Three (3) times a day before meals.    Diabetic polyneuropathy associated with type 2 diabetes mellitus (CMS-HCC)    Pt noted to have failed foot sensation exam. Pt reports in the hospital they removed his toenail without any pain medication because he could not feel anything. Pt with 2+ pedal pulses in both feet during today's visit.  Pt is currently on Gabapentin 800mg  3 times a day- will continue current regimen and refer to podiatry.     -     Podiatry; Future  -     gabapentin (NEURONTIN) 800 MG tablet; Take 1 tablet (800 mg total) by mouth Three (3) times a day.    Infection of great toe    Pt was recently in hospital 07/25/2021.   Pt reports during hospital stay they removed his left great toe toenail. Pt reports he applied topical antibiotic cream as prescribed.   Today on exam, left great toe appears to be red and swollen. Pt denies pain to foot - reports he cannot normally feel foot due to neuropathy.   Pt failed diabetic foot exam only feeling 2 of 8 sites tested   Pt denies fevers at home- able to ambulate at baseline. Will start Keflex 500mg  4 times daily for 10 days due to appearance of toe and patients comorbid conditions.     [facility] podiatry never followed up with patient after hospital visit: Will place new order for podiatry consult with [facility] due to patient's neuropathy, diabetes, and poor foot wound healing.    -     Podiatry; Future  -     cephalexin (KEFLEX) 500 MG capsule; Take 1 capsule (500 mg total) by mouth four (4) times a day for 10 days.    Low testosterone in male    Pt is currently taking 40.5mg  testosterone gel daily due to low testosterone. Pt reports no improvement in erectile dysfunction symptoms with current dosage. Pt reports inability to obtain erection. Will bring patient back to office to check testosterone level between 8am-10am at his earliest convenience to see where levels are.    -     Testosterone, free, total; Future  -  testosterone 20.25 mg/1.25 gram (1.62 %) gel pump; Place 2 sprays (40.5 mg total) on the skin daily.    Primary hypertension    Pt is currently taking Lisinopril 40mg  daily.   Pt initial BP in office 142/92. When rechecked decreased to 128/90.   Pt denies CP, SHOB, DOE, palpitations, dizziness, HA's, vision changes     Per nephrology last note, if patient BP remain elevated plan is to add Aldactone.   Pt BP is currently borderline - via shared decision making will defer adding Aldactone and this time and recheck at next office visit.     -     Albumin/creatinine urine ratio    Hyperlipidemia associated with type 2 diabetes mellitus (CMS-HCC)    Pt is currently taking Lipitor 40 mg daily. Pt denies N/V/D/abdominal pain associated with medication. Will check lipid panel today and continue current medication regimen. Pt reports he is fasting.     - Lipid Panel  -     atorvastatin (LIPITOR) 40 MG tablet; Take 1 tablet (40 mg total) by mouth daily.      HPI obtained and examination performed by Wynonia Sours, Nurse Practitioner Student. I was present during the visit, participating in the key components of the service and supervising the time spent by the NP student in work on the day of the clinic visit.  Cory Lowe FNP-C    I personally spent 20 minutes face-to-face and non-face-to-face in the care of this patient, which includes all pre, intra, and post visit time on the date of service.  All documented time was specific to the E/M visit and does not include any procedures that may have been performed.     Return in about 3 months (around 12/06/2021) for Recheck.    Subjective:     HPI: Cory Bentley is a 61 y.o. male here for   Chief Complaint   Patient presents with    Follow-up   :    Diagnoses and all orders for this visit:    Type 2 diabetes mellitus with other skin complication, with long-term current use of insulin (CMS-HCC)    Pt here for follow up. Pt is currently taking Jardiance 20mg  daily, 52 units Insulin Levemir, and 8-10 units Insulin Novolog 3 times a day. Pt reports blood sugars at home reading around 155. Will continue current medication regimen as listed above and check albumin/creatinine urine ration today.    -     Albumin/creatinine urine ratio  -     Podiatry; Future  -     empagliflozin (JARDIANCE) 10 mg tablet; Take 2 tablets (20 mg total) by mouth daily.  -     insulin detemir U-100 (LEVEMIR FLEXTOUCH U100 INSULIN) 100 unit/mL (3 mL) injection pen; Inject 0.52 mL (52 Units total) under the skin daily.  -     NOVOLOG FLEXPEN U-100 INSULIN 100 unit/mL (3 mL) injection pen; Inject 0.1 mL (10 Units total) under the skin Three (3) times a day before meals.    Infection of great toe    Pt was recently in hospital this year around Easter. Pt reports during hospital stay they removed his left great toe toenail. Pt reports he applied topical antibiotic cream as prescribed. Today on exam, left great toe appears to be red and swollen. Pt denies pain to foot- reports he cannot normally feel foot due to neuropathy. Pt denies fevers at home- able to ambulate at baseline. Will start Keflex 500mg  4 times daily for 10 days  due to appearance of toe and patients comorbid conditions.     Podiatry never followed up with patient after hospital visit: will place new order for podiatry consult due to patient's neuropathy, diabetes, and poor foot wound healing.    -     Podiatry; Future  -     cephalexin (KEFLEX) 500 MG capsule; Take 1 capsule (500 mg total) by mouth four (4) times a day for 10 days.    Low testosterone in male    Pt is currently taking 40.5mg  testosterone gel daily due to low testosterone. Pt reports no improvement in erectile dysfunction symptoms with current dosage. Pt reports inability to obtain erection. Will bring patient back to office to check testosterone level between 8am-10am at his earliest convenience to see where levels are.    -     Testosterone, free, total; Future  -     testosterone 20.25 mg/1.25 gram (1.62 %) gel pump; Place 2 sprays (40.5 mg total) on the skin daily.    Primary hypertension    Pt is currently taking Lisinopril 40mg  daily. Pt initial bp in office 142/92. When rechecked decreased to 128/90. Pt denies CP, SHOB, DOE, palpitations, dizziness, HA's, vision changes       Per nephrology last note, if patient bp remain elevated plan is to add Aldactone. Pt bp is currently borderline- via shared decision making will defer adding Aldactone and this time and recheck at next office visit.     -     Albumin/creatinine urine ratio    Hyperlipidemia associated with type 2 diabetes mellitus (CMS-HCC)    Pt is currently taking Lipitor 40 mg daily. Pt denies N/V/D/abdominal pain associated with medication. Will check lipid panel today and continue current medication regimen. Pt reports he is fasting.     -     Lipid Panel  - atorvastatin (LIPITOR) 40 MG tablet; Take 1 tablet (40 mg total) by mouth daily.    Diabetic polyneuropathy associated with type 2 diabetes mellitus (CMS-HCC)    Pt noted to have failed foot sensation exam. Pt reports in the hospital they removed his toenail without any pain medication because he could not feel anything. Pt with 2+ pedal pulses in both feet during today's visit.  Pt is currently on Gabapentin 800mg  3 times a day- will continue current regimen and refer to podiatry.   -     Podiatry; Future  -     gabapentin (NEURONTIN) 800 MG tablet; Take 1 tablet (800 mg total) by mouth Three (3) times a day.         Return in about 3 months (around 12/06/2021) for Recheck.        ROS:   Review of Systems   Constitutional:  Negative for activity change, appetite change, chills, fatigue, fever and unexpected weight change.   HENT:  Negative for congestion, rhinorrhea, sore throat and tinnitus.    Eyes:  Negative for visual disturbance.   Respiratory:  Negative for cough, chest tightness and shortness of breath.    Cardiovascular:  Negative for chest pain, palpitations and leg swelling.   Gastrointestinal:  Negative for abdominal distention, abdominal pain, diarrhea, nausea and vomiting.   Endocrine: Negative for polydipsia, polyphagia and polyuria.   Genitourinary:  Negative for dysuria, penile swelling, testicular pain and urgency.   Musculoskeletal:  Negative for neck pain.   Skin:  Positive for wound (left great toenail). Negative for color change and rash.   Neurological:  Negative for dizziness, light-headedness,  numbness and headaches.        Neuropathy to bilat feet        Review of systems negative unless otherwise noted as per HPI    Objective:     Visit Vitals  BP 142/92   Pulse 71   Temp 36.1 ??C (97 ??F) (Temporal)   Ht 172.7 cm (5' 8)   Wt 90.7 kg (200 lb)   SpO2 98%   BMI 30.41 kg/m??          09/05/21 0941   PainSc: 6         Physical Exam  Constitutional:       General: He is not in acute distress. Appearance: Normal appearance. He is well-developed and well-groomed. He is not ill-appearing or toxic-appearing.   HENT:      Head: Normocephalic.      Right Ear: Hearing normal.      Left Ear: Hearing normal.      Nose: Nose normal.      Mouth/Throat:      Lips: Pink.      Mouth: Mucous membranes are moist.   Eyes:      General: Lids are normal. Vision grossly intact. Gaze aligned appropriately.   Cardiovascular:      Rate and Rhythm: Normal rate and regular rhythm.      Pulses:           Radial pulses are 2+ on the right side and 2+ on the left side.        Dorsalis pedis pulses are 2+ on the right side and 2+ on the left side.        Posterior tibial pulses are 2+ on the right side and 2+ on the left side.      Heart sounds: Normal heart sounds, S1 normal and S2 normal. No murmur heard.  Pulmonary:      Effort: Pulmonary effort is normal. No accessory muscle usage or respiratory distress.      Breath sounds: Normal breath sounds and air entry. No decreased air movement.   Abdominal:      General: Bowel sounds are normal. There is no distension.      Palpations: Abdomen is soft.      Tenderness: There is no abdominal tenderness.   Musculoskeletal:      Cervical back: Full passive range of motion without pain and normal range of motion.      Right lower leg: No edema.      Left lower leg: No edema.   Feet:      Right foot:      Protective Sensation: 8 sites tested.  2 sites sensed.      Skin integrity: No skin breakdown, erythema, warmth, callus or dry skin.      Left foot:      Protective Sensation: 8 sites tested.  2 sites sensed.      Skin integrity: Erythema (to left great toe) present. No skin breakdown, warmth, callus or dry skin.   Skin:     General: Skin is warm and dry.      Capillary Refill: Capillary refill takes less than 2 seconds.      Coloration: Skin is not cyanotic or pale.      Findings: Wound present. No rash.             Comments: Redness and swelling noted to left great toe. Pt able to ambulate at baseline.   Neurological:      Mental  Status: He is alert and oriented to person, place, and time. Mental status is at baseline.   Psychiatric:         Attention and Perception: Attention and perception normal.         Mood and Affect: Mood and affect normal.         Speech: Speech normal.         Behavior: Behavior normal. Behavior is cooperative.         Thought Content: Thought content normal.        PCMH:     Medication adherence and barriers to the treatment plan have been addressed. Opportunities to optimize healthy behaviors have been discussed. Patient / caregiver voiced understanding.

## 2021-09-11 DIAGNOSIS — E1169 Type 2 diabetes mellitus with other specified complication: Principal | ICD-10-CM

## 2021-09-11 DIAGNOSIS — E785 Hyperlipidemia, unspecified: Principal | ICD-10-CM

## 2021-09-11 MED ORDER — ATORVASTATIN 40 MG TABLET
ORAL_TABLET | 1 refills | 0 days
Start: 2021-09-11 — End: ?

## 2021-09-11 NOTE — Unmapped (Signed)
Rx sent on 09/05/21

## 2021-09-12 ENCOUNTER — Other Ambulatory Visit: Admit: 2021-09-12 | Discharge: 2021-09-13 | Payer: MEDICARE

## 2021-09-12 DIAGNOSIS — R7989 Other specified abnormal findings of blood chemistry: Principal | ICD-10-CM

## 2021-09-12 NOTE — Unmapped (Signed)
Good afternoon [name]     I have reviewed your labs     Your albumin creatinine ration is significantly elevated, even more so than one year ago   I will be forwarding this information to your nephrologist and then we will discuss any changes that need to be made to your medications    Cory Bentley

## 2021-09-17 LAB — TESTOSTERONE, FREE, TOTAL
TESTOSTERONE (MAYO): 393 ng/dL
TESTOSTERONE FREE: 12.2 ng/dL

## 2021-09-18 NOTE — Unmapped (Signed)
Good afternoon Cory Bentley     I have reviewed your testosterone level and it is normal     Cory Bentley

## 2021-09-20 NOTE — Unmapped (Signed)
[facility] Refill Coordination Note    Specialty Medication(s) to be Shipped:   Inflammatory Disorders: Stelara    Other medication(s) to be shipped: No additional medications requested for fill at this time     Cory Bentley, DOB: 03-Jan-1961  Phone: [phone] (home) [phone] (work)      All above HIPAA information was verified with patient.     Was a Nurse, learning disability used for this call? No    Completed refill call assessment today to schedule patient's medication shipment from the [facility] [phone]).  All relevant notes have been reviewed.     Specialty medication(s) and dose(s) confirmed: Regimen is correct and unchanged.   Changes to medications: Takes Lisinopril 40 mg now instead of 10 mg  Changes to insurance: No  New side effects reported not previously addressed with a pharmacist or physician: None reported  Questions for the pharmacist: No    Confirmed patient received a Conservation officer, historic buildings and a Surveyor, mining with first shipment. The patient will receive a drug information handout for each medication shipped and additional FDA Medication Guides as required.       DISEASE/MEDICATION-SPECIFIC INFORMATION        For patients on injectable medications: Patient currently has 0 doses left.  Next injection is scheduled for 10/03/2021.    SPECIALTY MEDICATION ADHERENCE     Medication Adherence    Patient reported X missed doses in the last month: 0  Specialty Medication: Stelara  Patient is on additional specialty medications: No  Any gaps in refill history greater than 2 weeks in the last 3 months: no  Demonstrates understanding of importance of adherence: yes  Informant: patient  Reliability of informant: reliable  Confirmed plan for next specialty medication refill: delivery by pharmacy  Refills needed for supportive medications: not needed              Were doses missed due to medication being on hold? No    Stelara 45/0.5 mg/ml: 0 days of medicine on hand REFERRAL TO PHARMACIST     Referral to the pharmacist: Not needed      [facility]     Shipping address confirmed in Epic.     Delivery Scheduled: Yes, Expected medication delivery date: 09/26/2021.     Medication will be delivered via Same Day Courier to the prescription address in Epic WAM.    Griffith Santilli D Harshitha Fretz   [facility] Specialty Technician

## 2021-09-26 MED FILL — STELARA 45 MG/0.5 ML SUBCUTANEOUS SYRINGE: 84 days supply | Qty: 0.5 | Fill #1

## 2021-10-17 DIAGNOSIS — R7989 Other specified abnormal findings of blood chemistry: Principal | ICD-10-CM

## 2021-10-17 MED ORDER — TESTOSTERONE 20.25 MG/1.25 GRAM PER PUMP ACT.(1.62 %) TRANSDERMAL GEL
0 refills | 0 days
Start: 2021-10-17 — End: ?

## 2021-10-18 MED ORDER — TESTOSTERONE 20.25 MG/1.25 GRAM PER PUMP ACT.(1.62 %) TRANSDERMAL GEL
0 refills | 0 days | Status: CP
Start: 2021-10-18 — End: ?

## 2021-11-23 ENCOUNTER — Ambulatory Visit: Admit: 2021-11-23 | Discharge: 2021-11-24 | Payer: MEDICARE

## 2021-11-23 DIAGNOSIS — R519 Nonintractable headache, unspecified chronicity pattern, unspecified headache type: Principal | ICD-10-CM

## 2021-11-23 DIAGNOSIS — R0981 Nasal congestion: Principal | ICD-10-CM

## 2021-11-23 NOTE — Unmapped (Signed)
Patient called to request antibiotic for sinus infection after jumping in a swimming pool. Patient was offered an appt for today and declined. Patient states he needs a antibiotic today and can not come to a appt.  Please call to assist

## 2021-11-23 NOTE — Unmapped (Signed)
Assessment and Plan:     Cory Bentley was seen today for congestion.    Diagnoses and all orders for this visit:    Reports that 3 days ago he went swimming in a salt water pool and when he divan he got water up his nose.  Since that time he has been experiencing some nasal congestion/runny nose/left-sided headache.  He denies postnasal drip, sore throat, ear pain, sinus pressure, sinus pain, fever  He has not taken anything for his symptoms.    Exam is unremarkable today.    Patient is requesting antibiotic for sinus infection.  Discussed with patient that symptoms are not consistent with sinus infection nor would be appropriate to treat for sinus infection with the symptoms and only 3 days worth of symptoms.    I have encouraged patient to use Flonase 1 spray each nare twice a day for the next week as well as take Zyrtec or Claritin daily for the next week.  He will let me know via MyChart if symptoms or not improved.    Nasal congestion    Nonintractable headache, unspecified chronicity pattern, unspecified headache type         No follow-ups on file.    Subjective:     HPI: Cory Bentley is a 61 y.o. male here for   Chief Complaint   Patient presents with    Congestion     Onset 11/21/21   :    Cory Bentley was seen today for congestion.    Diagnoses and all orders for this visit:    Reports that 3 days ago he went swimming in a salt water pool and when he divan he got water up his nose.  Since that time he has been experiencing some nasal congestion/runny nose/left-sided headache.  He denies postnasal drip, sore throat, ear pain, sinus pressure, sinus pain, fever  He has not taken anything for his symptoms.    Exam is unremarkable today.    Patient is requesting antibiotic for sinus infection.  Discussed with patient that symptoms are not consistent with sinus infection nor would be appropriate to treat for sinus infection with the symptoms and only 3 days worth of symptoms.    I have encouraged patient to use Flonase 1 spray each nare twice a day for the next week as well as take Zyrtec or Claritin daily for the next week.  He will let me know via MyChart if symptoms or not improved.    Nasal congestion    Nonintractable headache, unspecified chronicity pattern, unspecified headache type            ROS:   Review of Systems   Constitutional:  Negative for activity change, appetite change, fatigue, fever and unexpected weight change.   HENT:  Positive for congestion and rhinorrhea. Negative for ear pain, facial swelling, nosebleeds, postnasal drip, sinus pressure, sinus pain, sneezing, sore throat, trouble swallowing and voice change.    Respiratory:  Negative for chest tightness and shortness of breath.    Cardiovascular:  Negative for chest pain and palpitations.   Gastrointestinal:  Negative for abdominal pain, diarrhea, nausea and vomiting.   Musculoskeletal:  Negative for myalgias.   Skin:  Negative for rash.   Neurological:  Positive for headaches. Negative for dizziness and light-headedness.   Psychiatric/Behavioral:  Negative for sleep disturbance. The patient is not nervous/anxious.         Review of systems negative unless otherwise noted as per HPI    Objective:  Visit Vitals  BP 128/72   Pulse 84   Temp 36.4 ??C (97.5 ??F)   Wt 91.7 kg (202 lb 1.6 oz)   SpO2 98%   BMI 30.73 kg/m??     There were no vitals filed for this visit.     Physical Exam  Vitals reviewed.   Constitutional:       General: He is not in acute distress.     Appearance: He is well-developed and well-groomed. He is not ill-appearing, toxic-appearing or diaphoretic.      Interventions: Face mask in place.   HENT:      Head: Normocephalic and atraumatic.      Right Ear: External ear normal. No middle ear effusion. Tympanic membrane is not perforated, erythematous or bulging.      Left Ear: External ear normal.  No middle ear effusion. Tympanic membrane is not perforated, erythematous or bulging.      Nose: Nose normal. No mucosal edema or rhinorrhea.      Mouth/Throat:      Mouth: Mucous membranes are not pale, not dry and not cyanotic. No oral lesions.      Pharynx: No oropharyngeal exudate, posterior oropharyngeal erythema or uvula swelling.      Tonsils: No tonsillar abscesses.   Eyes:      General: No scleral icterus.     Extraocular Movements: Extraocular movements intact.      Conjunctiva/sclera: Conjunctivae normal.      Pupils: Pupils are equal, round, and reactive to light.   Cardiovascular:      Rate and Rhythm: Normal rate and regular rhythm.      Heart sounds: Normal heart sounds, S1 normal and S2 normal. No murmur heard.  Pulmonary:      Effort: Pulmonary effort is normal. No accessory muscle usage or respiratory distress.      Breath sounds: Normal breath sounds. No stridor. No wheezing or rales.      Comments: Able to speak in complete sentences.  Musculoskeletal:      Cervical back: Normal range of motion and neck supple.   Lymphadenopathy:      Head:      Right side of head: No submandibular, tonsillar, preauricular or posterior auricular adenopathy.      Left side of head: No submandibular, tonsillar, preauricular or posterior auricular adenopathy.      Cervical: No cervical adenopathy.   Skin:     General: Skin is warm and dry.      Coloration: Skin is not pale.      Findings: No erythema or rash.   Neurological:      General: No focal deficit present.      Mental Status: He is alert and oriented to person, place, and time.   Psychiatric:         Mood and Affect: Mood is anxious.         Behavior: Behavior is cooperative.          PCMH:     Medication adherence and barriers to the treatment plan have been addressed. Opportunities to optimize healthy behaviors have been discussed. Patient / caregiver voiced understanding.

## 2021-11-23 NOTE — Unmapped (Signed)
Called patient to advise [name] will not give an antibiotic without an appointment.  Patient stated he has an appointment at 1pm if we want him blowing his nose all over our office then he will come in. This nurse tried to explain that patient had spoken to our call center and patient had declined the 1 pm appointment, this nurse was not sure it was still available.  At this point patient began yelling and verbally abusive towards this nurse, threatened to contact the head of [place], who he knows personallybecause we were denying him care.  Patient continued to yell and talk over this nurse.  This nurse advised she would not continue the conversation if he continued to behave this way. Advised patient this nurse would check with the front and see if we could still see him.  Placed patient on hold and had front desk check appointment still available, scheduled appointment. Patient aware

## 2021-11-23 NOTE — Unmapped (Signed)
Patient called. He says he has a sinus infection. He jumped into the swimming pool and got a lot of water up his nose. He has congestion in his nose and is blowing his nose a lot. He would like a Rx for antibiotics to clear it up. He does not want to come into the office since he is blowing his nose all the time.    [id number] - [place], [place] - [address] AT [facility] [address] [facility] [address]  [address] [place], [place] [address]  Phone: [phone]  Fax: [phone]     Please call patient to assist.

## 2021-12-06 ENCOUNTER — Ambulatory Visit: Admit: 2021-12-06 | Discharge: 2021-12-07 | Payer: MEDICARE

## 2021-12-06 DIAGNOSIS — E11628 Type 2 diabetes mellitus with other skin complications: Principal | ICD-10-CM

## 2021-12-06 DIAGNOSIS — Z794 Long term (current) use of insulin: Principal | ICD-10-CM

## 2021-12-06 DIAGNOSIS — I10 Essential (primary) hypertension: Principal | ICD-10-CM

## 2021-12-06 DIAGNOSIS — E1142 Type 2 diabetes mellitus with diabetic polyneuropathy: Principal | ICD-10-CM

## 2021-12-06 DIAGNOSIS — M65351 Trigger finger, right little finger: Principal | ICD-10-CM

## 2021-12-06 DIAGNOSIS — E785 Hyperlipidemia, unspecified: Principal | ICD-10-CM

## 2021-12-06 DIAGNOSIS — E1169 Type 2 diabetes mellitus with other specified complication: Principal | ICD-10-CM

## 2021-12-06 DIAGNOSIS — M79645 Pain in left finger(s): Principal | ICD-10-CM

## 2021-12-06 LAB — HEMOGLOBIN A1C
ESTIMATED AVERAGE GLUCOSE: 163 mg/dL
HEMOGLOBIN A1C: 7.3 % — ABNORMAL HIGH (ref 4.8–5.6)

## 2021-12-06 NOTE — Unmapped (Signed)
Assessment and Plan:     Cory Bentley was seen today for follow-up.    Diagnoses and all orders for this visit:    Type 2 diabetes mellitus with other skin complication, with long-term current use of insulin (CMS-HCC)    Patient brings with him his glucometer today.  His average 7-day reading is 169.  He is currently taking Jardiance 20 mg daily, 52 units of Levemir daily, and 8-10 units of NovoLog 3 times a day.  Reports that if his blood sugar is below 140 that he takes no insulin at all.  His last A1c 07/24/2021 was 7.5.  Recheck A1c today.    -     Hemoglobin A1c    Diabetic polyneuropathy associated with type 2 diabetes mellitus (CMS-HCC)    Patient is currently taking gabapentin 800 mg 3 times a day.  However, he reports that sometimes he will take 800 mg in the morning and midday but takes 1600 mg at bedtime.  We will continue with patient taking gabapentin 800 mg 3 times a day.  If he requires consistent elevation at bedtime dosing we will discuss at future appointment.    Hyperlipidemia associated with type 2 diabetes mellitus (CMS-HCC)    Patient is currently on atorvastatin 40 mg daily.  He is not following any sort of dietary or exercise regimen to improve.  However, last lipid panel was great.  Triglycerides 73, cholesterol 119, HDL 44, LDL 60.  Pt denies N/V/D/abdominal pain associated with medication.   Continue with this regimen.    Primary hypertension    Patient's BP today is 152/90 with recheck 160/100.  This is not at goal.  Reports that he feels like his blood pressure is up because he was upset earlier.  However, his episode of being upset was over 4 hours today.  Today he does not wish to discuss adjusting his medication either.  Discussed with him that I feel like a medication adjustment is needed at this time.  He would like to wait until his next appointment.  Pt denies CP, SHOB, DOE, palpitations, dizziness, HA's, vision changes   Patient is currently on lisinopril 40 mg daily.  If blood pressure remains elevated will add Aldactone to her regimen.    Trigger little finger of right hand    Reports that he was hospitalized sometime in 2017 in the [facility] system and had surgery to his right pinky.  The records from this surgery or prior to epic, therefore I am unable to view them.  He reports that he did not have rehab and he feels that this is the reason that his right little finger is drawn into the palm of his hand.  Patient has decided at this time that he would like to have this surgically corrected.  Patient requests referral to hand specialist.  This is placed today.    -     Ambulatory referral to Orthopedic Surgery; Future    Pain of left thumb    Patient reports that an unknown number of years ago he had an infection in his left thumb.  He reports that he went to the emergency room and was made to wait for several days prior to a physician looking at it.  He reports that due to the infection that there was tendon damage, thus leaving his thumb deformed.  He would like to have this surgically corrected at this time.  I have not able to find any records within the epic system to  support this claim.  Referral to hand specialist placed today.    -     Ambulatory referral to Orthopedic Surgery; Future     I personally spent 30 minutes face-to-face and non-face-to-face in the care of this patient, which includes all pre, intra, and post visit time on the date of service.  All documented time was specific to the E/M visit and does not include any procedures that may have been performed.    Return in about 3 months (around 03/08/2022) for Recheck.    Subjective:     HPI: Cory Bentley is a 61 y.o. male here for   Chief Complaint   Patient presents with    Follow-up     DM    :    Cory Bentley was seen today for follow-up.    Diagnoses and all orders for this visit:    Type 2 diabetes mellitus with other skin complication, with long-term current use of insulin (CMS-HCC)    Patient brings with him his glucometer today.  His average 7-day reading is 169.  He is currently taking Jardiance 20 mg daily, 52 units of Levemir daily, and 8-10 units of NovoLog 3 times a day.  Reports that if his blood sugar is below 140 that he takes no insulin at all.  His last A1c 07/24/2021 was 7.5.  Recheck A1c today.    -     Hemoglobin A1c    Diabetic polyneuropathy associated with type 2 diabetes mellitus (CMS-HCC)    Patient is currently taking gabapentin 800 mg 3 times a day.  However, he reports that sometimes he will take 800 mg in the morning and midday but takes 1600 mg at bedtime.  We will continue with patient taking gabapentin 800 mg 3 times a day.  If he requires consistent elevation at bedtime dosing we will discuss at future appointment.    Hyperlipidemia associated with type 2 diabetes mellitus (CMS-HCC)    Patient is currently on atorvastatin 40 mg daily.  He is not following any sort of dietary or exercise regimen to improve.  However, last lipid panel was great.  Triglycerides 73, cholesterol 119, HDL 44, LDL 60.  Pt denies N/V/D/abdominal pain associated with medication.   Continue with this regimen.    Primary hypertension    Patient's BP today is 152/90 with recheck 160/100.  This is not at goal.  Reports that he feels like his blood pressure is up because he was upset earlier.  However, his episode of being upset was over 4 hours today.  Today he does not wish to discuss adjusting his medication either.  Discussed with him that I feel like a medication adjustment is needed at this time.  He would like to wait until his next appointment.  Pt denies CP, SHOB, DOE, palpitations, dizziness, HA's, vision changes   Patient is currently on lisinopril 40 mg daily.  If blood pressure remains elevated will add Aldactone to her regimen.    Trigger little finger of right hand    Reports that he was hospitalized sometime in 2017 in the [facility] system and had surgery to his right pinky.  The records from this surgery or prior to epic, therefore I am unable to view them.  He reports that he did not have rehab and he feels that this is the reason that his right little finger is drawn into the palm of his hand.  Patient has decided at this time that he would like to have  this surgically corrected.  Patient requests referral to hand specialist.  This is placed today.    -     Ambulatory referral to Orthopedic Surgery; Future    Pain of left thumb    Patient reports that an unknown number of years ago he had an infection in his left thumb.  He reports that he went to the emergency room and was made to wait for several days prior to a physician looking at it.  He reports that due to the infection that there was tendon damage, thus leaving his thumb deformed.  He would like to have this surgically corrected at this time.  I have not able to find any records within the epic system to support this claim.  Referral to hand specialist placed today.    -     Ambulatory referral to Orthopedic Surgery; Future            ROS:   Review of Systems   Constitutional:  Negative for activity change, appetite change, fatigue and unexpected weight change.   Respiratory:  Negative for chest tightness and shortness of breath.    Cardiovascular:  Negative for chest pain and palpitations.   Gastrointestinal:  Negative for abdominal pain, constipation, diarrhea, nausea and vomiting.   Musculoskeletal:         Right pinky trigger finger  Left thumb deformity   Skin:  Negative for rash.   Neurological:  Negative for dizziness, light-headedness and headaches.   Psychiatric/Behavioral:  Negative for sleep disturbance. The patient is not nervous/anxious.         Review of systems negative unless otherwise noted as per HPI    Objective:     Visit Vitals  BP 160/100   Pulse 82   Temp 36.9 ??C (98.4 ??F) (Temporal)   Ht 172.7 cm (5' 8)   Wt 93.9 kg (207 lb)   SpO2 96%   BMI 31.47 kg/m??          12/06/21 1300   PainSc: 9         Physical Exam  Constitutional:       General: He is not in acute distress.     Appearance: Normal appearance. He is well-developed and well-groomed. He is not ill-appearing.   HENT:      Head: Normocephalic and atraumatic.      Mouth/Throat:      Lips: Pink.      Mouth: Mucous membranes are moist.   Eyes:      Conjunctiva/sclera: Conjunctivae normal.   Cardiovascular:      Rate and Rhythm: Normal rate and regular rhythm.      Heart sounds: Normal heart sounds, S1 normal and S2 normal.   Pulmonary:      Effort: Pulmonary effort is normal.      Breath sounds: Normal breath sounds and air entry.   Abdominal:      General: Bowel sounds are normal.      Palpations: Abdomen is soft.      Tenderness: There is no abdominal tenderness.   Musculoskeletal:      Comments: Right pinky trigger finger  Left thumb deformity   Skin:     General: Skin is warm and dry.   Neurological:      Mental Status: He is alert and oriented to person, place, and time.   Psychiatric:         Attention and Perception: Attention and perception normal.  Mood and Affect: Mood and affect normal.         Speech: Speech normal.         Behavior: Behavior normal. Behavior is cooperative.         Thought Content: Thought content normal.          [facility]:     Medication adherence and barriers to the treatment plan have been addressed. Opportunities to optimize healthy behaviors have been discussed. Patient / caregiver voiced understanding.

## 2021-12-07 NOTE — Unmapped (Signed)
[facility] Refill Coordination Note    Specialty Medication(s) to be Shipped:   Inflammatory Disorders: Stelara    Other medication(s) to be shipped: No additional medications requested for fill at this time     Cory Bentley, DOB: 08/19/1960  Phone: [phone] (home) [phone] (work)      All above HIPAA information was verified with patient.     Was a Nurse, learning disability used for this call? No    Completed refill call assessment today to schedule patient's medication shipment from the [facility] [phone]).  All relevant notes have been reviewed.     Specialty medication(s) and dose(s) confirmed: Regimen is correct and unchanged.   Changes to medications: Cory Bentley reports no changes at this time.  Changes to insurance: No  New side effects reported not previously addressed with a pharmacist or physician: None reported  Questions for the pharmacist: No    Confirmed patient received a Conservation officer, historic buildings and a Surveyor, mining with first shipment. The patient will receive a drug information handout for each medication shipped and additional FDA Medication Guides as required.       DISEASE/MEDICATION-SPECIFIC INFORMATION        For patients on injectable medications: Patient currently has 0 doses left.  Next injection is scheduled for 12/26/21 .    SPECIALTY MEDICATION ADHERENCE     Medication Adherence    Patient reported X missed doses in the last month: 0  Specialty Medication: Stelara  Patient is on additional specialty medications: No  Patient is on more than two specialty medications: No  Any gaps in refill history greater than 2 weeks in the last 3 months: no  Demonstrates understanding of importance of adherence: yes                          Were doses missed due to medication being on hold? No    Stelara 45/0.5 mg/ml: 0 days of medicine on hand       REFERRAL TO PHARMACIST     Referral to the pharmacist: Not needed      [facility]     Shipping address confirmed in Epic.     Delivery Scheduled: Yes, Expected medication delivery date: 12/20/21 .     Medication will be delivered via Same Day Courier to the prescription address in Epic WAM.    Cory Bentley   [facility] Specialty Technician

## 2021-12-11 NOTE — Unmapped (Signed)
Good afternoon Cory Bentley     I have reviewed your A1c and it is 7.3  This is slightly improved from previous of 7.5 but still not at goal which would be below 7.0  I recommend that you try to increase your levemir dosing to 54 units daily and adjust novolog to 10-12 units three times a day with meals   Please continue monitoring your intake of sugary foods/drinks and carbohydrates, increase water intake and strive to increase exercise/movement daily    [name]

## 2021-12-18 NOTE — Unmapped (Signed)
Please notify pt of lab result note   Thanks  [name]

## 2021-12-19 ENCOUNTER — Ambulatory Visit: Admit: 2021-12-19 | Payer: MEDICARE

## 2021-12-20 NOTE — Unmapped (Signed)
Cory Bentley 's Stelara shipment will be delayed as a result of insufficient inventory of the drug.     I have reached out to the patient  and communicated the delay. We will reschedule the medication for the delivery date that the patient agreed upon.  We have confirmed the delivery date as 12/21/21, via same day courier.

## 2021-12-21 MED FILL — STELARA 45 MG/0.5 ML SUBCUTANEOUS SYRINGE: 84 days supply | Qty: 0.5 | Fill #2

## 2021-12-24 NOTE — Unmapped (Signed)
Please notify pt of lab results note   Cory Bentley

## 2021-12-31 NOTE — Unmapped (Signed)
Please notify pt of lab result message  Cory Bentley

## 2022-01-01 ENCOUNTER — Ambulatory Visit: Admit: 2022-01-01 | Discharge: 2022-01-01 | Payer: MEDICARE

## 2022-01-01 DIAGNOSIS — D492 Neoplasm of unspecified behavior of bone, soft tissue, and skin: Principal | ICD-10-CM

## 2022-01-01 DIAGNOSIS — L409 Psoriasis, unspecified: Principal | ICD-10-CM

## 2022-01-01 DIAGNOSIS — Z79899 Other long term (current) drug therapy: Principal | ICD-10-CM

## 2022-01-01 DIAGNOSIS — L720 Epidermal cyst: Principal | ICD-10-CM

## 2022-01-01 MED ORDER — USTEKINUMAB 45 MG/0.5 ML SUBCUTANEOUS SYRINGE
SUBCUTANEOUS | 3 refills | 0.00000 days | Status: CP
Start: 2022-01-01 — End: ?
  Filled 2022-03-15: qty 0.5, 84d supply, fill #0

## 2022-01-01 NOTE — Unmapped (Signed)
[facility] releases most results to you as soon as they are available. Therefore, you may see some results before we do. Please give [place] 3 business days to review the tests and contact you by phone or through MyChart. If you are concerned that some results may be upsetting or confusing, you may wish to wait until we contact you before looking at the report in MyChart.   If you have an urgent question, you can send [place] a message or call our clinic. Otherwise, we prefer that you wait 3 business days for [place] to contact you.    [facility] Dermatology Clinical Team    Punch biopsy  Punch biopsy involves numbing a small area of your skin, then obtaining a sample to help [place] make a proper diagnosis of your skin condition. The biopsy site is typically closed with one to 3 small stitches to help the site heal. Biopsy results will usually return in 7-14 days.    To care for the area: Leave the bandage in place until the morning after your procedure is performed. On a daily basis, carefully remove the bandage, then shower or wash as usual. Allow water to run over the site. Please do not scrub. Carefully dry the area, then apply ointment (some people develop an allergy to Neosporin, so we recommend Vaseline or Aquaphor). Cover the site with a fresh bandage. Should any bleeding occur, apply firm pressure for 15 minutes. The treated site will heal best if  a scab never forms (the wound heals by new skin cells traveling from the outside toward the middle-their journey is easier if no scab stands in their way).    Long-term care: the site will be more sensitive than your surrounding skin. Keep it covered, and remember to apply sunscreen every day to all your exposed skin. A scar may remain which is lighter or pinker than your normal skin. Your body will continue to improve your scar for up to one year.    Infection following this procedure is rare. However, if you are worried about the appearance of your site, contact your doctor. Complete healing of the site may take up to one month. We have a physician on call at all times. If you have any concerns about the site, please call our clinic at [phone]    Your stitches should be removed in 1 to 2 weeks. Typically it is about 1 week for the face and 2 weeks for other areas. Please follow the time frame given to you at your appointment.

## 2022-01-01 NOTE — Unmapped (Signed)
Dermatology Note     Assessment and Plan:      Psoriasis, Chronic problem- improved, but not at treatment goal  - Continue ustekinumab (STELARA) 45 mg/0.5 mL Syrg syringe; Inject the contents of 1 syringe (45 mg) under the skin every 12 weeks  - Tolerating Stelara well  - Continue betamethasone valerate (VALISONE) 0.1 % ointment; Apply to thick areas of psoriasis twice daily as needed until smooth. Avoid face and skin folds. Does not require refills today.  - Continue hydrocortisone 2.5 % ointment; Apply to psoriasis on ears up to twice daily as needed until smooth. Does not require refills today.  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 Time Out verification of patient, procedure, and site was performed. When applicable, safety precautions based on patient's medical history or medication use and review of relevant images and results was also performed as part of the Time Out. The area was prepped with alcohol and anesthetized with lidocaine 2% with epinephrine. Biopsy was performed using a punch technique with a 6 mm punch. Hemostasis and closure were achieved in the usual fashion with pressure and 4-0 nylon suture. Bandage was applied and instructions were provided. We will contact the patient with results when available. Patient agrees to be notified of results by MyChart - even if needs further management.   A) Location: L upper chest; Ddx: dermatofibroma  Suture removal plan: 2 weeks at home, suture removal kit provided    The patient was advised to call for an appointment should any new, changing, or symptomatic lesions develop.     RTC: Return in about 6 months (around 07/02/2022) for Psoriasis. or sooner as needed   _________________________________________________________________      Chief Complaint     Chief Complaint   Patient presents with    Psoriasis     Pt here for 67m follow up Psoriasis, skin doing good, no new concerns        HPI     Cory Bentley is a 61 y.o. male who presents as a returning patient (last seen 06/19/2021) to Dermatology for follow up of psoriasis.    Today regarding psoriasis patient reports:  - psoriasis is doing well  - has flaring on his elbows  - using Stelara injections every 12 weeks, last injection was 3 weeks ago  - has been on Stelara for a little over one year, tolerating well  - uses betamethasone valerate (VALISONE) 0.1 % ointment on body around once per month. Does not need refills today.  - uses hydrocortisone 2.5 % ointment around ears and face around once per  month. Does not need refills today  - endorses joint pains (ankles pop when walking, endorses morning stiffness of hands and feet for approximately 10-15 minutes), has informed PCP, who recommended a hand and foot specialist    Today regarding EIC:  - cyst has never been inflamed and is not bothersome    Patient would also like to discuss a hyperpigmented nodule on his L upper chest, which has been there for approximately 5 years. It is not bothersome.    The patient denies any other new or changing lesions or areas of concern.     Pertinent Past Medical History     Problem List       Psoriasis - Primary    Relevant Medications    ustekinumab (STELARA) 45 mg/0.5 mL Syrg syringe     Past Medical History, Family History, Social History, Medication List, Allergies, and Problem List were reviewed in the rooming section of Epic.     ROS: Other than symptoms mentioned in the HPI, no fevers, chills, or other skin complaints    Physical Examination     GENERAL: Well-appearing male in no acute distress, resting comfortably.  NEURO: Alert and oriented, answers questions appropriately  SKIN: Examination of the ears, face, chest, back, bilateral upper extremities, bilateral lower extremities, hands, feet, and fingernails was performed    - Psoriatic plaques on bilateral extensor surfaces of elbows  - A firm, skin-colored, subcutaneous nodule that is freely movable and has a central punctum located on the R temple  - Biopsy A: Firm dermal nodule with a positive dimple sign located on the L upper chest              All areas not commented on are within normal limits or unremarkable    (Approved Template 12/27/2019)

## 2022-01-02 LAB — TB NIL: TB NIL VALUE: 0.03

## 2022-01-02 LAB — QUANTIFERON TB GOLD PLUS
QUANTIFERON ANTIGEN 1 MINUS NIL: 0.01 [IU]/mL
QUANTIFERON ANTIGEN 2 MINUS NIL: 0.02 [IU]/mL
QUANTIFERON MITOGEN: 9.97 [IU]/mL
QUANTIFERON TB GOLD PLUS: NEGATIVE
QUANTIFERON TB NIL VALUE: 0.03 [IU]/mL

## 2022-01-02 LAB — TB AG2: TB AG2 VALUE: 0.05

## 2022-01-02 LAB — TB MITOGEN: TB MITOGEN VALUE: 10

## 2022-01-02 LAB — TB AG1: TB AG1 VALUE: 0.04

## 2022-02-01 NOTE — Unmapped (Signed)
Abstraction Result Flowsheet Data    This patient's last AWV date: [facility] Last Medicare Wellness Visit Date: 11/30/2020  This patients last WCC/CPE date: : Not Found      Reason for Encounter  Reason for Encounter: Outreach  Primary Reason for Outreach: AWV  Text Message: No  MyChart Message: No  Outreach Call Outcome: Left message

## 2022-02-12 DIAGNOSIS — R7989 Other specified abnormal findings of blood chemistry: Principal | ICD-10-CM

## 2022-02-12 MED ORDER — TESTOSTERONE 20.25 MG/1.25 GRAM PER PUMP ACT.(1.62 %) TRANSDERMAL GEL
Freq: Every day | TRANSDERMAL | 0 refills | 90 days
Start: 2022-02-12 — End: 2023-02-12

## 2022-02-12 NOTE — Unmapped (Signed)
Patient is requesting the following refill  Requested Prescriptions      No prescriptions requested or ordered in this encounter       Recent Visits  Date Type Provider Dept   12/06/21 Office Visit Coralee North, Magnus Ivan, FNP [facility] [address] [facility]   11/23/21 Office Visit Clapp, Magnus Ivan, FNP [facility] [address] [facility]   09/05/21 Office Visit Clapp, Magnus Ivan, FNP [facility] [address] [facility]   03/30/21 Office Visit Coralee North, Magnus Ivan, FNP [facility]   03/02/21 Office Visit Clapp, Magnus Ivan, FNP [facility]   Showing recent visits within past 365 days with a meds authorizing provider and meeting all other requirements  Future Appointments  Date Type Provider Dept   03/13/22 Appointment Coralee North, Magnus Ivan, FNP [facility] [address] [facility]   Showing future appointments within next 365 days with a meds authorizing provider and meeting all other requirements

## 2022-02-14 MED ORDER — TESTOSTERONE 20.25 MG/1.25 GRAM PER PUMP ACT.(1.62 %) TRANSDERMAL GEL
Freq: Every day | TRANSDERMAL | 0 refills | 90 days | Status: CP
Start: 2022-02-14 — End: 2023-02-14

## 2022-02-20 ENCOUNTER — Ambulatory Visit: Admit: 2022-02-20 | Payer: MEDICARE

## 2022-02-20 DIAGNOSIS — Z794 Long term (current) use of insulin: Principal | ICD-10-CM

## 2022-02-20 DIAGNOSIS — E1142 Type 2 diabetes mellitus with diabetic polyneuropathy: Principal | ICD-10-CM

## 2022-02-20 DIAGNOSIS — E11628 Type 2 diabetes mellitus with other skin complications: Principal | ICD-10-CM

## 2022-02-20 MED ORDER — GABAPENTIN 800 MG TABLET
ORAL_TABLET | Freq: Three times a day (TID) | ORAL | 1 refills | 90 days | Status: CP
Start: 2022-02-20 — End: 2023-02-20

## 2022-02-20 MED ORDER — LEVEMIR FLEXPEN 100 UNIT/ML (3 ML) SOLUTION SUBCUTANEOUS INSULIN PEN
Freq: Every day | SUBCUTANEOUS | 1 refills | 86 days | Status: CP
Start: 2022-02-20 — End: 2023-02-20

## 2022-02-26 NOTE — Unmapped (Unsigned)
PCP:  Cory Portland, FNP    02/26/2022  [id number]  [facility] [place]  [address]  Cory Bentley  [place] [address]  Dept: [phone]  Loc: [phone]    Chief Complaint: Followup of proteinuria    HPI:  [name] is a 61 y.o.  male who presents for followup of proteinuria. 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 His diabetes was poorly controlled for many years, with a documented a1c on his chart of 14%. He notes that this was fairly typical for him during that time period and many times he would max out his glucometer. His glucose is much better controlled now, but there is still room for improvement.     FH is positive for a cousin who required kidney transplant x2. The cause of renal disease is unknown. FH is also positive for DM2.     Was in ED 07/25/2021 for cellulitis to his right great toe. His SBP was noted to be 200 and his lisinopril was increased to 30mg  and he was started on nifedipine 30mg . He is not taking the nifedipine at this time as it made him feel strange with some parathesias to his neck.     ROS:   CONSTITUTIONAL: denies fevers or chills, denies unintentional weight loss   CARDIOVASCULAR: denies chest pain, denies dyspnea on exertion, denies leg edema  GASTROINTESTINAL: denies nausea, denies vomiting, denies anorexia  GENITOURINARY: denies dysuria, denies hematuria, denies decreased urinary stream  All 10 systems reviewed and are negative except as listed above.    PAST MEDICAL HISTORY:  Past Medical History:   Diagnosis Date    Cellulitis of left foot 07/24/2021    Diabetes mellitus (CMS-HCC)     Hypertension     Psoriasis     Substance abuse (CMS-HCC)        ALLERGIES  Patient has no known allergies.    FAMILY HISTORY  Family History   Problem Relation Age of Onset Substance abuse (CMS-HCC)        ALLERGIES  Patient has no known allergies.    FAMILY HISTORY  Family History   Problem Relation Age of Onset    Diabetes Mother     Diabetes type II Mother     Ovarian cancer Mother     Diabetes Father     Diabetes type II Father     Diabetes type II Sister     Diabetes type II Brother     Diabetes type II Maternal Grandmother     Diabetes type II Maternal Grandfather     Diabetes type II Paternal Grandmother     Diabetes type II Paternal Grandfather     Melanoma Neg Hx     Basal cell carcinoma Neg Hx     Squamous cell carcinoma Neg Hx        SOCIAL HISTORY   reports that he has never smoked. He has never been exposed to tobacco smoke. He has never used smokeless tobacco. He reports current alcohol use. He reports current drug use. Frequency: 7.00 times per week. Drug: Marijuana.    MEDICATIONS:  Current Outpatient Medications   Medication Sig Dispense Refill    alcohol swabs (ALCOHOL PADS) PadM Apply 1 swab topically four (4) times a day. 400 each 1    atorvastatin (  LIPITOR) 40 MG tablet Take 1 tablet (40 mg total) by mouth daily. 90 tablet 1    BD ULTRA-FINE MINI PEN NEEDLE 31 gauge x 3/16 (5 mm) Ndle Inject 4 times daily 100 each 0    betamethasone valerate (VALISONE) 0.1 % ointment Apply to thick areas of psoriasis twice daily as needed until smooth. Avoid face and skin folds. 45 g 8    blood sugar diagnostic Strp Accu Check Aviva Strip, use 4 times per day for monitoring insulin dependant uncontrolled diabetes      blood-glucose meter kit Use as instructed 1 each 0    diaper,brief,adult,disposable Misc 1 each by Miscellaneous route two (2) times a day. 20 each 2    empagliflozin (JARDIANCE) 10 mg tablet Take 2 tablets (20 mg total) by mouth daily. 180 tablet 1    empty container Misc Use as directed to dispose of Stelara syringes. 1 each 2    flash glucose sensor (FREESTYLE LIBRE 14 DAY SENSOR) kit Apply new sensor every 14 days and use as directed to monitor and check blood hydrocortisone 2.5 % ointment Apply to psoriasis in ears up to twice daily as needed until smooth. 30 g 4    insulin detemir U-100 (LEVEMIR FLEXPEN) 100 unit/mL (3 mL) injection pen Inject 0.52 mL (52 Units total) under the skin daily. 45 mL 1    lisinopriL (PRINIVIL,ZESTRIL) 40 MG tablet Take 1 tablet (40 mg total) by mouth daily. 90 tablet 3    NOVOLOG FLEXPEN U-100 INSULIN 100 unit/mL (3 mL) injection pen Inject 0.1 mL (10 Units total) under the skin Three (3) times a day before meals. 27 mL 3    omeprazole (PRILOSEC) 20 MG capsule Take 1 capsule (20 mg total) by mouth daily. 90 capsule 3    testosterone 20.25 mg/1.25 gram (1.62 %) gel pump Place 2 sprays on the skin daily. 3.65 g 0    ustekinumab (STELARA) 45 mg/0.5 mL Syrg syringe Inject the contents of 1 syringe (45 mg total) under the skin every 12 weeks. 2 mL 3     No current facility-administered medications for this visit.       PHYSICAL EXAM:  Wt Readings from Last 3 Encounters:   12/06/21 93.9 kg (207 lb)   11/23/21 91.7 kg (202 lb 1.6 oz)   09/05/21 90.7 kg (200 lb)     Temp Readings from Last 3 Encounters:   12/06/21 36.9 ??C (98.4 ??F) (Temporal)   11/23/21 36.4 ??C (97.5 ??F)   09/05/21 36.1 ??C (97 ??F) (Temporal)     BP Readings from Last 5 Encounters:   12/06/21 160/100   11/23/21 128/72   09/05/21 128/90   08/15/21 180/103   07/25/21 161/80     Pulse Readings from Last 3 Encounters:   12/06/21 82   11/23/21 84   09/05/21 71       CONSTITUTIONAL: Alert,well appearing, no distress. Ambulates with a cane  HEENT: Moist mucous membranes, oropharynx clear without erythema or exudate  EYES: Extra ocular movements intact. Pupils reactive, sclerae anicteric.  NECK: Supple, no lymphadenopathy  CARDIOVASCULAR: Regular, normal S1/S2 heart sounds, no murmurs, no rubs.   PULM: Clear to auscultation bilaterally  GASTROINTESTINAL: Soft, active bowel sounds, nontender  EXTREMITIES: No lower extremity edema bilaterally.   SKIN: No rashes or lesions  NEUROLOGIC: No focal motor or sensory deficits    MEDICAL DECISION MAKING  Urinalysis performed in office revealed specific gravity 1.015 with pH of 6 and was negative for leukocyte esterase,  nitrite, urobilinogen, protein, blood, ketone, bilirubin or glucose.     Creatinine   Date Value Ref Range Status   07/25/2021 0.90 0.50 - 1.40 mg/dL Final   16/01/9603 5.40 0.50 - 1.40 mg/dL Final   98/02/9146 8.29 0.50 - 1.40 mg/dL Final   56/21/3086 5.78 0.60 - 1.10 mg/dL Final   46/96/2952 8.41 0.60 - 1.10 mg/dL Final   32/44/0102 7.25 0.60 - 1.10 mg/dL Final   36/64/4034 1.0 0.5 - 1.4 mg/dL Final   74/25/9563 0.9 0.5 - 1.4 mg/dL Final   87/56/4332 0.8 0.5 - 1.4 mg/dL Final   95/18/8416 1.1 0.5 - 1.4 mg/dL Final   60/63/0160 1.0 0.5 - 1.4 mg/dL Final   10/93/2355 1.2 0.5 - 1.4 mg/dL Final   73/22/0254 1.1 0.5 - 1.4 mg/dL Final   27/09/2374 1.0 0.5 - 1.4 mg/dL Final   28/31/5176 0.8 0.5 - 1.4 mg/dL Final   16/10/3708 0.9 0.5 - 1.4 mg/dL Final        Lab Results   Component Value Date    NA 140 07/25/2021    K 4.1 07/25/2021    CL 108 07/25/2021    CO2 25.0 07/25/2021    BUN 26 (H) 07/25/2021    CREATININE 0.90 07/25/2021    GLU 122 (H) 07/25/2021    CALCIUM 8.8 07/25/2021    ALBUMIN 3.8 07/23/2021    PHOS 3.0 07/20/2021       Lab Results   Component Value Date    CALCIUM 8.8 07/25/2021       Lab Results   Component Value Date    BILITOT 0.4 07/23/2021    BILIDIR 0.40 06/01/2018    PROT 6.2 07/23/2021    ALBUMIN 3.8 07/23/2021    ALT 17 07/23/2021    AST 23 07/23/2021    ALKPHOS 74 07/23/2021    GGT 1,007 (H) 05/28/2018     Lab Results   Component Value Date    INR 1.02 07/24/2021    APTT 27.3 07/24/2021       HGB   Date Value Ref Range Status   07/25/2021 12.8 (L) 14.0 - 18.0 g/dL Final   62/69/4854 62.7 (L) 14.0 - 18.0 g/dL Final   03/50/0938 18.2 (L) 14.0 - 18.0 g/dL Final   99/37/1696 78.9 12.9 - 16.5 g/dL Final   38/01/1750 02.5 (L) 14.0 - 18.0 g/dL Final   85/27/7824 23.5 (L) 14.0 - 18.0 g/dL Final   36/14/4315 40.0 (L) 14.0 - 18.0 g/dL Final   86/76/1950 93.2 (L) 14.0 - 18.0 g/dL Final   67/03/4579 99.8 (L) 14.0 - 18.0 g/dL Final   33/82/5053 97.6 (L) 14.0 - 18.0 g/dL Final   73/41/9379 02.4 (L) 14.0 - 18.0 g/dL Final   09/73/5329 92.4 (L) 14.0 - 18.0 g/dL Final   26/83/4196 22.2 (L) 14.0 - 18.0 g/dL Final   97/98/9211 94.1 (L) 14.0 - 18.0 g/dL Final   74/11/1446 18.5 (L) 14.0 - 18.0 g/dL Final   63/14/9702 63.7 (L) 14.0 - 18.0 g/dL Final   85/88/5027 74.1 (L) 14.0 - 18.0 g/dL Final        Lab Results   Component Value Date    WBC 8.6 07/25/2021    HGB 12.8 (L) 07/25/2021    HCT 40.8 (L) 07/25/2021    PLT 167 07/25/2021     Lab Results   Component Value Date    IRON 90 01/27/2020    TIBC 319 01/27/2020    FERRITIN 47.4 01/27/2020  Lab Results   Component Value Date    A1C 7.3 (H) 12/06/2021    A1C 7.5 (H) 07/24/2021    A1C 7.1 (A) 03/02/2021       Lab Results   Component Value Date    Color, UA Yellow 03/30/2019    Protein, Ur 86.6 07/20/2021    Glucose, UA Trace (A) 03/30/2019    Ketones, UA Negative 03/30/2019    Blood, UA Trace (A) 03/30/2019    Nitrite, UA Negative 03/30/2019   ]    Lab Results   Component Value Date    PROTEINUR 86.6 07/20/2021    CREATUR 52.7 09/05/2021     Lab Results   Component Value Date    PCRATIOUR 1.468 07/20/2021    PCRATIOUR 0.943 01/24/2021       Lab Results   Component Value Date    CHOL 119 09/05/2021    A1C 7.3 (H) 12/06/2021    ANA Negative 05/27/2018    HEPAIGG Nonreactive 04/22/2018    HEPBCAB Nonreactive 09/26/2020    HEPCAB Nonreactive 09/26/2020       ECG 12 Lead  Normal sinus rhythm  Anteroseptal infarct (cited on or before 26-Mar-2019)  Abnormal ECG  Confirmed by Arnette Norris ([id number]) on 07/27/2021 9:15:32 PM       ASSESSMENT/PLAN:    Mr.Cory Bentley is a 61 y.o. patient with a past medical history significant for DM2 w/ DR who is being seen for evaluation of proteinuria.     1. Chronic Kidney Disease Stage 1 - the patient has preserved renal function however he is developing proteinuria which is an lisinopril. If possible, we can try to increase the dose of lisinopril. Continue to check BMP and Urine pro/cr ratio before each visit. Will order these to be done at PCP office 11/29.     2. HTN - manual 142/82. Increase lisinopril to 40mg . Can add aldactone next if needed. Hold off on nifedipine as it may have caused parathesias     3. DM2 w/ DR - A1c has been as high as 14% in the past, but most recently is 7.3%. Ideally, I would like him to be <7%. Defer to PCP for management. Congratulated him on this improvement.     Mr.Cory Bentley will follow up in 6-12 month(s) with the following labs within 1 weeks of their next visit: RFP, Urine pro/creat ratio      Disclaimer: Much of the narrative of this dictation was acquired using speech recognition software. It is possible that some dictated speech was not transcribed accurately by this system nor detected in the proofing. Such errors are at times unavoidable.

## 2022-02-27 ENCOUNTER — Ambulatory Visit: Admit: 2022-02-27 | Discharge: 2022-02-28 | Payer: MEDICARE

## 2022-02-27 DIAGNOSIS — Z794 Long term (current) use of insulin: Principal | ICD-10-CM

## 2022-02-27 DIAGNOSIS — E11628 Type 2 diabetes mellitus with other skin complications: Principal | ICD-10-CM

## 2022-02-27 DIAGNOSIS — R809 Proteinuria, unspecified: Principal | ICD-10-CM

## 2022-02-27 DIAGNOSIS — I10 Essential (primary) hypertension: Principal | ICD-10-CM

## 2022-02-27 MED ORDER — HYDROCHLOROTHIAZIDE 12.5 MG CAPSULE
ORAL_CAPSULE | Freq: Every day | ORAL | 3 refills | 90 days | Status: CP
Start: 2022-02-27 — End: 2023-02-27

## 2022-02-28 DIAGNOSIS — I10 Essential (primary) hypertension: Principal | ICD-10-CM

## 2022-02-28 MED ORDER — LISINOPRIL 5 MG TABLET
ORAL_TABLET | Freq: Every day | ORAL | 1 refills | 90 days
Start: 2022-02-28 — End: 2023-02-28

## 2022-02-28 MED ORDER — LISINOPRIL 30 MG TABLET
ORAL_TABLET | Freq: Every day | ORAL | 1 refills | 90 days | Status: CP
Start: 2022-02-28 — End: 2023-02-28

## 2022-02-28 NOTE — Unmapped (Signed)
Pt is on lisinopril 30mg  per nephrology - that rf is sent in

## 2022-02-28 NOTE — Unmapped (Signed)
Patient is requesting the following refill  Requested Prescriptions     Pending Prescriptions Disp Refills    lisinopriL (PRINIVIL,ZESTRIL) 5 MG tablet 90 tablet 1     Sig: Take 1 tablet (5 mg total) by mouth daily.       Recent Visits  Date Type Provider Dept   12/06/21 Office Visit Coralee North, Magnus Ivan, FNP [facility] [address] [facility]   11/23/21 Office Visit Coralee North, Magnus Ivan, FNP [facility] [address] [facility]   09/05/21 Office Visit Clapp, Magnus Ivan, FNP [facility] [address] [facility]   03/30/21 Office Visit Coralee North, Magnus Ivan, FNP [facility]   03/02/21 Office Visit Coralee North, Magnus Ivan, FNP [facility]   Showing recent visits within past 365 days with a meds authorizing provider and meeting all other requirements  Future Appointments  Date Type Provider Dept   03/13/22 Appointment Coralee North, Magnus Ivan, FNP [facility] [address] [facility]   Showing future appointments within next 365 days with a meds authorizing provider and meeting all other requirements

## 2022-03-01 NOTE — Unmapped (Signed)
Opened in error. Due for clinical

## 2022-03-06 NOTE — Unmapped (Signed)
Cory Bentley reports his psoriasis is mostly clear, but he still has some small residual spots. He uses topical steroids for these PRN.    He reports some joint pain (doesn't remember if occurred before Stelara or not), and has an appt with Rheumatology in the spring.       Cory Bentley [facility] Clinical Assessment & Refill Coordination Note      Cory Bentley, DOB: 1960-12-23  Phone: [phone] (home) [phone] (work)    All above HIPAA information was verified with patient.     Was a Nurse, learning disability used for this call? No    Specialty Medication(s):   Inflammatory Disorders: Stelara     Current Outpatient Medications   Medication Sig Dispense Refill    alcohol swabs (ALCOHOL PADS) PadM Apply 1 swab topically four (4) times a day. 400 each 1    atorvastatin (LIPITOR) 40 MG tablet Take 1 tablet (40 mg total) by mouth daily. 90 tablet 1    betamethasone valerate (VALISONE) 0.1 % ointment Apply to thick areas of psoriasis twice daily as needed until smooth. Avoid face and skin folds. 45 g 8    blood sugar diagnostic Strp Accu Check Aviva Strip, use 4 times per day for monitoring insulin dependant uncontrolled diabetes      blood-glucose meter kit Use as instructed 1 each 0    diaper,brief,adult,disposable Misc 1 each by Miscellaneous route two (2) times a day. 20 each 2    DULoxetine (CYMBALTA) 20 MG capsule       empagliflozin (JARDIANCE) 10 mg tablet Take 2 tablets (20 mg total) by mouth daily. 180 tablet 1    empty container Misc Use as directed to dispose of Stelara syringes. 1 each 2    flash glucose sensor (FREESTYLE LIBRE 14 DAY SENSOR) kit Apply new sensor every 14 days and use as directed to monitor and check blood sugars 2 each 12    gabapentin (NEURONTIN) 800 MG tablet Take 1 tablet (800 mg total) by mouth Three (3) times a day. 270 tablet 1    hydroCHLOROthiazide (MICROZIDE) 12.5 mg capsule Take 1 capsule (12.5 mg total) by mouth daily. 90 capsule 3    hydrocortisone 2.5 % ointment Apply to psoriasis in ears up to twice daily as needed until smooth. 30 g 4    insulin detemir U-100 (LEVEMIR FLEXPEN) 100 unit/mL (3 mL) injection pen Inject 0.52 mL (52 Units total) under the skin daily. 45 mL 1    lisinopriL (PRINIVIL,ZESTRIL) 40 MG tablet Take 1 tablet (40 mg total) by mouth daily. 90 tablet 1    NOVOLOG FLEXPEN U-100 INSULIN 100 unit/mL (3 mL) injection pen Inject 0.1 mL (10 Units total) under the skin Three (3) times a day before meals. 27 mL 3    omeprazole (PRILOSEC) 20 MG capsule Take 1 capsule (20 mg total) by mouth daily. 90 capsule 3    testosterone 20.25 mg/1.25 gram (1.62 %) gel pump Place 2 sprays on the skin daily. 3.65 g 0    ustekinumab (STELARA) 45 mg/0.5 mL Syrg syringe Inject the contents of 1 syringe (45 mg total) under the skin every 12 weeks. 2 mL 3     No current facility-administered medications for this visit.        Changes to medications: [name] reports no changes at this time.    No Known Allergies    Changes to allergies: No    SPECIALTY MEDICATION ADHERENCE     Stelera 45 mg: 0 days of  medicine on hand   Medication Adherence    Patient reported X missed doses in the last month: 0  Specialty Medication: Stelara                         Specialty medication(s) dose(s) confirmed: Regimen is correct and unchanged.     Are there any concerns with adherence? No    Adherence counseling provided? Not needed    CLINICAL MANAGEMENT AND INTERVENTION      Clinical Benefit Assessment:    Do you feel the medicine is effective or helping your condition? Yes    Clinical Benefit counseling provided? Not needed    Adverse Effects Assessment:    Are you experiencing any side effects? No    Are you experiencing difficulty administering your medicine? No    Quality of Life Assessment:    Quality of Life    Rheumatology  Oncology  Dermatology  1. What impact has your specialty medication had on the symptoms of your skin condition (i.e. itchiness, soreness, stinging)?: Tremendous  2. What impact has your specialty medication had on your comfort level with your skin?: Tremendous  Cystic Fibrosis          How many days over the past month did your psoriasis  keep you from your normal activities? For example, brushing your teeth or getting up in the morning. 0    Have you discussed this with your provider? Not needed    Acute Infection Status:    Acute infections noted within Epic:  No active infections  Patient reported infection: None    Therapy Appropriateness:    Is therapy appropriate and patient progressing towards therapeutic goals? Yes, therapy is appropriate and should be continued    DISEASE/MEDICATION-SPECIFIC INFORMATION      For patients on injectable medications: Patient currently has 0 doses left.  Next injection is scheduled for 12/6.    Chronic Inflammatory Diseases: Have you experienced any flares in the last month? No  Has this been reported to your provider? No    PATIENT SPECIFIC NEEDS     Does the patient have any physical, cognitive, or cultural barriers? No    Is the patient high risk? No    Did the patient require a clinical intervention? No    Does the patient require physician intervention or other additional services (i.e., nutrition, smoking cessation, social work)? No    SOCIAL DETERMINANTS OF HEALTH     At the [facility], we have learned that life circumstances - like trouble affording food, housing, utilities, or transportation can affect the health of many of our patients.   That is why we wanted to ask: are you currently experiencing any life circumstances that are negatively impacting your health and/or quality of life? Patient declined to answer    Social Determinants of Health     Financial Resource Strain: Low Risk  (07/24/2021)    Overall Financial Resource Strain (CARDIA)     Difficulty of Paying Living Expenses: Not hard at all   Internet Connectivity: Possible Internet connectivity concern identified (09/05/2021)    Internet Connectivity     Do you have access to internet services: Yes     How do you connect to the internet: Not on file     Is your internet connection strong enough for you to watch video on your device without major problems?: Yes     Do you have enough data to get through  the month?: Yes     Does at least one of the devices have a camera that you can use for video chat?: Yes   Food Insecurity: No Food Insecurity (07/24/2021)    Hunger Vital Sign     Worried About Running Out of Food in the Last Year: Never true     Ran Out of Food in the Last Year: Never true   Tobacco Use: Low Risk  (03/13/2022)    Patient History     Smoking Tobacco Use: Never     Smokeless Tobacco Use: Never     Passive Exposure: Never   Housing/Utilities: Low Risk  (07/24/2021)    Housing/Utilities     Within the past 12 months, have you ever stayed: outside, in a car, in a tent, in an overnight shelter, or temporarily in someone else's home (i.e. couch-surfing)?: No     Are you worried about losing your housing?: No     Within the past 12 months, have you been unable to get utilities (heat, electricity) when it was really needed?: No   Alcohol Use: Not At Risk (11/30/2020)    Alcohol Use     How often do you have a drink containing alcohol?: 4+ times per week     How many drinks containing alcohol do you have on a typical day when you are drinking?: 1 - 2     How often do you have 5 or more drinks on one occasion?: Never   Transportation Needs: No Transportation Needs (07/24/2021)    PRAPARE - Transportation     Lack of Transportation (Medical): No     Lack of Transportation (Non-Medical): No   Substance Use: Low Risk  (11/30/2020)    Substance Use     Taken prescription drugs for non-medical reasons: Never     Taken illegal drugs: Never     Patient indicated they have taken drugs in the past year for non-medical reasons: Yes, [positive answer(s)]: Not on file   Health Literacy: Low Risk  (11/30/2020)    Health Literacy     : Never   Physical Activity: Insufficiently Active (11/30/2020)    Exercise Vital Sign     Days of Exercise per Week: 3 days     Minutes of Exercise per Session: 40 min   Interpersonal Safety: Not on file   Stress: No Stress Concern Present (11/30/2020)    [name] of Occupational Health - Occupational Stress Questionnaire     Feeling of Stress : Only a little   Intimate Partner Violence: Not At Risk (11/30/2020)    Humiliation, Afraid, Rape, and Kick questionnaire     Fear of Current or Ex-Partner: No     Emotionally Abused: No     Physically Abused: No     Sexually Abused: No   Depression: Not at risk (09/05/2021)    PHQ-2     PHQ-2 Score: 0   Social Connections: Unknown (11/30/2020)    Social Connection and Isolation Panel [NHANES]     Frequency of Communication with Friends and Family: More than three times a week     Frequency of Social Gatherings with Friends and Family: More than three times a week     Attends Religious Services: Patient refused     Active Member of Clubs or Organizations: Patient refused     Attends Banker Meetings: Patient refused     Marital Status: Divorced       Would you be willing  to receive help with any of the needs that you have identified today? Not applicable       SHIPPING     Specialty Medication(s) to be Shipped:   Inflammatory Disorders: Stelara    Other medication(s) to be shipped: No additional medications requested for fill at this time     Changes to insurance: No    Delivery Scheduled: Yes, Expected medication delivery date: 12/1.     Medication will be delivered via Same Day Courier to the confirmed prescription address in [facility].    The patient will receive a drug information handout for each medication shipped and additional FDA Medication Guides as required.  Verified that patient has previously received a Conservation officer, historic buildings and a Surveyor, mining.    The patient or caregiver noted above participated in the development of this care plan and knows that they can request review of or adjustments to the care plan at any time.      All of the patient's questions and concerns have been addressed.    Lanney Gins   [facility] Specialty Pharmacist

## 2022-03-13 ENCOUNTER — Ambulatory Visit: Admit: 2022-03-13 | Discharge: 2022-03-14 | Payer: MEDICARE

## 2022-03-13 DIAGNOSIS — R7989 Other specified abnormal findings of blood chemistry: Principal | ICD-10-CM

## 2022-03-13 DIAGNOSIS — I10 Essential (primary) hypertension: Principal | ICD-10-CM

## 2022-03-13 DIAGNOSIS — E1169 Type 2 diabetes mellitus with other specified complication: Principal | ICD-10-CM

## 2022-03-13 DIAGNOSIS — E785 Hyperlipidemia, unspecified: Principal | ICD-10-CM

## 2022-03-13 DIAGNOSIS — E1142 Type 2 diabetes mellitus with diabetic polyneuropathy: Principal | ICD-10-CM

## 2022-03-13 DIAGNOSIS — E11628 Type 2 diabetes mellitus with other skin complications: Principal | ICD-10-CM

## 2022-03-13 DIAGNOSIS — Z794 Long term (current) use of insulin: Principal | ICD-10-CM

## 2022-03-13 LAB — COMPREHENSIVE METABOLIC PANEL
ALBUMIN: 3.8 g/dL (ref 3.4–5.0)
ALKALINE PHOSPHATASE: 68 U/L (ref 46–116)
ALT (SGPT): 33 U/L (ref 10–49)
ANION GAP: 7 mmol/L (ref 5–14)
AST (SGOT): 32 U/L (ref <=34)
BILIRUBIN TOTAL: 0.3 mg/dL (ref 0.3–1.2)
BLOOD UREA NITROGEN: 23 mg/dL (ref 9–23)
BUN / CREAT RATIO: 24
CALCIUM: 9.7 mg/dL (ref 8.7–10.4)
CHLORIDE: 107 mmol/L (ref 98–107)
CO2: 26 mmol/L (ref 20.0–31.0)
CREATININE: 0.95 mg/dL
EGFR CKD-EPI (2021) MALE: >90 mL/min/{1.73_m2} (ref >=60)
GLUCOSE RANDOM: 152 mg/dL (ref 70–179)
POTASSIUM: 4.8 mmol/L (ref 3.4–4.8)
PROTEIN TOTAL: 7.3 g/dL (ref 5.7–8.2)
SODIUM: 140 mmol/L (ref 135–145)

## 2022-03-13 LAB — HEMOGLOBIN A1C
ESTIMATED AVERAGE GLUCOSE: 157 mg/dL
HEMOGLOBIN A1C: 7.1 % — ABNORMAL HIGH (ref 4.8–5.6)

## 2022-03-13 MED ORDER — GABAPENTIN 800 MG TABLET
ORAL_TABLET | Freq: Three times a day (TID) | ORAL | 1 refills | 90 days | Status: CP
Start: 2022-03-13 — End: 2023-03-13

## 2022-03-13 MED ORDER — DULOXETINE 20 MG CAPSULE,DELAYED RELEASE
ORAL_CAPSULE | 0 refills | 0 days | Status: CN
Start: 2022-03-13 — End: ?

## 2022-03-13 MED ORDER — TESTOSTERONE 20.25 MG/1.25 GRAM PER PUMP ACT.(1.62 %) TRANSDERMAL GEL
Freq: Every day | TRANSDERMAL | 0 refills | 90 days | Status: CP
Start: 2022-03-13 — End: 2023-03-13

## 2022-03-13 MED ORDER — LISINOPRIL 30 MG TABLET
ORAL_TABLET | Freq: Every day | ORAL | 1 refills | 90 days | Status: CN
Start: 2022-03-13 — End: 2023-03-13

## 2022-03-13 MED ORDER — LEVEMIR FLEXPEN 100 UNIT/ML (3 ML) SOLUTION SUBCUTANEOUS INSULIN PEN
Freq: Every day | SUBCUTANEOUS | 1 refills | 86 days | Status: CP
Start: 2022-03-13 — End: 2023-03-13

## 2022-03-13 MED ORDER — LISINOPRIL 40 MG TABLET
ORAL_TABLET | Freq: Every day | ORAL | 1 refills | 90 days | Status: CP
Start: 2022-03-13 — End: 2023-03-13

## 2022-03-13 MED ORDER — EMPAGLIFLOZIN 10 MG TABLET
ORAL_TABLET | Freq: Every day | ORAL | 1 refills | 90 days | Status: CP
Start: 2022-03-13 — End: ?

## 2022-03-13 MED ORDER — NOVOLOG FLEXPEN U-100 INSULIN ASPART 100 UNIT/ML (3 ML) SUBCUTANEOUS
Freq: Three times a day (TID) | SUBCUTANEOUS | 3 refills | 90 days | Status: CP
Start: 2022-03-13 — End: 2023-03-13

## 2022-03-13 MED ORDER — ATORVASTATIN 40 MG TABLET
ORAL_TABLET | Freq: Every day | ORAL | 1 refills | 90 days | Status: CP
Start: 2022-03-13 — End: ?

## 2022-03-13 MED ORDER — HYDROCHLOROTHIAZIDE 12.5 MG CAPSULE
ORAL_CAPSULE | Freq: Every day | ORAL | 3 refills | 90 days | Status: CN
Start: 2022-03-13 — End: 2023-03-13

## 2022-03-14 NOTE — Unmapped (Signed)
Assessment and Plan:     Cory Bentley was seen today for follow-up.    Diagnoses and all orders for this visit:    Type 2 diabetes mellitus with other skin complication, with long-term current use of insulin (CMS-HCC)    I have been seeing pt since May of 2022 - He came to me as a new patient   He was not taking his diabetes medication as prescribed and was only taking 50u of Levemir nightly - Since that time, I have discussed with him multiple times the need to increase insulin for better glycemic control but he continues not to be compliant with recommendations and is currently only taking 52u nightly despite blood sugars averaging over last 7d 195 and last 14d 181  He also does not check blood sugar consistently prior to meals and does not follow sliding scale for novolog dosing - He takes between 8-10u when he feels that it is needed   Pt is also not in agreement to change insulins/medications due to cost and he is on a fixed income     He has declined Endo referral previously but at his initial consult with Neurology ([facility]) he agreed to the referral per their note     He is currently on Jardiance 20mg  daily and will continue with this   I have discussed with pt the importance of increasing insulin to reach a goal blood sugar of 110-120   We discussed, again, increasing Levemir by 2u every 3rd day (as he reports previously having lows with 3 by 3 increase)  We also discussed importance of checking glucose prior to all meals and following sliding scale for dosing   He is normally taking 8u with 140 glucose per his report and 10u if 160 or higher most times  Will use following chart for consistence with dosing   150-200 take 4units  201-250 take 6units  251-300 take 8units  301-350 take 10units   351-400 take 12units     Will follow up in 4 weeks to see how blood sugars are ranging with insulin changes     -     Hemoglobin A1c  -     empagliflozin (JARDIANCE) 10 mg tablet; Take 2 tablets (20 mg total) by mouth daily.  -     NOVOLOG FLEXPEN U-100 INSULIN 100 unit/mL (3 mL) injection pen; Inject 0.1 mL (10 Units total) under the skin Three (3) times a day before meals.  -     insulin detemir U-100 (LEVEMIR FLEXPEN) 100 unit/mL (3 mL) injection pen; Inject 0.52 mL (52 Units total) under the skin daily.    Diabetic polyneuropathy associated with type 2 diabetes mellitus (CMS-HCC)    Pt saw neurology for initial consult 02/21/2022 ([facility])  They continued gabapentin and started him on cymbalta   They also referred to Endo which he had been resistant to before   They will take over management of neuropathy     -     gabapentin (NEURONTIN) 800 MG tablet; Take 1 tablet (800 mg total) by mouth Three (3) times a day.    Primary hypertension    Pt BP today is 142/78 with recheck 160/90  This is not at goal   This could be in part r/t testosterone therapy - Have discussed with pt that if BP remains elevated after medication adjustment that we will have to discontinue the testosterone   Pt denies CP, SHOB, DOE, palpitations, dizziness, HA's, vision changes   He is  currently on lisinopril 30mg  - Will increase to 40mg  today   If this does not bring into goal will add aldactone   Pt was seen by Nephrology 02/27/2022 and they started him on hydrochlorothiazide 12.5mg  daily   Will follow up in 4 weeks   Pt is encouraged to keep a home BP log and bring with them to next visit for review  Pt is also encouraged to bring their home BP monitor to compare to the office monitor    -     Comprehensive Metabolic Panel  -     lisinopriL (PRINIVIL,ZESTRIL) 40 MG tablet; Take 1 tablet (40 mg total) by mouth daily.    Hyperlipidemia associated with type 2 diabetes mellitus (CMS-HCC)    Patient is currently on atorvastatin 40 mg daily.  He is not following any sort of dietary or exercise regimen to improve.  However, last lipid panel was great.  Triglycerides 73, cholesterol 119, HDL 44, LDL 60.  Pt denies N/V/D/abdominal pain associated with medication.   Continue with this regimen.    -     atorvastatin (LIPITOR) 40 MG tablet; Take 1 tablet (40 mg total) by mouth daily.    Low testosterone in male  -     testosterone 20.25 mg/1.25 gram (1.62 %) gel pump; Place 2 sprays on the skin daily.    Other orders  -     INFLUENZA VACCINE (QUAD) IM - 6 MO-ADULT - PF        I personally spent 30 minutes face-to-face and non-face-to-face in the care of this patient, which includes all pre, intra, and post visit time on the date of service.  All documented time was specific to the E/M visit and does not include any procedures that may have been performed.       Return in about 4 weeks (around 04/10/2022) for Recheck HTN .    Subjective:     HPI: Cory Bentley is a 61 y.o. male here for   Chief Complaint   Patient presents with    Follow-up   :    Cory Bentley was seen today for follow-up.    Diagnoses and all orders for this visit:    Type 2 diabetes mellitus with other skin complication, with long-term current use of insulin (CMS-HCC)    I have been seeing pt since May of 2022 - He came to me as a new patient   He was not taking his diabetes medication as prescribed and was only taking 50u of Levemir nightly - Since that time, I have discussed with him multiple times the need to increase insulin for better glycemic control but he continues not to be compliant with recommendations and is currently only taking 52u nightly despite blood sugars averaging over last 7d 195 and last 14d 181  He also does not check blood sugar consistently prior to meals and does not follow sliding scale for novolog dosing - He takes between 8-10u when he feels that it is needed   Pt is also not in agreement to change insulins/medications due to cost and he is on a fixed income     He has declined Endo referral previously but at his initial consult with Neurology ([facility]) he agreed to the referral per their note     He is currently on Jardiance 20mg  daily and will continue with this   I have discussed with pt the importance of increasing insulin to reach a goal blood sugar of 110-120  We discussed, again, increasing Levemir by 2u every 3rd day (as he reports previously having lows with 3 by 3 increase)  We also discussed importance of checking glucose prior to all meals and following sliding scale for dosing   He is normally taking 8u with 140 glucose per his report and 10u if 160 or higher most times  Will use following chart for consistence with dosing   150-200 take 4units  201-250 take 6units  251-300 take 8units  301-350 take 10units   351-400 take 12units     Will follow up in 4 weeks to see how blood sugars are ranging with insulin changes     -     Hemoglobin A1c  -     empagliflozin (JARDIANCE) 10 mg tablet; Take 2 tablets (20 mg total) by mouth daily.  -     NOVOLOG FLEXPEN U-100 INSULIN 100 unit/mL (3 mL) injection pen; Inject 0.1 mL (10 Units total) under the skin Three (3) times a day before meals.  -     insulin detemir U-100 (LEVEMIR FLEXPEN) 100 unit/mL (3 mL) injection pen; Inject 0.52 mL (52 Units total) under the skin daily.    Diabetic polyneuropathy associated with type 2 diabetes mellitus (CMS-HCC)    Pt saw neurology for initial consult 02/21/2022 ([facility])  They continued gabapentin and started him on cymbalta   They also referred to Endo which he had been resistant to before   They will take over management of neuropathy     -     gabapentin (NEURONTIN) 800 MG tablet; Take 1 tablet (800 mg total) by mouth Three (3) times a day.    Primary hypertension    Pt BP today is 142/78 with recheck 160/90  This is not at goal   This could be in part r/t testosterone therapy - Have discussed with pt that if BP remains elevated after medication adjustment that we will have to discontinue the testosterone   Pt denies CP, SHOB, DOE, palpitations, dizziness, HA's, vision changes   He is currently on lisinopril 30mg  - Will increase to 40mg  today   If this does not bring into goal will add aldactone   Pt was seen by Nephrology 02/27/2022 and they started him on hydrochlorothiazide 12.5mg  daily   Will follow up in 4 weeks   Pt is encouraged to keep a home BP log and bring with them to next visit for review  Pt is also encouraged to bring their home BP monitor to compare to the office monitor    -     Comprehensive Metabolic Panel  -     lisinopriL (PRINIVIL,ZESTRIL) 40 MG tablet; Take 1 tablet (40 mg total) by mouth daily.    Hyperlipidemia associated with type 2 diabetes mellitus (CMS-HCC)    Patient is currently on atorvastatin 40 mg daily.  He is not following any sort of dietary or exercise regimen to improve.  However, last lipid panel was great.  Triglycerides 73, cholesterol 119, HDL 44, LDL 60.  Pt denies N/V/D/abdominal pain associated with medication.   Continue with this regimen.    -     atorvastatin (LIPITOR) 40 MG tablet; Take 1 tablet (40 mg total) by mouth daily.    Low testosterone in male  -     testosterone 20.25 mg/1.25 gram (1.62 %) gel pump; Place 2 sprays on the skin daily.    Other orders  -     INFLUENZA VACCINE (QUAD) IM - 6 MO-ADULT -  PF          ROS:   Review of Systems   Constitutional:  Negative for activity change, appetite change, fatigue and unexpected weight change.   Respiratory:  Negative for chest tightness and shortness of breath.    Cardiovascular:  Negative for chest pain and palpitations.   Gastrointestinal:  Negative for abdominal pain, constipation, diarrhea, nausea and vomiting.   Endocrine: Negative for polydipsia, polyphagia and polyuria.   Musculoskeletal:  Positive for gait problem.   Skin:  Negative for rash.   Neurological:  Negative for dizziness, light-headedness and headaches.   Psychiatric/Behavioral:  Negative for sleep disturbance. The patient is not nervous/anxious.         Review of systems negative unless otherwise noted as per HPI    Objective:     Visit Vitals  BP 160/90   Pulse 86   Temp 37.1 ??C (98.7 ??F) (Temporal)   Ht 172.7 cm (5' 8) Wt 93 kg (205 lb)   SpO2 96%   BMI 31.17 kg/m??          03/13/22 1308   PainSc: 6         Physical Exam  Constitutional:       General: He is not in acute distress.     Appearance: Normal appearance. He is well-developed and well-groomed. He is not ill-appearing.   HENT:      Head: Normocephalic and atraumatic.      Mouth/Throat:      Lips: Pink.      Mouth: Mucous membranes are moist.   Eyes:      Conjunctiva/sclera: Conjunctivae normal.   Cardiovascular:      Rate and Rhythm: Normal rate and regular rhythm.      Heart sounds: Normal heart sounds, S1 normal and S2 normal.   Pulmonary:      Effort: Pulmonary effort is normal.      Breath sounds: Normal breath sounds and air entry.   Abdominal:      General: Bowel sounds are normal.      Palpations: Abdomen is soft.      Tenderness: There is no abdominal tenderness.   Skin:     General: Skin is warm and dry.   Neurological:      Mental Status: He is alert and oriented to person, place, and time.   Psychiatric:         Attention and Perception: Attention and perception normal.         Mood and Affect: Affect normal. Mood is anxious.         Speech: Speech normal.         Behavior: Behavior normal. Behavior is cooperative.         Thought Content: Thought content normal.          [facility]:     Medication adherence and barriers to the treatment plan have been addressed. Opportunities to optimize healthy behaviors have been discussed. Patient / caregiver voiced understanding.

## 2022-03-18 NOTE — Unmapped (Signed)
Good afternoon Cory Bentley     I have reviewed your labs     Your A1c is still not at goal, though slightly improved from last visit   It is 7.1 and we would like for it to be below 7.0  I encourage you to increase your insulin as we discussed and we will recheck your A1c at your next visit     Otherwise, your labs look good     [name]

## 2022-04-05 DIAGNOSIS — K219 Gastro-esophageal reflux disease without esophagitis: Principal | ICD-10-CM

## 2022-04-05 MED ORDER — OMEPRAZOLE 20 MG CAPSULE,DELAYED RELEASE
ORAL_CAPSULE | Freq: Every day | ORAL | 1 refills | 90 days | Status: CP
Start: 2022-04-05 — End: 2023-04-05

## 2022-04-24 ENCOUNTER — Ambulatory Visit: Admit: 2022-04-24 | Discharge: 2022-04-25 | Payer: MEDICARE

## 2022-04-24 DIAGNOSIS — I10 Essential (primary) hypertension: Principal | ICD-10-CM

## 2022-04-24 DIAGNOSIS — E1169 Type 2 diabetes mellitus with other specified complication: Principal | ICD-10-CM

## 2022-04-24 DIAGNOSIS — E1142 Type 2 diabetes mellitus with diabetic polyneuropathy: Principal | ICD-10-CM

## 2022-04-24 DIAGNOSIS — E785 Hyperlipidemia, unspecified: Principal | ICD-10-CM

## 2022-04-24 NOTE — Unmapped (Addendum)
Assessment and Plan:     [name] was seen today for hypertension.    Diagnoses and all orders for this visit:    Primary hypertension  Pt BP today is 134/82 with recheck of 128/78.  This is considered at goal.   Pt denies CP, SHOB, DOE, palpitations, dizziness, HA and vision changes.   He is currently on Lisinopril 40 mg daily and Hydrochlorothiazide 12.5 mg daily.   Will continue this current regimen.   Pt is encouraged to keep a home BP log and bring with them to next visit for review. Pt is also encouraged to bring their home BP monitor to compare to the office monitor    Hyperlipidemia associated with type 2 diabetes mellitus (CMS-HCC)  Patient is currently on atorvastatin 40 mg daily.  He is not following any sort of dietary or exercise regimen.  Pt denies N/V/D/abdominal pain associated with medication.   Continue with this regimen.    Diabetic polyneuropathy associated with type 2 diabetes mellitus (CMS-HCC)  Pt is currently on Jardiance 20 mg daily, Levemir 52 units daily  He is currently using the following chart for consistence with Novolog dosing   150-200 take 4units  201-250 take 6units  251-300 take 8units  301-350 take 10units   351-400 take 12units    He uses freestyle libre. Average of 7d is 176.   He is followed by Endo. He has an appt this month with them.  Will continue current medication regimen and continue collaboration with endo.             HPI obtained and examination performed by Jobie Quaker, Nurse Practitioner Student. I was present during the visit, participating in the key components of the service and supervising the time spent by the NP student in work on the day of the clinic visit.  Cory Lowe FNP-C      Return in about 3 months (around 07/24/2022) for Recheck.    Subjective:     HPI: Cory Bentley is a 62 y.o. male here for   Chief Complaint   Patient presents with    Hypertension     Follow up   :    Cory Bentley was seen today for hypertension.    Diagnoses and all orders for this visit:    Primary hypertension  Pt BP today is 134/82 with recheck of 128/78.  This is considered at goal.   Pt denies CP, SHOB, DOE, palpitations, dizziness, HA and vision changes.   He is currently on Lisinopril 40 mg daily and Hydrochlorothiazide 12.5 mg daily.   Will continue this current regimen.   Pt is encouraged to keep a home BP log and bring with them to next visit for review. Pt is also encouraged to bring their home BP monitor to compare to the office monitor    Hyperlipidemia associated with type 2 diabetes mellitus (CMS-HCC)  Patient is currently on atorvastatin 40 mg daily.  He is not following any sort of dietary or exercise regimen.  Pt denies N/V/D/abdominal pain associated with medication.   Continue with this regimen.    Diabetic polyneuropathy associated with type 2 diabetes mellitus (CMS-HCC)  Pt is currently on Jardiance 20 mg daily, Levemir 52 units daily  He is currently using the following chart for consistence with Novolog dosing   150-200 take 4units  201-250 take 6units  251-300 take 8units  301-350 take 10units   351-400 take 12units    He uses freestyle libre. Average of  7d is 176.   He is followed by Endo. HE has an appt this month with them.  Will continue current medication regimen and continue collaboration with endo.       Hypertension  Pertinent negatives include no chest pain, headaches, palpitations or shortness of breath.         ROS:   Review of Systems   Constitutional:  Negative for activity change, appetite change, fatigue and unexpected weight change.   Respiratory:  Negative for chest tightness and shortness of breath.    Cardiovascular:  Negative for chest pain and palpitations.   Gastrointestinal:  Negative for abdominal pain, constipation, diarrhea, nausea and vomiting.   Endocrine: Negative for polydipsia, polyphagia and polyuria.   Musculoskeletal:  Positive for gait problem.   Skin:  Negative for rash.   Neurological:  Negative for dizziness, light-headedness and headaches.   Psychiatric/Behavioral:  Negative for sleep disturbance. The patient is not nervous/anxious.         Review of systems negative unless otherwise noted as per HPI    Objective:     Visit Vitals  BP 128/78   Pulse 89   Temp 36.4 ??C (97.6 ??F)   Wt 91.9 kg (202 lb 9.6 oz)   SpO2 98%   BMI 30.81 kg/m??     There were no vitals filed for this visit.     Physical Exam  Vitals reviewed.   Constitutional:       General: He is not in acute distress.     Appearance: Normal appearance. He is well-developed and well-groomed. He is not ill-appearing.   HENT:      Head: Normocephalic and atraumatic.      Mouth/Throat:      Lips: Pink.      Mouth: Mucous membranes are moist.   Eyes:      Conjunctiva/sclera: Conjunctivae normal.   Cardiovascular:      Rate and Rhythm: Normal rate and regular rhythm.      Heart sounds: Normal heart sounds, S1 normal and S2 normal.   Pulmonary:      Effort: Pulmonary effort is normal.      Breath sounds: Normal breath sounds and air entry.   Abdominal:      General: Bowel sounds are normal.      Palpations: Abdomen is soft.      Tenderness: There is no abdominal tenderness.   Skin:     General: Skin is warm and dry.   Neurological:      Mental Status: He is alert and oriented to person, place, and time.   Psychiatric:         Attention and Perception: Attention and perception normal.         Mood and Affect: Mood and affect normal.         Speech: Speech normal.         Behavior: Behavior normal. Behavior is cooperative.         Thought Content: Thought content normal.          [facility]:     Medication adherence and barriers to the treatment plan have been addressed. Opportunities to optimize healthy behaviors have been discussed. Patient / caregiver voiced understanding.

## 2022-06-04 DIAGNOSIS — Z79899 Other long term (current) drug therapy: Principal | ICD-10-CM

## 2022-06-04 DIAGNOSIS — L409 Psoriasis, unspecified: Principal | ICD-10-CM

## 2022-06-04 NOTE — Unmapped (Signed)
[facility] Refill Coordination Note    Specialty Medication(s) to be Shipped:   Inflammatory Disorders: Stelara    Other medication(s) to be shipped: No additional medications requested for fill at this time     Cory Bentley, DOB: 02/09/1961  Phone: [phone] (home) [phone] (work)      All above HIPAA information was verified with patient.     Was a Nurse, learning disability used for this call? No    Completed refill call assessment today to schedule patient's medication shipment from the [facility] [phone]).  All relevant notes have been reviewed.     Specialty medication(s) and dose(s) confirmed: Regimen is correct and unchanged.   Changes to medications: [name] reports no changes at this time.  Changes to insurance: Yes: Noted in [place]  New side effects reported not previously addressed with a pharmacist or physician: None reported  Questions for the pharmacist: No    Confirmed patient received a Conservation officer, historic buildings and a Surveyor, mining with first shipment. The patient will receive a drug information handout for each medication shipped and additional FDA Medication Guides as required.       DISEASE/MEDICATION-SPECIFIC INFORMATION        For patients on injectable medications: Patient currently has 0 doses left.  Next injection is scheduled for 2/28.    SPECIALTY MEDICATION ADHERENCE     Medication Adherence    Patient reported X missed doses in the last month: 0  Specialty Medication: Stelara  Patient is on additional specialty medications: No  Informant: patient              Were doses missed due to medication being on hold? No    Stelara 45/0.5 mg/ml: 0 days of medicine on hand       REFERRAL TO PHARMACIST     Referral to the pharmacist: Not needed      [facility]     Shipping address confirmed in Epic.     Patient was notified of new phone menu : No    Delivery Scheduled: Yes, Expected medication delivery date: 2/23.     Medication will be delivered via Same Day Courier to the prescription address in Epic WAM.    Cory Bentley   [facility] Specialty Technician

## 2022-06-07 NOTE — Unmapped (Signed)
Donney Rankins 's Stelara shipment will be delayed as a result of insurance processor is down.     I have reached out to the patient  at [phone] and communicated the delivery change. We will reschedule the medication for the delivery date that the patient agreed upon.  We have confirmed the delivery date as 2/27, via same day courier.

## 2022-06-08 DIAGNOSIS — L089 Local infection of the skin and subcutaneous tissue, unspecified: Principal | ICD-10-CM

## 2022-06-08 MED ORDER — CEPHALEXIN 500 MG CAPSULE
ORAL_CAPSULE | 0 refills | 0 days
Start: 2022-06-08 — End: ?

## 2022-06-08 MED ORDER — BD ULTRA-FINE MINI PEN NEEDLE 31 GAUGE X 3/16" (5 MM)
0 refills | 0 days
Start: 2022-06-08 — End: ?

## 2022-06-09 DIAGNOSIS — Z79899 Other long term (current) drug therapy: Principal | ICD-10-CM

## 2022-06-09 DIAGNOSIS — L409 Psoriasis, unspecified: Principal | ICD-10-CM

## 2022-06-09 MED ORDER — BD ULTRA-FINE MINI PEN NEEDLE 31 GAUGE X 3/16" (5 MM)
1 refills | 0 days | Status: CP
Start: 2022-06-09 — End: 2023-06-10

## 2022-06-09 MED ORDER — CEPHALEXIN 500 MG CAPSULE
ORAL_CAPSULE | 0 refills | 0 days
Start: 2022-06-09 — End: ?

## 2022-06-11 MED FILL — STELARA 45 MG/0.5 ML SUBCUTANEOUS SYRINGE: 84 days supply | Qty: 0.5 | Fill #1

## 2022-06-12 ENCOUNTER — Ambulatory Visit: Admit: 2022-06-12 | Discharge: 2022-06-13 | Payer: MEDICARE

## 2022-06-12 DIAGNOSIS — Z794 Long term (current) use of insulin: Principal | ICD-10-CM

## 2022-06-12 DIAGNOSIS — E11628 Type 2 diabetes mellitus with other skin complications: Principal | ICD-10-CM

## 2022-06-12 DIAGNOSIS — I10 Essential (primary) hypertension: Principal | ICD-10-CM

## 2022-06-12 DIAGNOSIS — R809 Proteinuria, unspecified: Principal | ICD-10-CM

## 2022-06-12 LAB — PROTEIN / CREATININE RATIO, URINE
CREATININE, URINE: 98.3 mg/dL
PROTEIN URINE: 55.2 mg/dL
PROTEIN/CREAT RATIO, URINE: 0.562

## 2022-06-12 NOTE — Unmapped (Signed)
PCP:  Amie Portland, FNP    06/12/2022  [id number]  [facility]  [address]  [name]  [place] [address]  Dept: [phone]  Loc: [phone]    Chief Complaint: Followup of proteinuria    HPI:  [name] is a 62 y.o.  male who presents for followup of proteinuria. 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 His diabetes was poorly controlled for many years, with a documented a1c on his chart of 14%. He notes that this was fairly typical for him during that time period and many times he would max out his glucometer. His glucose is much better controlled now, but there is still room for improvement.     FH is positive for a cousin who required kidney transplant x2. The cause of renal disease is unknown. FH is also positive for DM2.     Was in ED 07/25/2021 for cellulitis to his right great toe. His SBP was noted to be 200 and his lisinopril was increased to 30mg  and he was started on nifedipine 30mg . He is not taking the nifedipine at this time as it made him feel strange with some parathesias to his neck.     He did not take his blood pressure medications this morning. He does not check his blood pressure at home but is getting a monitor.     ROS:   CONSTITUTIONAL: denies fevers or chills, denies unintentional weight loss   CARDIOVASCULAR: denies chest pain, denies dyspnea on exertion, denies leg edema  GASTROINTESTINAL: denies nausea, denies vomiting, denies anorexia  GENITOURINARY: denies dysuria, denies hematuria, denies decreased urinary stream  All 10 systems reviewed and are negative except as listed above.    PAST MEDICAL HISTORY:  Past Medical History:   Diagnosis Date    Cellulitis of left foot 07/24/2021    Diabetes mellitus (CMS-HCC)     Hypertension     Psoriasis     Substance abuse (CMS-HCC)        ALLERGIES  Patient has no known allergies.    FAMILY HISTORY  Family History   Problem Relation Age of Onset    Diabetes Mother     Diabetes type II Mother     Ovarian cancer Mother     Diabetes Father     Diabetes type II Father     Diabetes type II Sister     Diabetes type II Brother     Diabetes type II Maternal Grandmother     Diabetes type II Maternal Grandfather     Diabetes type II Paternal Grandmother     Diabetes type II Paternal Grandfather     Melanoma Neg Hx     Basal cell carcinoma Neg Hx     Squamous cell carcinoma Neg Hx        SOCIAL HISTORY   reports that he has never smoked. He has never been exposed to tobacco smoke. He has never used smokeless tobacco. He reports current alcohol use. He reports current drug use. Frequency: 7.00 times per week. Drug: Marijuana.    MEDICATIONS:  Current Outpatient Medications   Medication Sig Dispense Refill    alcohol swabs (ALCOHOL PADS) PadM Apply 1 swab topically four (4) times a day. 400 each 1    atorvastatin (LIPITOR) 40 MG tablet  Take 1 tablet (40 mg total) by mouth daily. 90 tablet 1    BD ULTRA-FINE MINI PEN NEEDLE 31 gauge x 3/16 (5 mm) Ndle INJECT FOUR TIMES DAILY 400 each 1    betamethasone valerate (VALISONE) 0.1 % ointment Apply to thick areas of psoriasis twice daily as needed until smooth. Avoid face and skin folds. 45 g 8    blood sugar diagnostic Strp Accu Check Aviva Strip, use 4 times per day for monitoring insulin dependant uncontrolled diabetes      blood-glucose meter kit Use as instructed 1 each 0    diaper,brief,adult,disposable Misc 1 each by Miscellaneous route two (2) times a day. 20 each 2    DULoxetine (CYMBALTA) 20 MG capsule       empagliflozin (JARDIANCE) 10 mg tablet Take 2 tablets (20 mg total) by mouth daily. 180 tablet 1    empty container Misc Use as directed to dispose of Stelara syringes. 1 each 2    flash glucose sensor (FREESTYLE LIBRE 14 DAY SENSOR) kit Apply new sensor every 14 days and use as directed to monitor and check blood sugars 2 each 12    gabapentin (NEURONTIN) 800 MG tablet Take 1 tablet (800 mg total) by mouth Three (3) times a day. 270 tablet 1    hydroCHLOROthiazide (MICROZIDE) 12.5 mg capsule Take 1 capsule (12.5 mg total) by mouth daily. 90 capsule 3    hydrocortisone 2.5 % ointment Apply to psoriasis in ears up to twice daily as needed until smooth. 30 g 4    insulin detemir U-100 (LEVEMIR FLEXPEN) 100 unit/mL (3 mL) injection pen Inject 0.52 mL (52 Units total) under the skin daily. 45 mL 1    lisinopriL (PRINIVIL,ZESTRIL) 40 MG tablet Take 1 tablet (40 mg total) by mouth daily. 90 tablet 1    NOVOLOG FLEXPEN U-100 INSULIN 100 unit/mL (3 mL) injection pen Inject 0.1 mL (10 Units total) under the skin Three (3) times a day before meals. (Patient taking differently: Inject 0.08 mL (8 Units total) under the skin Three (3) times a day before meals.) 27 mL 3    omeprazole (PRILOSEC) 20 MG capsule Take 1 capsule (20 mg total) by mouth daily. TAKE 1 CAPSULE(20 MG) BY MOUTH DAILY 90 capsule 1    testosterone 20.25 mg/1.25 gram (1.62 %) gel pump Place 2 sprays on the skin daily. 3.65 g 0    TRULICITY 0.75 mg/0.5 mL injection pen 0.5 mL (0.75 mg total).      TRULICITY 1.5 mg/0.5 mL PnIj 0.5 mL (1.5 mg total).      ustekinumab (STELARA) 45 mg/0.5 mL Syrg syringe Inject the contents of 1 syringe (45 mg total) under the skin every 12 weeks. 2 mL 3     No current facility-administered medications for this visit.       PHYSICAL EXAM:  Wt Readings from Last 3 Encounters:   06/12/22 91.6 kg (202 lb)   04/24/22 91.9 kg (202 lb 9.6 oz)   03/13/22 93 kg (205 lb)     Temp Readings from Last 3 Encounters:   04/24/22 36.4 ??C (97.6 ??F)   03/13/22 37.1 ??C (98.7 ??F) (Temporal)   02/27/22 36.4 ??C (97.6 ??F) (Temporal)     BP Readings from Last 5 Encounters:   04/24/22 128/78   03/13/22 160/90   02/27/22 159/103   12/06/21 160/100   11/23/21 128/72     Pulse Readings from Last 3 Encounters:   04/24/22 89   03/13/22 86  02/27/22 91       CONSTITUTIONAL: Alert,well appearing, no distress. Ambulates with a cane  HEENT: Moist mucous membranes, oropharynx clear without erythema or exudate  EYES: Extra ocular movements intact. Pupils reactive, sclerae anicteric.  NECK: Supple, no lymphadenopathy  CARDIOVASCULAR: Regular, normal S1/S2 heart sounds, no murmurs, no rubs.   PULM: Clear to auscultation bilaterally  GASTROINTESTINAL: Soft, active bowel sounds, nontender  EXTREMITIES: No lower extremity edema bilaterally.   SKIN: No rashes or lesions  NEUROLOGIC: No focal motor or sensory deficits    MEDICAL DECISION MAKING  Urinalysis performed in office revealed specific gravity 1.015 with pH of 6 and was negative for leukocyte esterase, nitrite, urobilinogen, protein, blood, ketone, bilirubin or glucose.     Creatinine   Date Value Ref Range Status   03/13/2022 0.95 0.73 - 1.18 mg/dL Final   16/01/9603 5.40 0.50 - 1.40 mg/dL Final   98/02/9146 8.29 0.50 - 1.40 mg/dL Final   56/21/3086 5.78 0.50 - 1.40 mg/dL Final   46/96/2952 8.41 0.60 - 1.10 mg/dL Final   32/44/0102 7.25 0.60 - 1.10 mg/dL Final   36/64/4034 7.42 0.60 - 1.10 mg/dL Final   59/56/3875 1.0 0.5 - 1.4 mg/dL Final   64/33/2951 0.9 0.5 - 1.4 mg/dL Final   88/41/6606 0.8 0.5 - 1.4 mg/dL Final   30/16/0109 1.1 0.5 - 1.4 mg/dL Final   32/35/5732 1.0 0.5 - 1.4 mg/dL Final   20/25/4270 1.2 0.5 - 1.4 mg/dL Final   62/37/6283 1.1 0.5 - 1.4 mg/dL Final   15/17/6160 1.0 0.5 - 1.4 mg/dL Final   73/71/0626 0.8 0.5 - 1.4 mg/dL Final   94/85/4627 0.9 0.5 - 1.4 mg/dL Final        Lab Results   Component Value Date    NA 140 03/13/2022    K 4.8 03/13/2022    CL 107 03/13/2022    CO2 26.0 03/13/2022    BUN 23 03/13/2022    CREATININE 0.95 03/13/2022    GLU 152 03/13/2022    CALCIUM 9.7 03/13/2022    ALBUMIN 3.8 03/13/2022    PHOS 3.0 07/20/2021       Lab Results   Component Value Date    CALCIUM 9.7 03/13/2022       Lab Results   Component Value Date    BILITOT 0.3 03/13/2022    BILIDIR 0.40 06/01/2018    PROT 7.3 03/13/2022    ALBUMIN 3.8 03/13/2022    ALT 33 03/13/2022    AST 32 03/13/2022    ALKPHOS 68 03/13/2022    GGT 1,007 (H) 05/28/2018     Lab Results   Component Value Date    INR 1.02 07/24/2021    APTT 27.3 07/24/2021       HGB   Date Value Ref Range Status   07/25/2021 12.8 (L) 14.0 - 18.0 g/dL Final   03/50/0938 18.2 (L) 14.0 - 18.0 g/dL Final   99/37/1696 78.9 (L) 14.0 - 18.0 g/dL Final   38/01/1750 02.5 12.9 - 16.5 g/dL Final   85/27/7824 23.5 (L) 14.0 - 18.0 g/dL Final   36/14/4315 40.0 (L) 14.0 - 18.0 g/dL Final   86/76/1950 93.2 (L) 14.0 - 18.0 g/dL Final   67/03/4579 99.8 (L) 14.0 - 18.0 g/dL Final   33/82/5053 97.6 (L) 14.0 - 18.0 g/dL Final   73/41/9379 02.4 (L) 14.0 - 18.0 g/dL Final   09/73/5329 92.4 (L) 14.0 - 18.0 g/dL Final   26/83/4196 22.2 (L) 14.0 - 18.0 g/dL Final  03/28/2019 10.8 (L) 14.0 - 18.0 g/dL Final   57/84/6962 95.2 (L) 14.0 - 18.0 g/dL Final        Lab Results   Component Value Date    WBC 8.6 07/25/2021    HGB 12.8 (L) 07/25/2021    HCT 40.8 (L) 07/25/2021    PLT 167 07/25/2021     Lab Results   Component Value Date    IRON 90 01/27/2020    TIBC 319 01/27/2020    FERRITIN 47.4 01/27/2020       Lab Results   Component Value Date    A1C 7.1 (H) 03/13/2022    A1C 7.3 (H) 12/06/2021    A1C 7.5 (H) 07/24/2021       Lab Results   Component Value Date    Color, UA Yellow 03/30/2019    Protein, Ur 86.6 07/20/2021    Glucose, UA Trace (A) 03/30/2019    Ketones, UA Negative 03/30/2019    Blood, UA Trace (A) 03/30/2019    Nitrite, UA Negative 03/30/2019   ]    Lab Results   Component Value Date    PROTEINUR 86.6 07/20/2021    CREATUR 52.7 09/05/2021     Lab Results   Component Value Date    PCRATIOUR 1.468 07/20/2021    PCRATIOUR 0.943 01/24/2021       Lab Results   Component Value Date    CHOL 119 09/05/2021    A1C 7.1 (H) 03/13/2022    ANA Negative 05/27/2018    HEPAIGG Nonreactive 04/22/2018    HEPBCAB Nonreactive 09/26/2020    HEPCAB Nonreactive 09/26/2020       ECG 12 Lead  Normal sinus rhythm  Anteroseptal infarct (cited on or before 26-Mar-2019)  Abnormal ECG  Confirmed by Arnette Norris ([id number]) on 07/27/2021 9:15:32 PM       ASSESSMENT/PLAN:    Mr.Cory Bentley is a 62 y.o. patient with a past medical history significant for DM2 w/ DR who is being seen for evaluation of proteinuria.     1. Chronic Kidney Disease Stage 1 - the patient has preserved renal function however he is developing proteinuria which is an indicator of the onset of renal disease. At this time, I urged him to maintain stringent glucose control. He is on Jardiance and lisinopril. If possible, we can try to increase the dose of lisinopril. Continue to check BMP and Urine pro/cr ratio before each visit. Will order these to be done at PCP office 11/29.     2. HTN - Add HCTZ 12.5mg . Hold off on nifedipine as it may have caused parathesias     3. DM2 w/ DR - A1c has been as high as 14% in the past, but most recently is 7.3%. Ideally, I would like him to be <7%. Defer to PCP for management. Congratulated him on this improvement.     Mr.Cory Bentley will follow up in 3 month(s) labs not needed.       Disclaimer: Much of the narrative of this dictation was acquired using speech recognition software. It is possible that some dictated speech was not transcribed accurately by this system nor detected in the proofing. Such errors are at times unavoidable.

## 2022-07-02 ENCOUNTER — Ambulatory Visit: Admit: 2022-07-02 | Discharge: 2022-07-03 | Payer: MEDICARE

## 2022-07-02 DIAGNOSIS — L409 Psoriasis, unspecified: Principal | ICD-10-CM

## 2022-07-02 DIAGNOSIS — L720 Epidermal cyst: Principal | ICD-10-CM

## 2022-07-02 DIAGNOSIS — Z79899 Other long term (current) drug therapy: Principal | ICD-10-CM

## 2022-07-02 NOTE — Unmapped (Signed)
Dermatology Note     Assessment and Plan:      Psoriasis, Chronic problem, stable on Stelara - at patient's treatment goal   - Continue ustekinumab (STELARA) 45 mg/0.5 mL Syrg syringe; Inject the contents of 1 syringe (45 mg) under the skin every 12 weeks. Does not require refill today.  - Tolerating Stelara well  - Continue betamethasone valerate (VALISONE) 0.1 % ointment; Apply to thick areas of psoriasis twice daily as needed until smooth. Avoid face and skin folds. Does not require refill today.  - Continue hydrocortisone 2.5 % ointment; Apply to psoriasis on ears up to twice daily as needed until smooth. Does not require refill today.  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 Mathews Robinsons    The patient was advised to call for an appointment should any new, changing, or symptomatic lesions develop.     RTC: Return in about 6 months (around 01/02/2023) for Psoriasis. or sooner as needed   _________________________________________________________________      Chief Complaint     Chief Complaint   Patient presents with    Psoriasis     6 month follow up for psoriasis.   Pt states he has spot on forehead that he would like to get removes-possible black head.       HPI     Cory Bentley is a 62 y.o. male who presents as a returning patient (last seen 01/01/2022) to Dermatology for follow up of psoriasis.    Today patient reports:  - taking Stelara 45mg  q12 weeks  - psoriasis is well-controlled  - patient has an appointment with rheumatology on 08/08/22 for joint pains    The patient denies any other new or changing lesions or areas of concern.     Pertinent Past Medical History     Past Medical History, Family History, Social History, Medication List, Allergies, and Problem List were reviewed in the rooming section of Epic.     ROS: Other than symptoms mentioned in the HPI, no fevers, chills, or other skin complaints    Physical Examination     GENERAL: Well-appearing male in no acute distress, resting comfortably.  NEURO: Alert and oriented, answers questions appropriately  SKIN: Examination of the face, hands, and fingernails was performed    - Well circumscribed erythematous papules and plaques with overlying silvery scale on elbows  - A firm, skin-colored, subcutaneous nodule that is freely movable and has a central punctum located on the R temple    All areas not commented on are within normal limits or unremarkable    (Approved Template 12/27/2019)

## 2022-07-07 DIAGNOSIS — L409 Psoriasis, unspecified: Principal | ICD-10-CM

## 2022-07-07 MED ORDER — BETAMETHASONE VALERATE 0.1 % TOPICAL OINTMENT
8 refills | 0.00000 days
Start: 2022-07-07 — End: ?

## 2022-07-07 MED ORDER — HYDROCORTISONE 2.5 % TOPICAL OINTMENT
0 refills | 0.00000 days
Start: 2022-07-07 — End: ?

## 2022-07-08 MED ORDER — BETAMETHASONE VALERATE 0.1 % TOPICAL OINTMENT
8 refills | 0.00000 days | Status: CP
Start: 2022-07-08 — End: ?

## 2022-07-08 MED ORDER — HYDROCORTISONE 2.5 % TOPICAL OINTMENT
0 refills | 0.00000 days | Status: CP
Start: 2022-07-08 — End: ?

## 2022-07-17 ENCOUNTER — Other Ambulatory Visit: Payer: Self-pay | Admitting: Ophthalmology

## 2022-07-17 ENCOUNTER — Ambulatory Visit: Admit: 2022-07-17 | Discharge: 2022-07-18 | Payer: MEDICARE

## 2022-07-17 DIAGNOSIS — H0520 Unspecified exophthalmos: Secondary | ICD-10-CM | POA: Diagnosis not present

## 2022-07-17 DIAGNOSIS — L729 Follicular cyst of the skin and subcutaneous tissue, unspecified: Secondary | ICD-10-CM | POA: Diagnosis not present

## 2022-07-17 DIAGNOSIS — H5711 Ocular pain, right eye: Secondary | ICD-10-CM | POA: Diagnosis not present

## 2022-07-17 DIAGNOSIS — L720 Epidermal cyst: Secondary | ICD-10-CM | POA: Diagnosis not present

## 2022-07-17 DIAGNOSIS — H532 Diplopia: Secondary | ICD-10-CM

## 2022-07-17 NOTE — Unmapped (Addendum)
[facility] releases most results to you as soon as they are available. Therefore, you may see some results before we do. Please give [place] 3 business days to review the tests and contact you by phone or through MyChart. If you are concerned that some results may be upsetting or confusing, you may wish to wait until we contact you before looking at the report in MyChart.   If you have an urgent question, you can send [place] a message or call our clinic. Otherwise, we prefer that you wait 3 business days for [place] to contact you.    [facility] Dermatology Clinical Team     Managing your wound after skin surgery    Please avoid strenuous activity for 48 hours and all heavy exercise for 1 week  If your wound is on your arm, leg, or shoulder, then avoid heavy lifting, straining, or exercise with that limb for at least one week.  If your wound is on the head or neck, avoid bending over if at all possible.  Sleep with your head elevated for 48 hours to reduce the risk of bleeding.  Please don???t smoke for 3 weeks; smoking prevents healthy wound healing  Pain management: Use  600 mg of acetaminophen (Tylenol ) every 4 hours, or medication we prescribed as needed. Pain should decrease steadily for the 1st few days after your surgery.   Possible complications:  Bleeding: a small amount of blood on the bandage is normal.   If there is a significant bleeding into the bandage or beneath the stitches (this will look like swelling and purple discoloration), apply firm pressure  with a cloth or bandage for 20 minutes without interruption. Repeat if bleeding continues. If it persists, please call [phone]. During nonoffice hours use the message of this number to contact the on-call dermatologist or go to the emergency room.  Infection: usually indicated by pain that increases 4 to 6 days after surgery. Mild redness, swelling, soreness are normal, but, with increasing redness, pain, drainage, and swelling, you should contact our office.  Decreased sensation, increased sensitivity, and itching may last for 18 months.  Scarring: scars mature for one year after surgery. Reducing motion and tension over the wound for the 1st month, as well as using sun protection, reduces scarring.  Bruising is normal around the wound and resolves over 2 to 3 weeks.  For wounds with dissolving stitches:  Please keep the original bandage in place for at least 48 hours. You can remove the large outer layer at that time and keep the small bandage clean and dry for one week or until you return. If the  bandage becomes soiled, wet, or detached, you can reinforce it with tape or add petrolatum  (Vaseline) to the stitches, then place a fresh, clean, nonstick bandage (telfa) with tape or use a non stick Band-Aid.  For wounds with stitches that need to be removed:  Please keep the original bandage in place for at least 48 hours. You can remove the large outer bandage at that time. You may leave the smaller bandage in place until it falls off by itself, or change it after it gets wet with bathing. To change, apply a layer of petrolatum (Vaseline) over top of the Steri-Strips (thin white stripes) and cover with a nonstick stick bandage (telfa) and  paper tape or use a non stick Band-Aid.  If you???re changing the bandage, you can gently remove any crust with warm water and mild soap using a soft  cloth a Q-tip. Do not use triple antibiotic ointment, Neosporin, or hydrogen peroxide.  After the stitches and bandages have been removed: clean daily with water with warm water and gentle soap using a soft cloth. You can continue to use a small bandage and sunscreen for one to 2 months or until fully healed.    If instructed to use vinegar soaks, use a fresh bottle of white vinegar and bottled water to make the soak  Please add 1 cup of white vinegar to a quart of room temperature water.  Soak affected area for 10-20 minutes once or twice a day.

## 2022-07-17 NOTE — Unmapped (Unsigned)
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   Site and procedure verified with patient/physician agreement and clinical photograph.   2 Health Care Workers involved in verification.   Consent form:.  Implantable device: no             Allergies to Anesthetics: no  Intolerance to Epinephrine: no       Anticoagulants: no   Major Medical Problems: Diabetes mellitus  Time: 4:00 PM    Personnel present during procedure/verification: Bella Kennedy, MD Erasmo Leventhal, MD   Operating surgeon (s): Bella Kennedy, MD     The size and nature of the excision as well as typical scarring were discussed along with, among others, the risks of bleeding, infection, cutaneous dysesthesia, positive margins, and lesion recurrence.  Written consent obtained/form signed.    EXCISION  Anesthesia: lidocaine 0.3% without epinephrine was infiltrated at the site with a total of 9 ml injected.   The surgical site was marked, cleansed with chlorhexidine x 2, and draped in a sterile fashion.      Measurements:  Tumor diameter: 9 mm     Total excision (tumor + margins) size:  9 mm.    Site was excised to the subcutaneous fat.    Undermining was performed, and hemostasis was accomplished with electrocautery and pressure.     CLOSURE  Linear layered closure performed to close potential space with:  Buried vertical mattress sutures:   5-0 Monocryl     Superficial suture:   Simple running  6-0 Fast absorbing gut    Length of closure: 33 mm    ANTIBIOTICS: None     DRESSING  A dry pressure dressing was applied to the wound site over petrolatum.   Wound healing expectations and the specifics of wound cleansing and bandaging were discussed in detail. The patient was given a handout reiterating instructions.     Suture removal plan: N/A - all dissolvable suture    RTC: No follow-ups on file.

## 2022-07-18 ENCOUNTER — Ambulatory Visit
Admission: RE | Admit: 2022-07-18 | Discharge: 2022-07-18 | Disposition: A | Discharge status: Home / Self Care | Payer: 59 | Source: Ambulatory Visit | Attending: Ophthalmology | Admitting: Ophthalmology

## 2022-07-18 DIAGNOSIS — E11628 Type 2 diabetes mellitus with other skin complications: Principal | ICD-10-CM

## 2022-07-18 DIAGNOSIS — Z794 Long term (current) use of insulin: Principal | ICD-10-CM

## 2022-07-18 DIAGNOSIS — H532 Diplopia: Secondary | ICD-10-CM | POA: Diagnosis not present

## 2022-07-18 DIAGNOSIS — H5711 Ocular pain, right eye: Secondary | ICD-10-CM | POA: Diagnosis not present

## 2022-07-18 DIAGNOSIS — H0520 Unspecified exophthalmos: Secondary | ICD-10-CM | POA: Insufficient documentation

## 2022-07-18 LAB — POCT I-STAT CREATININE: Creatinine, Ser: 1.1 mg/dL (ref 0.61–1.24)

## 2022-07-18 MED ORDER — INSULIN ASPART (U-100) 100 UNIT/ML (3 ML) SUBCUTANEOUS PEN
Freq: Three times a day (TID) | SUBCUTANEOUS | 3 refills | 90 days | Status: CP
Start: 2022-07-18 — End: 2023-07-18

## 2022-07-18 MED ORDER — IOHEXOL 300 MG/ML  SOLN
100.0000 mL | Freq: Once | INTRAMUSCULAR | Status: AC | PRN
  Administered 2022-07-18: 100 mL via INTRAVENOUS

## 2022-07-22 NOTE — Unmapped (Signed)
Result Note:    Specimen: A  Diagnosis:  Epidermal Inclusion Cyst  Site: right temple  Tracking:No.  Treatment plan:  observation  [id number]  Patient notified: [facility]

## 2022-07-24 ENCOUNTER — Ambulatory Visit: Admit: 2022-07-24 | Discharge: 2022-07-25 | Payer: MEDICARE

## 2022-07-24 DIAGNOSIS — E785 Hyperlipidemia, unspecified: Principal | ICD-10-CM

## 2022-07-24 DIAGNOSIS — E1169 Type 2 diabetes mellitus with other specified complication: Secondary | ICD-10-CM | POA: Diagnosis not present

## 2022-07-24 DIAGNOSIS — E1142 Type 2 diabetes mellitus with diabetic polyneuropathy: Secondary | ICD-10-CM | POA: Diagnosis not present

## 2022-07-24 DIAGNOSIS — E11628 Type 2 diabetes mellitus with other skin complications: Secondary | ICD-10-CM | POA: Diagnosis not present

## 2022-07-24 DIAGNOSIS — Z800 Family history of malignant neoplasm of digestive organs: Secondary | ICD-10-CM | POA: Diagnosis not present

## 2022-07-24 DIAGNOSIS — Z794 Long term (current) use of insulin: Secondary | ICD-10-CM | POA: Diagnosis not present

## 2022-07-24 DIAGNOSIS — Z1211 Encounter for screening for malignant neoplasm of colon: Secondary | ICD-10-CM | POA: Diagnosis not present

## 2022-07-24 DIAGNOSIS — I10 Essential (primary) hypertension: Secondary | ICD-10-CM | POA: Diagnosis not present

## 2022-07-24 DIAGNOSIS — Z86010 Personal history of colonic polyps: Secondary | ICD-10-CM | POA: Diagnosis not present

## 2022-07-24 MED ORDER — GABAPENTIN 800 MG TABLET
ORAL_TABLET | Freq: Four times a day (QID) | ORAL | 1 refills | 90 days | Status: CP
Start: 2022-07-24 — End: 2023-07-24

## 2022-07-24 NOTE — Unmapped (Signed)
Schedule colonoscopy   [phone]  Option 1  Option 2

## 2022-07-25 NOTE — Unmapped (Signed)
Called pt to follow up on his PA request for Cymbalta, pt did not answer phone I left pt a voice mail and notified him to contact Neurology to start his PA since they are the ones prescribing his medication.

## 2022-07-28 NOTE — Unmapped (Signed)
Assessment and Plan:     Cory Bentley was seen today for follow-up.    Diagnoses and all orders for this visit:    Primary hypertension    Pt BP today is 128/66   This is at goal   Pt denies CP, SHOB, DOE, palpitations, dizziness, HA's, vision changes   He is currently on hydrochlorothiazide 12.5mg  daily, lisinopril 40mg  daily   Will continue with this regimen     Type 2 diabetes mellitus with other skin complication, with long-term current use of insulin (CMS-HCC)    Pt last A1c 8.2  He reports home glucose average 150-170  Pt is currently on novolog averaging 8-10 units at meals - He reports that if BS below 200 he will take 8 units - And Levemir 52units daily   He is also on jardiance 20mg  daily and trulicity 0.75mg  weekly - He was to increase trulicity to 1.5mg  weekly after 4 weeks     He last had an routine eye exam at [facility] in 02/2022 - He states he gets injections Q4 weeks in his left eye and 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 - However, over the last month, he has had double vision in the right eye and went for urgent eye exam - 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    He is followed by Endo with last appt 07/04/2022    Diabetic polyneuropathy associated with type 2 diabetes mellitus (CMS-HCC)    Pt is currently on gabapentin 800mg  TID and cymbalta 40mg  daily and reports that he self increased his dose of gabapentin from 800mg  three times a day to four times a day   He has been taking 1600mg  twice a day   I discussed with him that this exceeded the dosing at one time and that he should take 800mg  four times a day   He states with the increased dosing his neuropathy, especially in his fingers, is greatly improved  Will continue with this dosing     He is also followed by Neurology with last appt 05/21/2022    -     gabapentin (NEURONTIN) 800 MG tablet; Take 1 tablet (800 mg total) by mouth four (4) times a day.    Hyperlipidemia associated with type 2 diabetes mellitus (CMS-HCC)    Pt last lipid panel 08/2021  Triglycerides 73, cholesterol 119, HDL 44, LDL 60  He is currently on atorvastatin 40mg  daily   He denies any SE from statin therapy   Will continue on this dosing and recheck lipid at PE in 3-4 months     Screening for colon cancer  -     Colonoscopy; Future    History of colonic polyps  -     Colonoscopy; Future    Family history of colon cancer  -     Colonoscopy; Future    Other orders  -     MyChart BP Flowsheet        I personally spent 30 minutes face-to-face and non-face-to-face in the care of this patient, which includes all pre, intra, and post visit time on the date of service.  All documented time was specific to the E/M visit and does not include any procedures that may have been performed.       Return in about 3 months (around 10/23/2022) for Annual physical.    Subjective:     HPI: Cory Bentley is a 62 y.o. male here for  Chief Complaint   Patient presents with    Follow-up     HTN   Having double vision in right eye for a week seeing [facility]   Had cyst removal last week    :    Cory Bentley was seen today for follow-up.    Diagnoses and all orders for this visit:    Primary hypertension    Pt BP today is 128/66   This is at goal   Pt denies CP, SHOB, DOE, palpitations, dizziness, HA's, vision changes   He is currently on hydrochlorothiazide 12.5mg  daily, lisinopril 40mg  daily   Will continue with this regimen     Type 2 diabetes mellitus with other skin complication, with long-term current use of insulin (CMS-HCC)    Pt last A1c 8.2  He reports home glucose average 150-170  Pt is currently on novolog averaging 8-10 units at meals - He reports that if BS below 200 he will take 8 units - And Levemir 52units daily   He is also on jardiance 20mg  daily and trulicity 0.75mg  weekly - He was to increase trulicity to 1.5mg  weekly after 4 weeks     He last had an routine eye exam at [facility] in 02/2022 - He states he gets injections Q4 weeks in his left eye and 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 - However, over the last month, he has had double vision in the right eye and went for urgent eye exam - 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    He is followed by Endo with last appt 07/04/2022    Diabetic polyneuropathy associated with type 2 diabetes mellitus (CMS-HCC)    Pt is currently on gabapentin 800mg  TID and cymbalta 40mg  daily and reports that he self increased his dose of gabapentin from 800mg  three times a day to four times a day   He has been taking 1600mg  twice a day   I discussed with him that this exceeded the dosing at one time and that he should take 800mg  four times a day   He states with the increased dosing his neuropathy, especially in his fingers, is greatly improved  Will continue with this dosing     He is also followed by Neurology with last appt 05/21/2022    -     gabapentin (NEURONTIN) 800 MG tablet; Take 1 tablet (800 mg total) by mouth four (4) times a day.    Hyperlipidemia associated with type 2 diabetes mellitus (CMS-HCC)    Pt last lipid panel 08/2021  Triglycerides 73, cholesterol 119, HDL 44, LDL 60  He is currently on atorvastatin 40mg  daily   He denies any SE from statin therapy   Will continue on this dosing and recheck lipid at PE in 3-4 months     Screening for colon cancer  -     Colonoscopy; Future          ROS:   Review of Systems   Constitutional:  Negative for activity change, appetite change, fatigue and unexpected weight change.   Eyes:  Positive for visual disturbance.   Respiratory:  Negative for chest tightness and shortness of breath.    Cardiovascular:  Negative for chest pain and palpitations.   Gastrointestinal:  Negative for abdominal pain, constipation, diarrhea, nausea and vomiting.   Musculoskeletal:  Positive for gait problem.   Skin:  Negative for rash.   Neurological:  Negative for dizziness, light-headedness  and headaches. Psychiatric/Behavioral:  Negative for sleep disturbance. The patient is not nervous/anxious.         Review of systems negative unless otherwise noted as per HPI    Objective:     Visit Vitals  BP 128/66   Pulse 82   Temp 36.4 ??C (97.5 ??F) (Temporal)   Ht 172.7 cm (5' 8)   Wt 92.1 kg (203 lb)   SpO2 98%   BMI 30.87 kg/m??          07/24/22 1020   PainSc: 0-No pain        Physical Exam  Constitutional:       General: He is not in acute distress.     Appearance: Normal appearance. He is well-developed and well-groomed. He is not ill-appearing.   HENT:      Head: Normocephalic and atraumatic.      Mouth/Throat:      Lips: Pink.      Mouth: Mucous membranes are moist.   Eyes:      Conjunctiva/sclera: Conjunctivae normal.   Cardiovascular:      Rate and Rhythm: Normal rate and regular rhythm.      Heart sounds: Normal heart sounds, S1 normal and S2 normal.   Pulmonary:      Effort: Pulmonary effort is normal.      Breath sounds: Normal breath sounds and air entry.   Abdominal:      General: Bowel sounds are normal.      Palpations: Abdomen is soft.      Tenderness: There is no abdominal tenderness.   Skin:     General: Skin is warm and dry.   Neurological:      Mental Status: He is alert and oriented to person, place, and time.      Gait: Gait abnormal.   Psychiatric:         Attention and Perception: Attention and perception normal.         Mood and Affect: Mood and affect normal.         Speech: Speech normal.         Behavior: Behavior normal. Behavior is cooperative.         Thought Content: Thought content normal.          [facility]:     Medication adherence and barriers to the treatment plan have been addressed. Opportunities to optimize healthy behaviors have been discussed. Patient / caregiver voiced understanding.

## 2022-07-29 DIAGNOSIS — E113312 Type 2 diabetes mellitus with moderate nonproliferative diabetic retinopathy with macular edema, left eye: Secondary | ICD-10-CM | POA: Diagnosis not present

## 2022-08-05 NOTE — Unmapped (Signed)
RHEUMATOLOGY NEW CLINIC NOTE    Assessment/Plan: Cory Bentley is a 62 y.o. male here for evaluation of hand and feet symptoms.  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  His diabetes had been uncontrolled for years though is better controlled recently.  He also has carpal tunnel syndrome.  We talked about a referral to occupational therapy for these issues and he will take the referral to his local office.  I see no indication for any immunosuppression at this time.  Cory Bentley is working well for his psoriasis and I do not see any inflammatory arthritis as a potential cause of his symptoms.  If the contractures progress he should be referred to orthopedic surgery.  If the carpal tunnel symptoms's progress he should also be referred to orthopedic surgery.      History of Present Illness:  The patient was seen in consultation at the request of Cory Bentley  for the evaluation of psoriasis and joint pains.    Primary Care Provider: Amie Portland, FNP    Cory Bentley is a 62 y.o.  male with psoriasis on Stelara for a little over a year and his skin is doing well, followed by Dermatology. 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 Had prior surgeries on the left thumb and right 5th finger from infections.  He has a chronic contracture of the right fifth digit.  Was seen by hand specialist at [facility] for question of amputation of the right fifth finger.  She thought this was not possible because there would not be enough skin to close the wound.  He has lost the dexterity in his hands overall. When he sleeps at night and wakes up in the AM his hands are swollen. No recent hand xrays. Last several A1C were 8.1, 7.1, 14.  Diabetes was uncontrolled for years.    He reports that he has never been to hand therapy for his symptoms.    Allergies:  Patient has no known allergies.    Medications:   Outpatient Medications Prior to Visit Medication Sig Dispense Refill    alcohol swabs (ALCOHOL PADS) PadM Apply 1 swab topically four (4) times a day. 400 each 1    atorvastatin (LIPITOR) 40 MG tablet Take 1 tablet (40 mg total) by mouth daily. 90 tablet 1    BD ULTRA-FINE MINI PEN NEEDLE 31 gauge x 3/16 (5 mm) Ndle INJECT FOUR TIMES DAILY 400 each 1    betamethasone valerate (VALISONE) 0.1 % ointment APPLY TO THICK AREAS OF PSORIASIS TWICE DAILY AS NEEDED UNTIL SMOOTH AVOID FACE AND SKIN FOLDS 45 g 8    blood sugar diagnostic Strp Accu Check Aviva Strip, use 4 times per day for monitoring insulin dependant uncontrolled diabetes      blood-glucose meter kit Use as instructed 1 each 0    diaper,brief,adult,disposable Misc 1 each by Miscellaneous route two (2) times a day. 20 each 2    DULoxetine (CYMBALTA) 20 MG capsule       empagliflozin (JARDIANCE) 10 mg tablet Take 2 tablets (20 mg total) by mouth daily. 180 tablet 1    empty container Misc Use as directed to dispose of Stelara syringes. 1 each 2    flash glucose sensor (FREESTYLE LIBRE 14 DAY SENSOR) kit Apply new sensor every 14 days and use as directed to monitor and check blood sugars 2 each 12    gabapentin (NEURONTIN) 800 MG tablet Take 1 tablet (  800 mg total) by mouth four (4) times a day. 360 tablet 1    hydroCHLOROthiazide (MICROZIDE) 12.5 mg capsule Take 1 capsule (12.5 mg total) by mouth daily. 90 capsule 3    hydrocortisone 2.5 % ointment APPLY TO PSORIASIS IN EARS UP TO TWICE DAILY AS NEEDED UNTIL SMOOTH 28.35 g 0    insulin aspart (NOVOLOG FLEXPEN U-100 INSULIN) 100 unit/mL (3 mL) injection pen Inject 0.1 mL (10 Units total) under the skin Three (3) times a day before meals. 27 mL 3    insulin detemir U-100 (LEVEMIR FLEXPEN) 100 unit/mL (3 mL) injection pen Inject 0.52 mL (52 Units total) under the skin daily. 45 mL 1    lisinopriL (PRINIVIL,ZESTRIL) 40 MG tablet Take 1 tablet (40 mg total) by mouth daily. 90 tablet 1    omeprazole (PRILOSEC) 20 MG capsule Take 1 capsule (20 mg total) by mouth daily. TAKE 1 CAPSULE(20 MG) BY MOUTH DAILY 90 capsule 1    testosterone 20.25 mg/1.25 gram (1.62 %) gel pump Place 2 sprays on the skin daily. 3.65 g 0    TRULICITY 0.75 mg/0.5 mL injection pen 0.5 mL (0.75 mg total).      ustekinumab (STELARA) 45 mg/0.5 mL Syrg syringe Inject the contents of 1 syringe (45 mg total) under the skin every 12 weeks. 2 mL 3     No facility-administered medications prior to visit.     Medical History:  Past Medical History:   Diagnosis Date    Cellulitis of left foot 07/24/2021    Diabetes mellitus (CMS-HCC)     Hypertension     Psoriasis     Substance abuse (CMS-HCC)      Surgical History:  Past Surgical History:   Procedure Laterality Date    PR ANESTH,CLEFT LIP REPAIR Right 04/20/2018    Procedure: DEBRIDEMENT; SKIN, SUBCUTANEOUS TISSUE, & MUSCLE HAND and FINGER wounds;  Surgeon: Cory Sharper, MD;  Location: MAIN OR [place];  Service: Plastics    PR APPLY FOREARM SPLINT,STATIC Right 05/05/2018    Procedure: application right short arm ulnar gutter splint;  Surgeon: Cory Sharper, MD;  Location: MAIN OR [place];  Service: Plastics    PR APPLY FOREARM SPLINT,STATIC Right 05/11/2018    Procedure: Applic Short Arm Splint; Static;  Surgeon: Cory Sharper, MD;  Location: MAIN OR [place];  Service: Plastics    PR DEBRIDEMENT MUSCLE &/FASCIA 1ST 20 SQ CM/< Right 04/27/2018    Procedure: debridement right palmar wound and dorsal right small finger wound;  Surgeon: Cory Sharper, MD;  Location: MAIN OR [place];  Service: Plastics    PR FTH/GF FR W/DIR CLSR F/C/C/M/N/AX/G/H/F 20SQCM/< Right 05/11/2018    Procedure: Full thickness skin graft to right palmar hand and right small finger wounds;  Surgeon: Cory Sharper, MD;  Location: MAIN OR [place];  Service: Plastics    PR NEGATIVE PRESSURE WOUND THERAPY DME <= 50 SQ CM Right 04/22/2018    Procedure: Right Hand Wound Vac Change;  Surgeon: Cory Newport, MD;  Location: MAIN OR ;  Service: Plastics    PR SUB GRFT F/S/N/H/F/G/M/D /<100SCM /<1ST 25 SCM Right 05/05/2018    Procedure: application allograft human skin to right hand and right small finger wounds;  Surgeon: Cory Sharper, MD;  Location: [facility];  Service: Plastics    PR WOUND PREP, PED, TRK/ARM/LG 1ST 100 CM Right 05/05/2018    Procedure: Debridement right hand and small finger wounds in preparation for skin grafting;  Surgeon: Williams Che  Chapman Fitch, MD;  Location: MAIN OR [place];  Service: Plastics    PR WOUND PREP, PED, TRK/ARM/LG 1ST 100 CM Right 05/11/2018    Procedure: Surg Preparation/Creation Of Recipient Site By Excision Of Open Wound/Burn Eschar/Scar, Or Incisional Release Of Scar Contracture, Trunk/Arms/Legs; 1st 100 Sq Cm Or 1% Body Area Of Infants & Children;  Surgeon: Cory Sharper, MD;  Location: MAIN OR ;  Service: Plastics    Right toe amputation       Social History:  Social History     Tobacco Use    Smoking status: Never     Passive exposure: Never    Smokeless tobacco: Never   Vaping Use    Vaping status: Never Used   Substance Use Topics    Alcohol use: Yes     Comment: 2 beers/day average    Drug use: Yes     Frequency: 7.0 times per week     Types: Marijuana     Comment: h/o cocaine abuse; daily marijuana     Family History:  Family History   Problem Relation Age of Onset    Diabetes Mother     Diabetes type II Mother     Ovarian cancer Mother     Diabetes Father     Diabetes type II Father     Diabetes type II Sister     Diabetes type II Brother     Diabetes type II Maternal Grandmother     Diabetes type II Maternal Grandfather     Diabetes type II Paternal Grandmother     Diabetes type II Paternal Grandfather     Melanoma Neg Hx     Basal cell carcinoma Neg Hx     Squamous cell carcinoma Neg Hx      Sister with RA, smoker    Review of Systems: Please see above in the HPI, the remainder of a 10-system review was unremarkable.    Review of outside records: I have reviewed available records through Epic.     Objective   Vitals: 08/08/22 0814   BP: 101/74   Pulse: 91   Temp: 35.9 ??C (96.6 ??F)   TempSrc: Temporal   Weight: 88.8 kg (195 lb 12.8 oz)   Height: 172.7 cm (5' 8)     Body mass index is 29.77 kg/m??.  Physical Exam    General:   Well appearing male in no acute distress   Eyes:   PERRL, EOMI, conjunctivae are clear.   ENT:   MMM. Oropharhynx without any erythema or exudate.   Skin:    No diffuse rash.   Psychiatry:   Alert and oriented to person, place, and time    Abdomen:   Normoactive bowel sounds, abdomen soft, non-tender and not distended, no hepatosplenomegaly.   Extremities:   Warm and well perfused. No edema. 2+ pulses.   Musculo Skeletal:   Hands: No swelling in the MCPs, PIPs, or DIPs.  No dactylitis.  There is chronic flexion deformity of the right fifth finger at the MCP and PIP.  Chronic deformity of the left thumb IP joint.  Dupuytren's contractures are present in the second through fifth fingers of both hands bilaterally.  I also think there is an element of diabetic Cheiroarthropathy present.  Wrists no synovitis, full range of motion  Elbows: Full range of motion  Shoulders: Full range of motion  Knees: Cool, no effusions, full range of motion  Ankles: No swelling of the Achilles, no effusions, full range of  motion  Feet: No MTP tenderness, status post amputation of the right second toe, well-healed, no dactylitis, no MTP compression tenderness

## 2022-08-08 ENCOUNTER — Ambulatory Visit: Admit: 2022-08-08 | Discharge: 2022-08-09 | Payer: MEDICARE | Attending: Internal Medicine | Primary: Internal Medicine

## 2022-08-08 DIAGNOSIS — E11618 Type 2 diabetes mellitus with other diabetic arthropathy: Principal | ICD-10-CM

## 2022-08-08 DIAGNOSIS — L409 Psoriasis, unspecified: Secondary | ICD-10-CM | POA: Diagnosis not present

## 2022-08-08 DIAGNOSIS — M720 Palmar fascial fibromatosis [Dupuytren]: Secondary | ICD-10-CM | POA: Diagnosis not present

## 2022-08-08 DIAGNOSIS — G5603 Carpal tunnel syndrome, bilateral upper limbs: Secondary | ICD-10-CM | POA: Diagnosis not present

## 2022-08-15 DIAGNOSIS — E11628 Type 2 diabetes mellitus with other skin complications: Secondary | ICD-10-CM | POA: Diagnosis not present

## 2022-08-19 DIAGNOSIS — R262 Difficulty in walking, not elsewhere classified: Secondary | ICD-10-CM | POA: Diagnosis not present

## 2022-08-19 DIAGNOSIS — R202 Paresthesia of skin: Secondary | ICD-10-CM | POA: Diagnosis not present

## 2022-08-19 DIAGNOSIS — R2689 Other abnormalities of gait and mobility: Secondary | ICD-10-CM | POA: Diagnosis not present

## 2022-08-19 DIAGNOSIS — R200 Anesthesia of skin: Secondary | ICD-10-CM | POA: Diagnosis not present

## 2022-08-19 DIAGNOSIS — G629 Polyneuropathy, unspecified: Secondary | ICD-10-CM | POA: Diagnosis not present

## 2022-08-20 DIAGNOSIS — G5603 Carpal tunnel syndrome, bilateral upper limbs: Secondary | ICD-10-CM | POA: Diagnosis not present

## 2022-08-20 DIAGNOSIS — M24541 Contracture, right hand: Secondary | ICD-10-CM | POA: Diagnosis not present

## 2022-08-21 DIAGNOSIS — E1140 Type 2 diabetes mellitus with diabetic neuropathy, unspecified: Secondary | ICD-10-CM | POA: Diagnosis not present

## 2022-08-21 DIAGNOSIS — E1169 Type 2 diabetes mellitus with other specified complication: Secondary | ICD-10-CM | POA: Diagnosis not present

## 2022-08-21 DIAGNOSIS — E1165 Type 2 diabetes mellitus with hyperglycemia: Secondary | ICD-10-CM | POA: Diagnosis not present

## 2022-08-21 DIAGNOSIS — E1159 Type 2 diabetes mellitus with other circulatory complications: Secondary | ICD-10-CM | POA: Diagnosis not present

## 2022-08-21 DIAGNOSIS — I152 Hypertension secondary to endocrine disorders: Secondary | ICD-10-CM | POA: Diagnosis not present

## 2022-08-21 DIAGNOSIS — Z794 Long term (current) use of insulin: Secondary | ICD-10-CM | POA: Diagnosis not present

## 2022-08-21 DIAGNOSIS — E785 Hyperlipidemia, unspecified: Secondary | ICD-10-CM | POA: Diagnosis not present

## 2022-08-26 DIAGNOSIS — G5603 Carpal tunnel syndrome, bilateral upper limbs: Secondary | ICD-10-CM | POA: Diagnosis not present

## 2022-08-26 DIAGNOSIS — M720 Palmar fascial fibromatosis [Dupuytren]: Secondary | ICD-10-CM | POA: Diagnosis not present

## 2022-08-28 DIAGNOSIS — E113312 Type 2 diabetes mellitus with moderate nonproliferative diabetic retinopathy with macular edema, left eye: Secondary | ICD-10-CM | POA: Diagnosis not present

## 2022-09-02 MED ORDER — LISINOPRIL 30 MG TABLET
ORAL_TABLET | ORAL | 1 refills | 0 days
Start: 2022-09-02 — End: ?

## 2022-09-02 NOTE — Unmapped (Signed)
[facility] Refill Coordination Note    Specialty Medication(s) to be Shipped:   Inflammatory Disorders: Stelara    Other medication(s) to be shipped: No additional medications requested for fill at this time     Cory Bentley, DOB: [birth year]  Phone: There are no phone numbers on file.      All above HIPAA information was verified with patient.     Was a Nurse, learning disability used for this call? No    Completed refill call assessment today to schedule patient's medication shipment from the [facility] [phone]).  All relevant notes have been reviewed.     Specialty medication(s) and dose(s) confirmed: Regimen is correct and unchanged.   Changes to medications: Cory Bentley reports no changes at this time.  Changes to insurance: Yes: Noted in [place]  New side effects reported not previously addressed with a pharmacist or physician: None reported  Questions for the pharmacist: No    Confirmed patient received a Conservation officer, historic buildings and a Surveyor, mining with first shipment. The patient will receive a drug information handout for each medication shipped and additional FDA Medication Guides as required.       DISEASE/MEDICATION-SPECIFIC INFORMATION        For patients on injectable medications: Patient currently has 0 doses left.  Next injection is scheduled for 5/23.    SPECIALTY MEDICATION ADHERENCE     Medication Adherence    Patient reported X missed doses in the last month: 0  Specialty Medication: STELARA 45 mg/0.5 mL Syrg syringe (ustekinumab)  Patient is on additional specialty medications: No  Informant: patient              Were doses missed due to medication being on hold? No    Stelara 45/0.5 mg/ml: 0 days of medicine on hand       REFERRAL TO PHARMACIST     Referral to the pharmacist: Not needed      [facility]     Shipping address confirmed in Epic.     Patient was notified of new phone menu : No    Delivery Scheduled: Yes, Expected medication delivery date: 5/21.     Medication will be delivered via Same Day Courier to the prescription address in Epic WAM.    [name]   [facility] Specialty Technician

## 2022-09-03 DIAGNOSIS — M24541 Contracture, right hand: Secondary | ICD-10-CM | POA: Diagnosis not present

## 2022-09-03 DIAGNOSIS — G5603 Carpal tunnel syndrome, bilateral upper limbs: Secondary | ICD-10-CM | POA: Diagnosis not present

## 2022-09-03 DIAGNOSIS — M720 Palmar fascial fibromatosis [Dupuytren]: Secondary | ICD-10-CM | POA: Diagnosis not present

## 2022-09-03 MED ORDER — LISINOPRIL 40 MG TABLET
ORAL_TABLET | Freq: Every day | ORAL | 1 refills | 90 days | Status: CP
Start: 2022-09-03 — End: 2023-09-03

## 2022-09-03 MED ORDER — LISINOPRIL 30 MG TABLET
ORAL_TABLET | ORAL | 1 refills | 0 days
Start: 2022-09-03 — End: ?

## 2022-09-03 MED FILL — STELARA 45 MG/0.5 ML SUBCUTANEOUS SYRINGE: 84 days supply | Qty: 0.5 | Fill #2

## 2022-09-03 NOTE — Unmapped (Signed)
Patient is requesting the following refill  Requested Prescriptions     Pending Prescriptions Disp Refills    lisinopril (PRINIVIL,ZESTRIL) 40 MG tablet 90 tablet 1     Sig: Take 1 tablet (40 mg total) by mouth daily.     Refused Prescriptions Disp Refills    lisinopril (PRINIVIL,ZESTRIL) 30 MG tablet [Pharmacy Med Name: LISINOPRIL 30MG  TABLETS] 90 tablet 1     Sig: TAKE 1 TABLET(30 MG) BY MOUTH DAILY       Recent Visits  Date Type Provider Dept   07/24/22 Office Visit Coralee North, Magnus Ivan, FNP [place] [facility] [address] [facility]   04/24/22 Office Visit Clapp, Magnus Ivan, FNP [place] [facility] [address] [facility]   03/13/22 Office Visit Clapp, Magnus Ivan, FNP [place] [facility] [address] [facility]   12/06/21 Office Visit Clapp, Magnus Ivan, FNP [place] [facility] [address] At [facility]   11/23/21 Office Visit Clapp, Magnus Ivan, FNP [place] [facility] [address] At [facility]   09/05/21 Office Visit Clapp, Magnus Ivan, FNP [place] [facility] [address] At [facility]   Showing recent visits within past 365 days with a meds authorizing provider and meeting all other requirements  Future Appointments  Date Type Provider Dept   10/23/22 Appointment Coralee North, Magnus Ivan, FNP [place] [facility] [address] At [facility]   Showing future appointments within next 365 days with a meds authorizing provider and meeting all other requirements       Labs: Creatinine:   Creatinine (mg/dL)   Date Value   16/01/9603 0.95   04/13/2020 1.0    Potassium:   Potassium (mmol/L)   Date Value   03/13/2022 4.8   04/13/2020 5.4 (H)    Sodium:   Sodium (mmol/L)   Date Value   03/13/2022 140   04/13/2020 136

## 2022-09-05 DIAGNOSIS — K219 Gastro-esophageal reflux disease without esophagitis: Principal | ICD-10-CM

## 2022-09-05 MED ORDER — OMEPRAZOLE 20 MG CAPSULE,DELAYED RELEASE
ORAL_CAPSULE | Freq: Every day | ORAL | 1 refills | 90 days | Status: CP
Start: 2022-09-05 — End: ?

## 2022-09-10 DIAGNOSIS — M24541 Contracture, right hand: Secondary | ICD-10-CM | POA: Diagnosis not present

## 2022-09-10 DIAGNOSIS — G5603 Carpal tunnel syndrome, bilateral upper limbs: Secondary | ICD-10-CM | POA: Diagnosis not present

## 2022-09-10 DIAGNOSIS — M720 Palmar fascial fibromatosis [Dupuytren]: Secondary | ICD-10-CM | POA: Diagnosis not present

## 2022-09-12 DIAGNOSIS — I10 Essential (primary) hypertension: Principal | ICD-10-CM

## 2022-09-12 MED ORDER — LISINOPRIL 40 MG TABLET
ORAL_TABLET | Freq: Every day | ORAL | 1 refills | 90 days
Start: 2022-09-12 — End: ?

## 2022-09-14 DIAGNOSIS — E11628 Type 2 diabetes mellitus with other skin complications: Secondary | ICD-10-CM | POA: Diagnosis not present

## 2022-09-17 DIAGNOSIS — E1169 Type 2 diabetes mellitus with other specified complication: Principal | ICD-10-CM

## 2022-09-17 DIAGNOSIS — G5603 Carpal tunnel syndrome, bilateral upper limbs: Secondary | ICD-10-CM | POA: Diagnosis not present

## 2022-09-17 DIAGNOSIS — M720 Palmar fascial fibromatosis [Dupuytren]: Secondary | ICD-10-CM | POA: Diagnosis not present

## 2022-09-17 DIAGNOSIS — M24541 Contracture, right hand: Secondary | ICD-10-CM | POA: Diagnosis not present

## 2022-09-17 DIAGNOSIS — E785 Hyperlipidemia, unspecified: Principal | ICD-10-CM

## 2022-09-17 MED ORDER — ATORVASTATIN 40 MG TABLET
ORAL_TABLET | Freq: Every day | ORAL | 0 refills | 90 days
Start: 2022-09-17 — End: 2023-09-16

## 2022-09-17 NOTE — Unmapped (Signed)
Patient is requesting the following refill  Requested Prescriptions     Pending Prescriptions Disp Refills    atorvastatin (LIPITOR) 40 MG tablet [Pharmacy Med Name: ATORVASTATIN 40MG  TABLETS] 90 tablet 1     Sig: TAKE 1 TABLET(40 MG) BY MOUTH DAILY       Recent Visits  Date Type Provider Dept   07/24/22 Office Visit Amie Portland, FNP [place] [facility] [address] [facility]   04/24/22 Office Visit Clapp, Magnus Ivan, FNP [place] [facility] [address] [facility]   03/13/22 Office Visit Clapp, Magnus Ivan, FNP [place] [facility] [address] [facility]   12/06/21 Office Visit Clapp, Magnus Ivan, FNP [place] [facility] [address] At [facility]   11/23/21 Office Visit Clapp, Magnus Ivan, FNP [place] [facility] [address] At [facility]   Showing recent visits within past 365 days with a meds authorizing provider and meeting all other requirements  Future Appointments  Date Type Provider Dept   10/23/22 Appointment Coralee North, Magnus Ivan, FNP [place] [facility] [address] At [facility]   Showing future appointments within next 365 days with a meds authorizing provider and meeting all other requirements       Labs: Cholesterol:   Cholesterol (mg/dL)   Date Value   46/96/2952 119   01/27/2020 191   ,   Triglycerides (mg/dL)   Date Value   84/13/2440 73   01/27/2020 120   ,   HDL (mg/dL)   Date Value   02/09/2535 44   01/27/2020 49   ,   LDL Calculated (mg/dL)   Date Value   64/40/3474 60     LDL Direct (mg/dl)   Date Value   25/95/6387 118 (H)       Last Lipids 09/05/2021.

## 2022-09-19 DIAGNOSIS — Z89421 Acquired absence of other right toe(s): Secondary | ICD-10-CM | POA: Diagnosis not present

## 2022-09-19 DIAGNOSIS — L97521 Non-pressure chronic ulcer of other part of left foot limited to breakdown of skin: Secondary | ICD-10-CM | POA: Diagnosis not present

## 2022-09-19 DIAGNOSIS — E1142 Type 2 diabetes mellitus with diabetic polyneuropathy: Secondary | ICD-10-CM | POA: Diagnosis not present

## 2022-09-19 MED ORDER — ATORVASTATIN 40 MG TABLET
ORAL_TABLET | Freq: Every day | ORAL | 0 refills | 90 days | Status: CP
Start: 2022-09-19 — End: 2023-09-18

## 2022-09-27 DIAGNOSIS — E113312 Type 2 diabetes mellitus with moderate nonproliferative diabetic retinopathy with macular edema, left eye: Secondary | ICD-10-CM | POA: Diagnosis not present

## 2022-10-14 DIAGNOSIS — E11628 Type 2 diabetes mellitus with other skin complications: Secondary | ICD-10-CM | POA: Diagnosis not present

## 2022-10-15 NOTE — Unmapped (Signed)
Called patient to reschedule Physical due [name] not in the clinic.  He rescheduled for first available in August and stated he will need refills on all his medications before his next appt.  Thanks

## 2022-10-16 MED ORDER — EMPAGLIFLOZIN 10 MG TABLET
ORAL_TABLET | Freq: Every day | ORAL | 0 refills | 45 days
Start: 2022-10-16 — End: ?

## 2022-10-16 MED ORDER — LEVEMIR FLEXPEN 100 UNIT/ML (3 ML) SOLUTION SUBCUTANEOUS INSULIN PEN
Freq: Every day | SUBCUTANEOUS | 0 refills | 86 days
Start: 2022-10-16 — End: 2023-10-16

## 2022-10-16 NOTE — Unmapped (Addendum)
Patients appt is 11/29/2022. I only see 2 medications that would need to be refilled. Most of them have been filled recently with refills.

## 2022-10-16 NOTE — Unmapped (Signed)
Per [name].    Yes 30d supply is fine with the exception of Stellera as I do not prescribe this for him   Thanks   [name]

## 2022-10-16 NOTE — Unmapped (Signed)
Addended by: Bobby Rumpf C on: 10/16/2022 04:53 PM     Modules accepted: Orders

## 2022-10-18 MED ORDER — EMPAGLIFLOZIN 10 MG TABLET
ORAL_TABLET | Freq: Every day | ORAL | 0 refills | 45 days | Status: CP
Start: 2022-10-18 — End: ?

## 2022-10-18 MED ORDER — LEVEMIR FLEXPEN 100 UNIT/ML (3 ML) SOLUTION SUBCUTANEOUS INSULIN PEN
Freq: Every day | SUBCUTANEOUS | 0 refills | 86 days | Status: CP
Start: 2022-10-18 — End: 2023-10-18

## 2022-10-25 DIAGNOSIS — E113312 Type 2 diabetes mellitus with moderate nonproliferative diabetic retinopathy with macular edema, left eye: Secondary | ICD-10-CM | POA: Diagnosis not present

## 2022-11-13 DIAGNOSIS — E11628 Type 2 diabetes mellitus with other skin complications: Secondary | ICD-10-CM | POA: Diagnosis not present

## 2022-11-15 NOTE — Unmapped (Signed)
[facility] Refill Coordination Note    Specialty Medication(s) to be Shipped:   Inflammatory Disorders: Stelara    Other medication(s) to be shipped: No additional medications requested for fill at this time     Cory Bentley, DOB: Dec 17, 1960  Phone: There are no phone numbers on file.      All above HIPAA information was verified with patient.     Was a Nurse, learning disability used for this call? No    Completed refill call assessment today to schedule patient's medication shipment from the [facility] [phone]).  All relevant notes have been reviewed.     Specialty medication(s) and dose(s) confirmed: Regimen is correct and unchanged.   Changes to medications: Cory Bentley reports no changes at this time.  Changes to insurance: No  New side effects reported not previously addressed with a pharmacist or physician: None reported  Questions for the pharmacist: No    Confirmed patient received a Conservation officer, historic buildings and a Surveyor, mining with first shipment. The patient will receive a drug information handout for each medication shipped and additional FDA Medication Guides as required.       DISEASE/MEDICATION-SPECIFIC INFORMATION        For patients on injectable medications: Patient currently has 0 doses left.  Next injection is scheduled for 8/8.    SPECIALTY MEDICATION ADHERENCE     Medication Adherence    Patient reported X missed doses in the last month: 0  Specialty Medication: STELARA 45 mg/0.5 mL              Were doses missed due to medication being on hold? No    STELARA 45 mg/0.5 mL   : 0 days of medicine on hand       REFERRAL TO PHARMACIST     Referral to the pharmacist: Not needed      [facility]     Shipping address confirmed in Epic.       Delivery Scheduled: Yes, Expected medication delivery date: 8/8.     Medication will be delivered via Same Day Courier to the prescription address in Epic WAM.    Westley Gambles   [facility] Specialty Technician

## 2022-11-19 DIAGNOSIS — M21372 Foot drop, left foot: Secondary | ICD-10-CM | POA: Diagnosis not present

## 2022-11-19 DIAGNOSIS — R262 Difficulty in walking, not elsewhere classified: Secondary | ICD-10-CM | POA: Diagnosis not present

## 2022-11-19 DIAGNOSIS — M21371 Foot drop, right foot: Secondary | ICD-10-CM | POA: Diagnosis not present

## 2022-11-19 DIAGNOSIS — G629 Polyneuropathy, unspecified: Secondary | ICD-10-CM | POA: Diagnosis not present

## 2022-11-19 DIAGNOSIS — R55 Syncope and collapse: Secondary | ICD-10-CM | POA: Diagnosis not present

## 2022-11-19 DIAGNOSIS — R200 Anesthesia of skin: Secondary | ICD-10-CM | POA: Diagnosis not present

## 2022-11-19 DIAGNOSIS — R202 Paresthesia of skin: Secondary | ICD-10-CM | POA: Diagnosis not present

## 2022-11-19 DIAGNOSIS — R2689 Other abnormalities of gait and mobility: Secondary | ICD-10-CM | POA: Diagnosis not present

## 2022-11-19 DIAGNOSIS — E538 Deficiency of other specified B group vitamins: Secondary | ICD-10-CM | POA: Diagnosis not present

## 2022-11-21 ENCOUNTER — Other Ambulatory Visit: Payer: Self-pay | Admitting: Physician Assistant

## 2022-11-21 DIAGNOSIS — R55 Syncope and collapse: Secondary | ICD-10-CM

## 2022-11-21 MED FILL — STELARA 45 MG/0.5 ML SUBCUTANEOUS SYRINGE: 84 days supply | Qty: 0.5 | Fill #3

## 2022-11-22 DIAGNOSIS — E113312 Type 2 diabetes mellitus with moderate nonproliferative diabetic retinopathy with macular edema, left eye: Secondary | ICD-10-CM | POA: Diagnosis not present

## 2022-11-25 DIAGNOSIS — R7989 Other specified abnormal findings of blood chemistry: Principal | ICD-10-CM

## 2022-11-25 MED ORDER — TESTOSTERONE 20.25 MG/1.25 GRAM PER PUMP ACT.(1.62 %) TRANSDERMAL GEL
Freq: Every day | TRANSDERMAL | 0 refills | 0 days
Start: 2022-11-25 — End: ?

## 2022-11-26 ENCOUNTER — Other Ambulatory Visit: Payer: Self-pay | Admitting: Internal Medicine

## 2022-11-26 DIAGNOSIS — I10 Essential (primary) hypertension: Secondary | ICD-10-CM | POA: Diagnosis not present

## 2022-11-26 DIAGNOSIS — R0789 Other chest pain: Secondary | ICD-10-CM | POA: Diagnosis not present

## 2022-11-26 DIAGNOSIS — R55 Syncope and collapse: Secondary | ICD-10-CM | POA: Diagnosis not present

## 2022-11-26 DIAGNOSIS — G629 Polyneuropathy, unspecified: Secondary | ICD-10-CM | POA: Diagnosis not present

## 2022-11-26 DIAGNOSIS — I209 Angina pectoris, unspecified: Secondary | ICD-10-CM | POA: Diagnosis not present

## 2022-11-26 DIAGNOSIS — E782 Mixed hyperlipidemia: Secondary | ICD-10-CM | POA: Diagnosis not present

## 2022-11-26 DIAGNOSIS — E1169 Type 2 diabetes mellitus with other specified complication: Secondary | ICD-10-CM | POA: Diagnosis not present

## 2022-11-26 DIAGNOSIS — R079 Chest pain, unspecified: Secondary | ICD-10-CM

## 2022-11-26 DIAGNOSIS — K219 Gastro-esophageal reflux disease without esophagitis: Secondary | ICD-10-CM | POA: Diagnosis not present

## 2022-11-26 MED ORDER — TESTOSTERONE 20.25 MG/1.25 GRAM PER PUMP ACT.(1.62 %) TRANSDERMAL GEL
Freq: Every day | TRANSDERMAL | 3 refills | 90 days | Status: CP
Start: 2022-11-26 — End: 2023-11-26

## 2022-11-27 DIAGNOSIS — E1169 Type 2 diabetes mellitus with other specified complication: Secondary | ICD-10-CM | POA: Diagnosis not present

## 2022-11-27 DIAGNOSIS — E785 Hyperlipidemia, unspecified: Secondary | ICD-10-CM | POA: Diagnosis not present

## 2022-11-27 DIAGNOSIS — Z794 Long term (current) use of insulin: Secondary | ICD-10-CM | POA: Diagnosis not present

## 2022-11-27 DIAGNOSIS — E1159 Type 2 diabetes mellitus with other circulatory complications: Secondary | ICD-10-CM | POA: Diagnosis not present

## 2022-11-27 DIAGNOSIS — E1140 Type 2 diabetes mellitus with diabetic neuropathy, unspecified: Secondary | ICD-10-CM | POA: Diagnosis not present

## 2022-11-27 DIAGNOSIS — E1165 Type 2 diabetes mellitus with hyperglycemia: Secondary | ICD-10-CM | POA: Diagnosis not present

## 2022-11-27 DIAGNOSIS — I152 Hypertension secondary to endocrine disorders: Secondary | ICD-10-CM | POA: Diagnosis not present

## 2022-11-28 ENCOUNTER — Ambulatory Visit
Admission: RE | Admit: 2022-11-28 | Discharge: 2022-11-28 | Disposition: A | Discharge status: Home / Self Care | Payer: 59 | Source: Ambulatory Visit | Attending: Physician Assistant | Admitting: Physician Assistant

## 2022-11-28 DIAGNOSIS — R55 Syncope and collapse: Secondary | ICD-10-CM | POA: Diagnosis not present

## 2022-11-29 ENCOUNTER — Ambulatory Visit: Admit: 2022-11-29 | Discharge: 2022-11-30 | Payer: MEDICARE

## 2022-11-29 DIAGNOSIS — E1169 Type 2 diabetes mellitus with other specified complication: Principal | ICD-10-CM

## 2022-11-29 DIAGNOSIS — R7989 Other specified abnormal findings of blood chemistry: Principal | ICD-10-CM

## 2022-11-29 DIAGNOSIS — E785 Hyperlipidemia, unspecified: Principal | ICD-10-CM

## 2022-11-29 DIAGNOSIS — Z1329 Encounter for screening for other suspected endocrine disorder: Principal | ICD-10-CM

## 2022-11-29 DIAGNOSIS — Z23 Encounter for immunization: Secondary | ICD-10-CM | POA: Diagnosis not present

## 2022-11-29 DIAGNOSIS — K219 Gastro-esophageal reflux disease without esophagitis: Secondary | ICD-10-CM | POA: Diagnosis not present

## 2022-11-29 DIAGNOSIS — Z794 Long term (current) use of insulin: Secondary | ICD-10-CM | POA: Diagnosis not present

## 2022-11-29 DIAGNOSIS — E11628 Type 2 diabetes mellitus with other skin complications: Secondary | ICD-10-CM | POA: Diagnosis not present

## 2022-11-29 DIAGNOSIS — I10 Essential (primary) hypertension: Secondary | ICD-10-CM | POA: Diagnosis not present

## 2022-11-29 DIAGNOSIS — Z0000 Encounter for general adult medical examination without abnormal findings: Secondary | ICD-10-CM | POA: Diagnosis not present

## 2022-11-29 DIAGNOSIS — E1142 Type 2 diabetes mellitus with diabetic polyneuropathy: Secondary | ICD-10-CM | POA: Diagnosis not present

## 2022-11-29 DIAGNOSIS — D509 Iron deficiency anemia, unspecified: Secondary | ICD-10-CM | POA: Diagnosis not present

## 2022-11-29 LAB — CBC
HEMATOCRIT: 33.6 % — ABNORMAL LOW (ref 39.0–48.0)
HEMOGLOBIN: 11 g/dL — ABNORMAL LOW (ref 12.9–16.5)
MEAN CORPUSCULAR HEMOGLOBIN CONC: 32.7 g/dL (ref 32.0–36.0)
MEAN CORPUSCULAR HEMOGLOBIN: 29.8 pg (ref 25.9–32.4)
MEAN CORPUSCULAR VOLUME: 91.2 fL (ref 77.6–95.7)
MEAN PLATELET VOLUME: 12.5 fL — ABNORMAL HIGH (ref 6.8–10.7)
PLATELET COUNT: 183 10*9/L (ref 150–450)
RED BLOOD CELL COUNT: 3.68 10*12/L — ABNORMAL LOW (ref 4.26–5.60)
RED CELL DISTRIBUTION WIDTH: 14.2 % (ref 12.2–15.2)
WBC ADJUSTED: 8.6 10*9/L (ref 3.6–11.2)

## 2022-11-29 LAB — COMPREHENSIVE METABOLIC PANEL
ALBUMIN: 3.6 g/dL (ref 3.4–5.0)
ALKALINE PHOSPHATASE: 81 U/L (ref 46–116)
ALT (SGPT): 17 U/L (ref 10–49)
ANION GAP: 6 mmol/L (ref 5–14)
AST (SGOT): 23 U/L (ref <=34)
BILIRUBIN TOTAL: 0.2 mg/dL — ABNORMAL LOW (ref 0.3–1.2)
BLOOD UREA NITROGEN: 43 mg/dL — ABNORMAL HIGH (ref 9–23)
BUN / CREAT RATIO: 35
CALCIUM: 9.6 mg/dL (ref 8.7–10.4)
CHLORIDE: 110 mmol/L — ABNORMAL HIGH (ref 98–107)
CO2: 25 mmol/L (ref 20.0–31.0)
CREATININE: 1.24 mg/dL — ABNORMAL HIGH
EGFR CKD-EPI (2021) MALE: 67 mL/min/{1.73_m2} (ref >=60)
GLUCOSE RANDOM: 105 mg/dL — ABNORMAL HIGH (ref 70–99)
POTASSIUM: 5.2 mmol/L — ABNORMAL HIGH (ref 3.4–4.8)
PROTEIN TOTAL: 7.7 g/dL (ref 5.7–8.2)
SODIUM: 141 mmol/L (ref 135–145)

## 2022-11-29 LAB — LIPID PANEL
CHOLESTEROL/HDL RATIO SCREEN: 3.2 (ref 1.0–4.5)
CHOLESTEROL: 116 mg/dL (ref <=200)
HDL CHOLESTEROL: 36 mg/dL — ABNORMAL LOW (ref 40–60)
LDL CHOLESTEROL CALCULATED: 63 mg/dL (ref 40–99)
NON-HDL CHOLESTEROL: 80 mg/dL (ref 70–130)
TRIGLYCERIDES: 87 mg/dL (ref 0–150)
VLDL CHOLESTEROL CAL: 17.4 mg/dL (ref 12–47)

## 2022-11-29 LAB — ALBUMIN / CREATININE URINE RATIO
ALBUMIN QUANT URINE: 10.3 mg/dL
ALBUMIN/CREATININE RATIO: 139.8 ug/mg — ABNORMAL HIGH (ref 0.0–30.0)
CREATININE, URINE: 73.7 mg/dL

## 2022-11-29 LAB — TSH: THYROID STIMULATING HORMONE: 0.958 u[IU]/mL (ref 0.550–4.780)

## 2022-11-29 MED ORDER — ATORVASTATIN 40 MG TABLET
ORAL | 2 refills | 90 days | Status: CP
Start: 2022-11-29 — End: 2023-11-28

## 2022-11-29 MED ORDER — EMPAGLIFLOZIN 10 MG TABLET
ORAL_TABLET | Freq: Every day | ORAL | 2 refills | 45 days | Status: CP
Start: 2022-11-29 — End: ?

## 2022-12-02 NOTE — Unmapped (Signed)
Assessment and Plan:     Cory Bentley was seen today for annual exam.    Diagnoses and all orders for this visit:    Encounter for annual physical exam  -     CBC  -     Comprehensive Metabolic Panel  -     Lipid Panel  -     TSH    Type 2 diabetes mellitus with other skin complication, with long-term current use of insulin (CMS-HCC)    Pt is followed by Endo with last appt 11/27/2022  Pt range over 14 days was 134 with no hypoglycemic events   He is currently on Jardiance 10mg  and Trulicity 1.5mg  weekly   He is no longer using insulin    -     empagliflozin (JARDIANCE) 10 mg tablet; Take 2 tablets (20 mg total) by mouth daily.  -     Albumin/creatinine urine ratio    Diabetic polyneuropathy associated with type 2 diabetes mellitus (CMS-HCC)    DM foot exam done today and pt felt 4/8 on bilat feet   He is on gabapentin     Primary hypertension    BP today os 106/58  This is at goal   Pt denies CP, SHOB, DOE, palpitations, dizziness, HA's, vision changes   He is on hydrochlorothiazide 125mg  and lisinopril 40mg     -     Comprehensive Metabolic Panel    Hyperlipidemia associated with type 2 diabetes mellitus (CMS-HCC)    Denies SE of statin therapy   He is trying to lead an active lifestyle within his limitations   He tries to follow a health diet low in saturated fats   He has lost 5lbs     -     Lipid Panel  -     atorvastatin (LIPITOR) 40 MG tablet; Take 1 tablet (40 mg total) by mouth daily.    Gastroesophageal reflux disease, unspecified whether esophagitis present    Iron deficiency anemia, unspecified iron deficiency anemia type  -     CBC    Low testosterone in male    Screening for thyroid disorder  -     TSH    Other orders  -     PNEUMOCOCCAL CONJUGATE VACCINE 20-VALENT        62 y.o. male here for routine PE, doing well  Discussed importance of preventative exams including:  - monthly self testicular exams  - bi annual dental exams  - annual eye exams  - annual dermatology visit for skin check & sunscreen protection        Return in about 3 months (around 03/01/2023) for Recheck, Diabetes.or sooner as needed     Health care  maintenance:  Colonoscopy - GI referral placed for colonoscopy todaysooner if symptomatic   Screening Low Dose CT (age 31-80 yearly if 30pk year history and those who quit in past 15 years as per USPSTF)- n/a never smoker   Hep C screening (age 4-7 year old as per USPSTF guidelines)-   Lab Results   Component Value Date    HEPCAB Nonreactive 09/26/2020    UTD, results reviewed  HIV screening(as per USPSTF age 9-65)-    Lab Results   Component Value Date    HIV Nonreactive 05/08/2018    Due, pt declined at this time, pt counseled on importance of routine screening  PSA- No results found for: PSASCRN, PSADIAG Due, ordered today  Pneumovax/Prevnar- Due, ordered today ,    Tdap-UTDTdaP 09/28/2020  COVID vaccine-UTD COVID-19 VACC,MRNA,(PFIZER)(PF) 09/03/2019 09/24/2019   Flu Vaccine- UTDInfluenza Vaccine Quad(IM)6 MO-Adult(PF) 03/13/2022     Prior Labs:  Lab Results   Component Value Date    A1C 7.1 (H) 03/13/2022      No results found for: LIPID  Lab Results   Component Value Date    NA 141 11/29/2022    K 5.2 (H) 11/29/2022    CL 110 (H) 11/29/2022    ANIONGAP 6 11/29/2022    CO2 25.0 11/29/2022    BUN 43 (H) 11/29/2022    CREATININE 1.24 (H) 11/29/2022    BCR 35 11/29/2022    GLU 105 (H) 11/29/2022    CALCIUM 9.6 11/29/2022    ALBUMIN 3.6 11/29/2022    PROT 7.7 11/29/2022    BILITOT 0.2 (L) 11/29/2022    AST 23 11/29/2022    ALT 17 11/29/2022    ALKPHOS 81 11/29/2022      Lab Results   Component Value Date    WBC 8.6 11/29/2022    HGB 11.0 (L) 11/29/2022    HCT 33.6 (L) 11/29/2022    MCV 91.2 11/29/2022    RDW 14.2 11/29/2022    PLT 183 11/29/2022    NEUTROPCT 51.6 07/25/2021    LYMPHOPCT 27.0 07/25/2021    MONOPCT 11.6 07/25/2021    EOSPCT 8.9 07/25/2021    BASOPCT 0.7 07/25/2021        Subjective:     Chief Complaint   Patient presents with    Annual Exam     Has started having sciatic pain. Sharp pain on the back  lower left side.       Cory Bentley 62 y.o.male  is being seen for a complete physical exam.    ROS:   Review of Systems   Constitutional:  Negative for chills, fever, malaise/fatigue and weight loss.   HENT:  Negative for congestion and hearing loss.    Eyes:  Negative for pain, discharge and redness.   Respiratory:  Negative for cough and shortness of breath.    Cardiovascular:  Negative for chest pain, palpitations and leg swelling.   Gastrointestinal:  Negative for abdominal pain, constipation, diarrhea, heartburn, nausea and vomiting.   Genitourinary:  Negative for dysuria.   Musculoskeletal:  Negative for back pain, myalgias and neck pain.   Skin:  Negative for rash.   Neurological:  Negative for dizziness, weakness and headaches.   Endo/Heme/Allergies:  Negative for polydipsia. Does not bruise/bleed easily.   Psychiatric/Behavioral:  Negative for depression. The patient is not nervous/anxious and does not have insomnia.        Vital Signs:      Visit Vitals  BP 106/58   Pulse 73   Temp 36.7 ??C (98.1 ??F) (Oral)   Ht 171.2 cm (5' 7.4)   Wt 86.2 kg (190 lb 1.6 oz)   SpO2 98%   BMI 29.42 kg/m??      There were no vitals filed for this visit.     Objective:     Physical Exam:  Physical Exam  Vitals reviewed.   Constitutional:       General: He is not in acute distress.     Appearance: Normal appearance. He is well-developed and well-groomed. He is not ill-appearing.   HENT:      Head: Normocephalic and atraumatic.      Right Ear: Hearing, tympanic membrane, ear canal and external ear normal.      Left Ear: Hearing, tympanic membrane, ear canal and  external ear normal.      Nose: Nose normal.      Mouth/Throat:      Lips: Pink.      Mouth: Mucous membranes are moist.      Pharynx: Oropharynx is clear. Uvula midline. No pharyngeal swelling, oropharyngeal exudate, posterior oropharyngeal erythema or uvula swelling.      Tonsils: No tonsillar exudate. 0 on the right. 0 on the left. Eyes:      General: Lids are normal. Vision grossly intact. Gaze aligned appropriately.      Extraocular Movements: Extraocular movements intact.      Conjunctiva/sclera: Conjunctivae normal.      Pupils: Pupils are equal, round, and reactive to light.   Neck:      Thyroid: No thyroid mass, thyromegaly or thyroid tenderness.      Vascular: No carotid bruit or JVD.      Trachea: Trachea normal.   Cardiovascular:      Rate and Rhythm: Normal rate and regular rhythm.      Pulses:           Carotid pulses are 2+ on the right side and 2+ on the left side.       Radial pulses are 2+ on the right side and 2+ on the left side.      Heart sounds: Normal heart sounds, S1 normal and S2 normal.   Pulmonary:      Effort: Pulmonary effort is normal.      Breath sounds: Normal breath sounds and air entry.   Abdominal:      General: Bowel sounds are normal.      Palpations: Abdomen is soft. There is no mass.      Tenderness: There is no abdominal tenderness.      Hernia: No hernia is present.   Genitourinary:     Comments: Declined testicular exam   Musculoskeletal:         General: No tenderness. Normal range of motion.      Cervical back: Normal range of motion and neck supple.      Right lower leg: No edema.      Left lower leg: No edema.   Feet:      Right foot:      Protective Sensation: 4 sites tested.  8 sites sensed.      Left foot:      Protective Sensation: 4 sites tested.  8 sites sensed.   Lymphadenopathy:      Head:      Right side of head: No submandibular or tonsillar adenopathy.      Left side of head: No submandibular or tonsillar adenopathy.      Cervical: No cervical adenopathy.   Skin:     General: Skin is warm and dry.      Findings: No rash.   Neurological:      Mental Status: He is alert and oriented to person, place, and time.      Cranial Nerves: No cranial nerve deficit (CN II-XII grossly intact).      Gait: Gait normal.      Deep Tendon Reflexes:      Reflex Scores:       Bicep reflexes are 2+ on the right side and 2+ on the left side.       Patellar reflexes are 2+ on the right side and 2+ on the left side.  Psychiatric:         Attention and Perception: Attention normal.  Mood and Affect: Mood and affect normal.         Speech: Speech normal.         Behavior: Behavior normal. Behavior is cooperative.         Thought Content: Thought content normal.              Medication adherence and barriers to the treatment plan have been addressed. Opportunities to optimize healthy behaviors have been discussed. Patient / caregiver voiced understanding.

## 2022-12-04 ENCOUNTER — Telehealth (HOSPITAL_COMMUNITY): Payer: Self-pay | Admitting: *Deleted

## 2022-12-04 MED ORDER — METOPROLOL TARTRATE 100 MG PO TABS: ORAL_TABLET | ORAL | 0 refills | Status: DC

## 2022-12-04 NOTE — Unmapped (Signed)
PA JARDIANCE initiated electronically via Cover My Meds

## 2022-12-04 NOTE — Telephone Encounter (Signed)
Reaching out to patient to offer assistance regarding upcoming cardiac imaging study; pt verbalizes understanding of appt date/time, parking situation and where to check in, pre-test NPO status and medications ordered, and verified current allergies; name and call back number provided for further questions should they arise  Larey Brick RN Navigator Cardiac Imaging [facility] [phone] office [phone] cell  Patient to take 100mg  metoprolol tartrate two hours prior to his cardiac CT scan. He will hold his lisinopril.

## 2022-12-05 ENCOUNTER — Other Ambulatory Visit: Payer: Self-pay

## 2022-12-05 ENCOUNTER — Other Ambulatory Visit: Payer: Self-pay | Admitting: Cardiology

## 2022-12-05 ENCOUNTER — Other Ambulatory Visit (HOSPITAL_COMMUNITY): Payer: Self-pay | Admitting: *Deleted

## 2022-12-05 ENCOUNTER — Ambulatory Visit
Admission: RE | Admit: 2022-12-05 | Discharge: 2022-12-05 | Disposition: A | Discharge status: Home / Self Care | Payer: 59 | Source: Ambulatory Visit | Attending: Internal Medicine | Admitting: Internal Medicine

## 2022-12-05 DIAGNOSIS — R079 Chest pain, unspecified: Secondary | ICD-10-CM

## 2022-12-05 DIAGNOSIS — I209 Angina pectoris, unspecified: Secondary | ICD-10-CM

## 2022-12-05 DIAGNOSIS — R55 Syncope and collapse: Secondary | ICD-10-CM

## 2022-12-05 DIAGNOSIS — R072 Precordial pain: Secondary | ICD-10-CM

## 2022-12-05 MED ORDER — METOPROLOL TARTRATE 100 MG PO TABS: ORAL_TABLET | ORAL | 0 refills | Status: DC

## 2022-12-05 MED ORDER — IVABRADINE HCL 5 MG PO TABS
15.0000 mg | ORAL_TABLET | Freq: Once | ORAL | Status: AC
Start: 2022-12-05 — End: ?

## 2022-12-05 MED ORDER — IVABRADINE HCL 5 MG PO TABS
15.0000 mg | ORAL_TABLET | Freq: Every day | ORAL | 0 refills | Status: DC
  Filled 2022-12-05: qty 1, 1d supply, fill #0

## 2022-12-05 NOTE — Unmapped (Signed)
PA JARDIANCE APPROVED    Decision Notes: JARDIANCE TAB 10MG , use as directed (2 tablets per day), is approved through 04/15/2023 under your Medicare Part D benefit. Reviewed by: Diego Cory.Ph.

## 2022-12-05 NOTE — Progress Notes (Signed)
Patient with high HR and low BP. Unable to complete scan per Dr. Myriam Forehand - reschedule for the hospital.  Nurse navigator to send in RX to [facility] outpatient pharmacy for ivabradine 15mg  and Metoprolol 100mg  per Dr. Myriam Forehand..Patient has difficulaty obtaining medications due to his budget.

## 2022-12-06 ENCOUNTER — Other Ambulatory Visit (HOSPITAL_COMMUNITY): Payer: Self-pay | Admitting: *Deleted

## 2022-12-06 ENCOUNTER — Telehealth (HOSPITAL_COMMUNITY): Payer: Self-pay | Admitting: *Deleted

## 2022-12-06 MED ORDER — IVABRADINE HCL 7.5 MG PO TABS: ORAL_TABLET | ORAL | 0 refills | Status: DC

## 2022-12-06 NOTE — Telephone Encounter (Signed)
Reaching out to patient to offer assistance regarding upcoming cardiac imaging study; pt verbalizes understanding of appt date/time, parking situation and where to check in, pre-test NPO status and medications ordered, and verified current allergies; name and call back number provided for further questions should they arise  Larey Brick RN Navigator Cardiac Imaging [facility] [phone] office [phone] cell  Patient to take 100mg  metoprolol tartrate two hours prior to his cardiac CT scan. He may take the corlanor. He was asked to hold his lisinopril x 2 days.

## 2022-12-09 ENCOUNTER — Ambulatory Visit
Admission: RE | Admit: 2022-12-09 | Discharge: 2022-12-09 | Disposition: A | Discharge status: Home / Self Care | Payer: 59 | Source: Ambulatory Visit | Attending: Internal Medicine | Admitting: Internal Medicine

## 2022-12-09 DIAGNOSIS — I209 Angina pectoris, unspecified: Secondary | ICD-10-CM | POA: Insufficient documentation

## 2022-12-09 DIAGNOSIS — R079 Chest pain, unspecified: Secondary | ICD-10-CM | POA: Insufficient documentation

## 2022-12-09 DIAGNOSIS — R55 Syncope and collapse: Secondary | ICD-10-CM | POA: Insufficient documentation

## 2022-12-09 MED ORDER — DILTIAZEM HCL 25 MG/5ML IV SOLN
10.0000 mg | INTRAVENOUS | Status: DC | PRN
  Administered 2022-12-09: 2.5 mg via INTRAVENOUS
  Filled 2022-12-09: qty 5

## 2022-12-09 MED ORDER — METOPROLOL TARTRATE 5 MG/5ML IV SOLN: 10.0000 mg | Freq: Once | INTRAVENOUS | Status: DC | PRN

## 2022-12-09 MED ORDER — IOHEXOL 350 MG/ML SOLN
80.0000 mL | Freq: Once | INTRAVENOUS | Status: AC | PRN
  Administered 2022-12-09: 80 mL via INTRAVENOUS

## 2022-12-09 MED ORDER — NITROGLYCERIN 0.4 MG SL SUBL
0.8000 mg | SUBLINGUAL_TABLET | Freq: Once | SUBLINGUAL | Status: AC
  Administered 2022-12-09: 0.8 mg via SUBLINGUAL
  Filled 2022-12-09: qty 25

## 2022-12-09 MED ORDER — METOPROLOL TARTRATE 5 MG/5ML IV SOLN
10.0000 mg | Freq: Once | INTRAVENOUS | Status: AC
  Administered 2022-12-09: 10 mg via INTRAVENOUS
  Filled 2022-12-09: qty 10

## 2022-12-09 NOTE — Progress Notes (Signed)
Patient tolerated procedure well. Ambulate w/o difficulty. Denies any lightheadedness or being dizzy. Pt denies any pain at this time. Sitting in chair, drinking water provided. P is encouraged to drink additional water throughout the day and reason explained to patient. Patient verbalized understanding and all questions answered. ABC intact. No further needs at this time. Discharge from procedure area w/o issues.  °

## 2022-12-11 DIAGNOSIS — E860 Dehydration: Principal | ICD-10-CM

## 2022-12-11 DIAGNOSIS — E875 Hyperkalemia: Principal | ICD-10-CM

## 2022-12-11 DIAGNOSIS — D649 Anemia, unspecified: Principal | ICD-10-CM

## 2022-12-11 NOTE — Unmapped (Signed)
Good morning Cory Bentley     I have reviewed your labs     Your potassium is elevated - I recommend that we double up on your hydrochlorothiazide for a week and then recheck your labs - So for the next week, take 2 of the hydrochlorothiazide 12.5mg  tabs   I will have the front desk to call and schedule you a lab only appt for 7-10 days from now     Your chloride and creatinine are elevated - This is most likely from poor hydration status - I recommend that you drink at least 64+oz of water a day - We will recheck you labs in 7-10 days     Your HDL (good cholesterol) is low   Some foods to try and improve the HDL level (good cholesterol) are oatmeal, cabbage, eggplant, avocado, garlic, nuts, legumes, salmon (fish in general), black tea, citrus fruits    You appear to have a slight anemia -   I do think that you could benefit from a daily iron supp (over the counter) - Take one daily - Be sure to take a stool softener (such as Colace) and drink plenty of water as iron can be constipating   Good sources of iron include:  Apricots  Beef  Chicken, [place], fish, and other meats  Dried beans, lentils, and soybeans  Peas  Eggs  Liver  Molasses  Oatmeal  Peanut butter  Prune juice  Raisins and prunes  Spinach, kale and other greens  Also, try to eat fruits which are high in vitamin C to aid in iron absorption - Vitamin B12 also aids in??iron??absorption, liver is a rich source of vitamin B12  Try to consume less foods/drinks that stop you absorbing iron, like calcium, tea, coffee and wine  Are you having in dark stools or blood in your stools?  You need to have a colonoscopy - I have the order placed - I recommend that you call and schedule this - The number is [phone]     [name]

## 2022-12-13 DIAGNOSIS — R55 Syncope and collapse: Secondary | ICD-10-CM | POA: Diagnosis not present

## 2022-12-13 DIAGNOSIS — Z794 Long term (current) use of insulin: Secondary | ICD-10-CM | POA: Diagnosis not present

## 2022-12-13 DIAGNOSIS — E11628 Type 2 diabetes mellitus with other skin complications: Secondary | ICD-10-CM | POA: Diagnosis not present

## 2022-12-19 ENCOUNTER — Ambulatory Visit: Admit: 2022-12-19 | Payer: MEDICARE

## 2022-12-20 ENCOUNTER — Other Ambulatory Visit: Admit: 2022-12-20 | Discharge: 2022-12-21 | Payer: MEDICARE

## 2022-12-20 DIAGNOSIS — E875 Hyperkalemia: Principal | ICD-10-CM

## 2022-12-20 DIAGNOSIS — D649 Anemia, unspecified: Secondary | ICD-10-CM | POA: Diagnosis not present

## 2022-12-20 DIAGNOSIS — E860 Dehydration: Secondary | ICD-10-CM | POA: Diagnosis not present

## 2022-12-20 LAB — COMPREHENSIVE METABOLIC PANEL
ALBUMIN: 3.6 g/dL (ref 3.4–5.0)
ALKALINE PHOSPHATASE: 85 U/L (ref 46–116)
ALT (SGPT): 30 U/L (ref 10–49)
ANION GAP: 6 mmol/L (ref 5–14)
AST (SGOT): 30 U/L (ref <=34)
BILIRUBIN TOTAL: 0.3 mg/dL (ref 0.3–1.2)
BLOOD UREA NITROGEN: 40 mg/dL — ABNORMAL HIGH (ref 9–23)
BUN / CREAT RATIO: 36
CALCIUM: 9.6 mg/dL (ref 8.7–10.4)
CHLORIDE: 107 mmol/L (ref 98–107)
CO2: 27 mmol/L (ref 20.0–31.0)
CREATININE: 1.11 mg/dL
EGFR CKD-EPI (2021) MALE: 76 mL/min/{1.73_m2} (ref >=60)
GLUCOSE RANDOM: 132 mg/dL (ref 70–179)
POTASSIUM: 5.5 mmol/L — ABNORMAL HIGH (ref 3.5–5.1)
PROTEIN TOTAL: 6.9 g/dL (ref 5.7–8.2)
SODIUM: 140 mmol/L (ref 135–145)

## 2022-12-20 LAB — IRON & TIBC
IRON SATURATION: 26 % (ref 20–55)
IRON: 72 ug/dL
TOTAL IRON BINDING CAPACITY: 274 ug/dL (ref 250–425)

## 2022-12-20 LAB — FERRITIN: FERRITIN: 52.1 ng/mL

## 2022-12-24 NOTE — Unmapped (Signed)
Please notify pt of lab result note   Thanks  [name]

## 2022-12-24 NOTE — Unmapped (Signed)
PCP:  Amie Portland, FNP    12/24/2022  [id number]  [facility] [place]  [address]  [name]  [place] [address]  Dept: [phone]  Loc: [phone]    Chief Complaint: Followup of proteinuria    HPI:  [name] is a 62 y.o.  male who presents for followup of proteinuria. 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 His diabetes was poorly controlled for many years, with a documented a1c on his chart of 14%. He notes that this was fairly typical for him during that time period and many times he would max out his glucometer. His glucose is much better controlled now.    FH is positive for a cousin who required kidney transplant x2. The cause of renal disease is unknown. FH is also positive for DM2.     Was in ED 07/25/2021 for cellulitis to his right great toe. His SBP was noted to be 200 and his lisinopril was increased to 40mg  and he was started on nifedipine 30mg . He is not taking the nifedipine at this time as it made him feel strange with some parathesias to his neck. He was also placed on hydrochlorothiazide 12.5mg .     Had a Holter monitor placed 11/2022 for syncope.     At his LOV, creatinine was a bit more elevated than before.     He has been started on Trulicity. Has helped him lose some weight.     No hospitalizations, infections, Abx, or other changes.     ROS:   CONSTITUTIONAL: denies fevers or chills, denies unintentional weight loss   CARDIOVASCULAR: denies chest pain, denies dyspnea on exertion, denies leg edema  GASTROINTESTINAL: denies nausea, denies vomiting, denies anorexia  GENITOURINARY: denies dysuria, denies hematuria, denies decreased urinary stream  All 10 systems reviewed and are negative except as listed above.    PAST MEDICAL HISTORY:  Past Medical History:   Diagnosis Date    Cellulitis of left foot 07/24/2021    Diabetes mellitus (CMS-HCC)     Gastroparesis due to DM (CMS-HCC) 09/09/2019    Hypertension     Psoriasis     Substance abuse (CMS-HCC)        ALLERGIES  Patient has no known allergies.    FAMILY HISTORY  Family History   Problem Relation Age of Onset    Diabetes Mother     Diabetes type II Mother     Ovarian cancer Mother     Diabetes Father     Diabetes type II Father     Diabetes type II Sister     Diabetes type II Brother     Diabetes type II Maternal Grandmother     Diabetes type II Maternal Grandfather     Diabetes type II Paternal Grandmother     Diabetes type II Paternal Grandfather     Melanoma Neg Hx     Basal cell carcinoma Neg Hx     Squamous cell carcinoma Neg Hx        SOCIAL HISTORY   reports that he has never smoked. He has never been exposed to tobacco smoke. He has never used smokeless tobacco. He reports current alcohol use. He reports current drug use. Frequency: 7.00 times per week. Drug: Marijuana.    MEDICATIONS:  Current Outpatient Medications  Medication Sig Dispense Refill    alcohol swabs (ALCOHOL PADS) PadM Apply 1 swab topically four (4) times a day. 400 each 1    atorvastatin (LIPITOR) 40 MG tablet Take 1 tablet (40 mg total) by mouth daily. 90 tablet 2    BD ULTRA-FINE MINI PEN NEEDLE 31 gauge x 3/16 (5 mm) Ndle INJECT FOUR TIMES DAILY 400 each 1    betamethasone valerate (VALISONE) 0.1 % ointment APPLY TO THICK AREAS OF PSORIASIS TWICE DAILY AS NEEDED UNTIL SMOOTH AVOID FACE AND SKIN FOLDS 45 g 8    blood sugar diagnostic Strp Accu Check Aviva Strip, use 4 times per day for monitoring insulin dependant uncontrolled diabetes      blood-glucose meter kit Use as instructed 1 each 0    diaper,brief,adult,disposable Misc 1 each by Miscellaneous route two (2) times a day. 20 each 2    dulaglutide (TRULICITY) 1.5 mg/0.5 mL PnIj Inject 0.5 mL (1.5 mg total) under the skin every seven (7) days.      DULoxetine (CYMBALTA) 20 MG capsule empagliflozin (JARDIANCE) 10 mg tablet Take 2 tablets (20 mg total) by mouth daily. 90 tablet 2    empty container Misc Use as directed to dispose of Stelara syringes. 1 each 2    flash glucose sensor (FREESTYLE LIBRE 14 DAY SENSOR) kit Apply new sensor every 14 days and use as directed to monitor and check blood sugars 2 each 12    gabapentin (NEURONTIN) 800 MG tablet Take 1 tablet (800 mg total) by mouth four (4) times a day. 360 tablet 1    hydroCHLOROthiazide (MICROZIDE) 12.5 mg capsule Take 1 capsule (12.5 mg total) by mouth daily. 90 capsule 3    hydrocortisone 2.5 % ointment APPLY TO PSORIASIS IN EARS UP TO TWICE DAILY AS NEEDED UNTIL SMOOTH 28.35 g 0    insulin aspart (NOVOLOG FLEXPEN U-100 INSULIN) 100 unit/mL (3 mL) injection pen Inject 0.1 mL (10 Units total) under the skin Three (3) times a day before meals. 27 mL 3    insulin detemir U-100 (LEVEMIR FLEXPEN) 100 unit/mL (3 mL) injection pen Inject 0.52 mL (52 Units total) under the skin daily. 45 mL 0    lisinopril (PRINIVIL,ZESTRIL) 40 MG tablet Take 1 tablet (40 mg total) by mouth daily. 90 tablet 1    omeprazole (PRILOSEC) 20 MG capsule TAKE 1 CAPSULE(20 MG) BY MOUTH DAILY 90 capsule 1    testosterone 20.25 mg/1.25 gram (1.62 %) gel pump PLACE 2 SPRAYS ON THE SKIN DAILY 3.65 g 3    ustekinumab (STELARA) 45 mg/0.5 mL Syrg syringe Inject the contents of 1 syringe (45 mg total) under the skin every 12 weeks. 2 mL 3     No current facility-administered medications for this visit.       PHYSICAL EXAM:  Wt Readings from Last 3 Encounters:   11/29/22 86.2 kg (190 lb 1.6 oz)   08/08/22 88.8 kg (195 lb 12.8 oz)   07/24/22 92.1 kg (203 lb)     Temp Readings from Last 3 Encounters:   11/29/22 36.7 ??C (98.1 ??F) (Oral)   08/08/22 35.9 ??C (96.6 ??F) (Temporal)   07/24/22 36.4 ??C (97.5 ??F) (Temporal)     BP Readings from Last 5 Encounters:   11/29/22 106/58   08/08/22 101/74   07/24/22 128/66   06/12/22 163/84   04/24/22 128/78     Pulse Readings from Last 3 Encounters:   11/29/22 73   08/08/22 91   07/24/22 82  CONSTITUTIONAL: Alert,well appearing, no distress. Ambulates with a cane  HEENT: Moist mucous membranes, oropharynx clear without erythema or exudate  EYES: Extra ocular movements intact. Pupils reactive, sclerae anicteric.  NECK: Supple, no lymphadenopathy  CARDIOVASCULAR: Regular, normal S1/S2 heart sounds, no murmurs, no rubs.   PULM: Clear to auscultation bilaterally  GASTROINTESTINAL: Soft, active bowel sounds, nontender  EXTREMITIES: No lower extremity edema bilaterally.   SKIN: No rashes or lesions  NEUROLOGIC: No focal motor or sensory deficits    MEDICAL DECISION MAKING  Urinalysis performed in office revealed specific gravity 1.015 with pH of 6 and was negative for leukocyte esterase, nitrite, urobilinogen, protein, blood, ketone, bilirubin or glucose.     Creatinine   Date Value Ref Range Status   12/20/2022 1.11 0.73 - 1.18 mg/dL Final   29/56/2130 8.65 (H) 0.73 - 1.18 mg/dL Final   78/46/9629 5.28 0.73 - 1.18 mg/dL Final   41/32/4401 0.27 0.50 - 1.40 mg/dL Final   25/36/6440 3.47 0.50 - 1.40 mg/dL Final   42/59/5638 7.56 0.50 - 1.40 mg/dL Final   43/32/9518 8.41 0.60 - 1.10 mg/dL Final   66/09/3014 0.10 0.60 - 1.10 mg/dL Final   93/23/5573 2.20 0.60 - 1.10 mg/dL Final   25/42/7062 1.0 0.5 - 1.4 mg/dL Final   37/62/8315 0.9 0.5 - 1.4 mg/dL Final   17/61/6073 0.8 0.5 - 1.4 mg/dL Final   71/09/2692 1.1 0.5 - 1.4 mg/dL Final   85/46/2703 1.0 0.5 - 1.4 mg/dL Final   50/12/3816 1.2 0.5 - 1.4 mg/dL Final   29/93/7169 1.1 0.5 - 1.4 mg/dL Final   67/89/3810 1.0 0.5 - 1.4 mg/dL Final   17/51/0258 0.8 0.5 - 1.4 mg/dL Final   52/77/8242 0.9 0.5 - 1.4 mg/dL Final        Lab Results   Component Value Date    NA 140 12/20/2022    K 5.5 (H) 12/20/2022    CL 107 12/20/2022    CO2 27.0 12/20/2022    BUN 40 (H) 12/20/2022    CREATININE 1.11 12/20/2022    GLU 132 12/20/2022    CALCIUM 9.6 12/20/2022    ALBUMIN 3.6 12/20/2022    PHOS 3.0 07/20/2021       Lab Results   Component Value Date    CALCIUM 9.6 12/20/2022       Lab Results   Component Value Date    BILITOT 0.3 12/20/2022    BILIDIR 0.40 06/01/2018    PROT 6.9 12/20/2022    ALBUMIN 3.6 12/20/2022    ALT 30 12/20/2022    AST 30 12/20/2022    ALKPHOS 85 12/20/2022    GGT 1,007 (H) 05/28/2018     Lab Results   Component Value Date    INR 1.02 07/24/2021    APTT 27.3 07/24/2021       HGB   Date Value Ref Range Status   11/29/2022 11.0 (L) 12.9 - 16.5 g/dL Final   35/36/1443 15.4 (L) 14.0 - 18.0 g/dL Final   00/86/7619 50.9 (L) 14.0 - 18.0 g/dL Final   32/67/1245 80.9 (L) 14.0 - 18.0 g/dL Final   98/33/8250 53.9 12.9 - 16.5 g/dL Final   76/73/4193 79.0 (L) 14.0 - 18.0 g/dL Final   24/12/7351 29.9 (L) 14.0 - 18.0 g/dL Final   24/26/8341 96.2 (L) 14.0 - 18.0 g/dL Final   22/97/9892 11.9 (L) 14.0 - 18.0 g/dL Final   41/74/0814 48.1 (L) 14.0 - 18.0 g/dL Final   85/63/1497 02.6 (L) 14.0 - 18.0  g/dL Final   56/21/3086 57.8 (L) 14.0 - 18.0 g/dL Final   46/96/2952 84.1 (L) 14.0 - 18.0 g/dL Final   32/44/0102 72.5 (L) 14.0 - 18.0 g/dL Final   36/64/4034 74.2 (L) 14.0 - 18.0 g/dL Final        Lab Results   Component Value Date    WBC 8.6 11/29/2022    HGB 11.0 (L) 11/29/2022    HCT 33.6 (L) 11/29/2022    PLT 183 11/29/2022     Lab Results   Component Value Date    IRON 72 12/20/2022    TIBC 274 12/20/2022    FERRITIN 52.1 12/20/2022       Lab Results   Component Value Date    A1C 7.1 (H) 03/13/2022    A1C 7.3 (H) 12/06/2021    A1C 7.5 (H) 07/24/2021       Lab Results   Component Value Date    Color, UA Yellow 03/30/2019    Protein, Ur 55.2 06/12/2022    Glucose, UA Trace (A) 03/30/2019    Ketones, UA Negative 03/30/2019    Blood, UA Trace (A) 03/30/2019    Nitrite, UA Negative 03/30/2019   ]    Lab Results   Component Value Date    PROTEINUR 55.2 06/12/2022    CREATUR 73.7 11/29/2022     Lab Results   Component Value Date    PCRATIOUR 0.562 06/12/2022    PCRATIOUR 1.468 07/20/2021    PCRATIOUR 0.943 01/24/2021       Lab Results   Component Value Date    CHOL 116 11/29/2022    A1C 7.1 (H) 03/13/2022    ANA Negative 05/27/2018    HEPAIGG Nonreactive 04/22/2018    HEPBCAB Nonreactive 09/26/2020    HEPCAB Nonreactive 09/26/2020       ECG 12 Lead  Normal sinus rhythm  Anteroseptal infarct (cited on or before 26-Mar-2019)  Abnormal ECG  Confirmed by Arnette Norris ([id number]) on 07/27/2021 9:15:32 PM       ASSESSMENT/PLAN:    Mr.Cory Bentley is a 62 y.o. patient with a past medical history significant for DM2 w/ DR who is being seen for evaluation of proteinuria.     1. Chronic Kidney Disease Stage 2 - the patient has preserved renal function however he is developing proteinuria which is an indicator of the onset of renal disease. At this time, I urged him to maintain stringent glucose control. He is on Jardiance and lisinopril. HyperK precludes increasing the lisinopril. Continue to check BMP and Urine pro/cr ratio before each visit.     2. HTN - Now hypotensive, hold hydrochlorothiazide. Hold off on nifedipine as it may have caused parathesias     3. DM2 w/ DR - A1c has been as high as 14% in the past, but most recently is 7.2%. Ideally, I would like him to be <7%. Defer to PCP for management. Congratulated him on this improvement.     Mr.Cory Bentley will follow up in 6 month(s) labs not needed.       Disclaimer: Much of the narrative of this dictation was acquired using speech recognition software. It is possible that some dictated speech was not transcribed accurately by this system nor detected in the proofing. Such errors are at times unavoidable.

## 2022-12-25 ENCOUNTER — Ambulatory Visit: Admit: 2022-12-25 | Discharge: 2022-12-26 | Payer: MEDICARE

## 2022-12-25 DIAGNOSIS — R809 Proteinuria, unspecified: Principal | ICD-10-CM

## 2022-12-25 DIAGNOSIS — E11628 Type 2 diabetes mellitus with other skin complications: Principal | ICD-10-CM

## 2022-12-25 DIAGNOSIS — I10 Essential (primary) hypertension: Principal | ICD-10-CM

## 2022-12-25 DIAGNOSIS — Z794 Long term (current) use of insulin: Principal | ICD-10-CM

## 2022-12-25 DIAGNOSIS — N182 Chronic kidney disease, stage 2 (mild): Principal | ICD-10-CM

## 2022-12-25 DIAGNOSIS — E1142 Type 2 diabetes mellitus with diabetic polyneuropathy: Principal | ICD-10-CM

## 2022-12-25 MED ORDER — GABAPENTIN 800 MG TABLET
ORAL_TABLET | Freq: Four times a day (QID) | ORAL | 1 refills | 90 days | Status: CP
Start: 2022-12-25 — End: 2023-12-25

## 2022-12-25 NOTE — Unmapped (Addendum)
Stop the hydrochlorothiazide.

## 2022-12-25 NOTE — Unmapped (Signed)
Patient is requesting the following refill  Requested Prescriptions     Pending Prescriptions Disp Refills    gabapentin (NEURONTIN) 800 MG tablet [Pharmacy Med Name: GABAPENTIN 800MG  TABLETS] 360 tablet 1     Sig: TAKE 1 TABLET(800 MG) BY MOUTH FOUR TIMES DAILY       Recent Visits  Date Type Provider Dept   11/29/22 Office Visit Amie Portland, FNP [facility] [address] [facility]   07/24/22 Office Visit Clapp, Magnus Ivan, FNP [facility] [address] [facility]   04/24/22 Office Visit Clapp, Magnus Ivan, FNP [facility] [address] [facility]   03/13/22 Office Visit Clapp, Magnus Ivan, FNP [facility] [address] [facility]   Showing recent visits within past 365 days and meeting all other requirements  Future Appointments  Date Type Provider Dept   03/03/23 Appointment Coralee North, Magnus Ivan, FNP [facility] [address] [facility]   Showing future appointments within next 365 days and meeting all other requirements       Labs: Not applicable this refill

## 2022-12-26 NOTE — Unmapped (Signed)
[place] [facility] calling in reg ards to getting last visit summary faxed to [phone] in order to get prescription filled.

## 2022-12-27 DIAGNOSIS — E113312 Type 2 diabetes mellitus with moderate nonproliferative diabetic retinopathy with macular edema, left eye: Secondary | ICD-10-CM | POA: Diagnosis not present

## 2022-12-30 DIAGNOSIS — E875 Hyperkalemia: Principal | ICD-10-CM

## 2022-12-30 DIAGNOSIS — R799 Abnormal finding of blood chemistry, unspecified: Principal | ICD-10-CM

## 2022-12-30 MED ORDER — HYDROCHLOROTHIAZIDE 25 MG TABLET
ORAL_TABLET | Freq: Every day | ORAL | 0 refills | 7 days | Status: CP
Start: 2022-12-30 — End: 2023-01-06

## 2022-12-31 NOTE — Unmapped (Signed)
Good afternoon Cory Bentley     I have reviewed your labs     Your potassium level continues to rise - This is not surprising as from my understanding, you did not take the hydrochlorothiazide   You must correct this before it becomes an issue that requires hospitalization  One way is to lower intake of high potassium containing foods   Potassium rich food could include potatoes, sweet potatoes, tomatoes and tomato sauces, avocados, dry beans, leafy green vegetables, OJ, bananas, dried fruits like dates/prunes/figs.   THE OTHER is to take hydrochlorothiazide daily for the next week - I understand tht nephrology took you off of this but you need to take short term to drop the potassium before it becomes and emergent problem - Once you take the hydrochlorothiazide for a week we will recheck labs at 10d mark and the determine what to do      Your BUN is also significantly elevated - This is showing signs of strain on your kidneys - You need to make sure that you are drinking at least 64oz of water a day and we will see if this helps to correct this at follow up lab appt     Your iron studies look good     [name]

## 2023-01-02 NOTE — Unmapped (Signed)
I called the patient and spoke with him in depth about your lab result recommendations. We discussed the foods and making sure he takes the hydrochlorothiazide. He state he picked it up yesterday and started it today. He was concerned that it was 25 mg instead of the lower dose due to his pressure being low. I encouraged him to let us know if he feels it has dropped to low. I made him a lab appointment as well. sct

## 2023-01-02 NOTE — Unmapped (Signed)
-----   Message from [name], FNP sent at 01/01/2023  4:51 PM EDT -----  Please notify pt of most recent lab result note  Thanks   Cory Bentley

## 2023-01-02 NOTE — Unmapped (Signed)
Last visit note printed to fax to the below number/company

## 2023-01-04 DIAGNOSIS — E875 Hyperkalemia: Principal | ICD-10-CM

## 2023-01-04 DIAGNOSIS — E785 Hyperlipidemia, unspecified: Principal | ICD-10-CM

## 2023-01-04 DIAGNOSIS — E1169 Type 2 diabetes mellitus with other specified complication: Principal | ICD-10-CM

## 2023-01-04 MED ORDER — ATORVASTATIN 40 MG TABLET
ORAL_TABLET | Freq: Every day | ORAL | 2 refills | 0 days
Start: 2023-01-04 — End: ?

## 2023-01-04 MED ORDER — HYDROCHLOROTHIAZIDE 25 MG TABLET
ORAL_TABLET | 0 refills | 0 days
Start: 2023-01-04 — End: ?

## 2023-01-06 MED ORDER — ATORVASTATIN 40 MG TABLET
ORAL_TABLET | Freq: Every day | ORAL | 2 refills | 90 days
Start: 2023-01-06 — End: ?

## 2023-01-06 MED ORDER — HYDROCHLOROTHIAZIDE 25 MG TABLET
ORAL_TABLET | 0 refills | 0 days
Start: 2023-01-06 — End: ?

## 2023-01-07 ENCOUNTER — Ambulatory Visit: Admit: 2023-01-07 | Discharge: 2023-01-07 | Payer: MEDICARE

## 2023-01-07 DIAGNOSIS — Z79899 Other long term (current) drug therapy: Secondary | ICD-10-CM | POA: Diagnosis not present

## 2023-01-07 DIAGNOSIS — L409 Psoriasis, unspecified: Secondary | ICD-10-CM | POA: Diagnosis not present

## 2023-01-07 MED ORDER — FLUOCINOLONE ACETONIDE OIL 0.01 % EAR DROPS
TOPICAL | 4 refills | 0.00000 days | Status: CP
Start: 2023-01-07 — End: ?

## 2023-01-07 NOTE — Unmapped (Signed)
Dermatology Note     Assessment and Plan     Psoriasis, Chronic problem, stable on Stelara - at patient's treatment goal   - Continue ustekinumab (STELARA) 45 mg/0.5 mL Syrg syringe; Inject the contents of 1 syringe (45 mg) under the skin every 12 weeks. Does not require refill today.  - Tolerating Stelara well  - Continue betamethasone valerate (VALISONE) 0.1 % ointment; Apply to thick areas of psoriasis twice daily as needed until smooth. Avoid face and skin folds. Does not require refill today.  - HOLD hydrocortisone 2.5 % ointment; Apply to psoriasis on ears up to twice daily as needed until smooth. Does not require refill today.  - START fluocinolone acetonide oil 0.01 % Drop; Apply 2 -4 drops to ears twice a day  Dispense: 20 mL; Refill: 4  - The proper use and side effects of topical steroids were discussed with the patient.  These include skin pigmentary changes, skin atrophy, telangiectasias,  and striae.  The patient knows to discontinue the steroids if any of these side effects occur.  - patient reports he may consider another medication at follow up if not completely clear    High risk medication use, Stelara  - Last quant gold 01/01/22  - will re-order today     The patient was advised to call for an appointment should any new, changing, or symptomatic lesions develop.     RTC: Return in about 6 months (around 07/07/2023) for Recheck. or sooner as needed     Chief Complaint     Chief Complaint   Patient presents with    Psoriasis     Arms ,knees ,and ear  no flares using stelara and betamethosone     HPI     Cory Bentley is a 62 y.o. male who presents as a returning patient (last seen 07/17/2022) [facility] for follow up of psoriasis.     Today, patient reports:  - overall psoriasis well-controlled, states he continues with Stelara injections q12 weeks. States he feels like is psoriasis is 8/10 controlled. Reports he uses his betamethasone seldomly, usually only a small amount on a Q-tip to his ears every 2-3 weeks.    The patient denies any other new or changing lesions or areas of concern.     Pertinent Past Medical History     Problem List       Psoriasis - Primary    Relevant Medications    fluocinolone acetonide oil 0.01 % Drop     Past Medical History, Family History, Social History, Medication List, Allergies, and Problem List were reviewed in the rooming section of Epic.     ROS: Other than symptoms mentioned in the HPI, no fevers, chills, or other skin complaints. no fevers, chills, or other skin complaints    Physical Examination     GENERAL: Well-appearing male in no acute distress, resting comfortably.  NEURO: Alert and oriented, answers questions appropriately  PSYCH: Normal mood and affect  RESP: No increased work of breathing  SKIN: Examination of the ears, face, neck, upper chest, upper back, bilateral upper extremities, bilateral lower extremities, and hands was performed    - Well circumscribed erythematous papules and plaques with overlying silvery scale on elbows, knees, ears, and lower extremities    All areas not commented on are within normal limits or unremarkable

## 2023-01-09 DIAGNOSIS — E11628 Type 2 diabetes mellitus with other skin complications: Principal | ICD-10-CM

## 2023-01-09 DIAGNOSIS — Z794 Long term (current) use of insulin: Principal | ICD-10-CM

## 2023-01-09 MED ORDER — JARDIANCE 10 MG TABLET
ORAL_TABLET | Freq: Every day | ORAL | 0 refills | 0 days
Start: 2023-01-09 — End: ?

## 2023-01-10 MED ORDER — JARDIANCE 10 MG TABLET
ORAL_TABLET | Freq: Every day | ORAL | 0 refills | 90 days | Status: CP
Start: 2023-01-10 — End: ?

## 2023-01-10 NOTE — Unmapped (Signed)
Patient is requesting the following refill  Requested Prescriptions     Pending Prescriptions Disp Refills    JARDIANCE 10 mg tablet [Pharmacy Med Name: JARDIANCE 10MG  TABLETS] 180 tablet 0     Sig: TAKE 2 TABLETS BY MOUTH EVERY DAY       Recent Visits  Date Type Provider Dept   11/29/22 Office Visit Amie Portland, FNP [facility] [address] [facility]   07/24/22 Office Visit Clapp, Magnus Ivan, FNP [facility] [address] [facility]   04/24/22 Office Visit Clapp, Magnus Ivan, FNP [facility] [facility] [address] [facility]   03/13/22 Office Visit Clapp, Magnus Ivan, FNP [facility] [address] [facility]   Showing recent visits within past 365 days and meeting all other requirements  Future Appointments  Date Type Provider Dept   03/03/23 Appointment Amie Portland, FNP [place] [facility] [address] [facility]   Showing future appointments within next 365 days and meeting all other requirements       Labs: A1c:   Hemoglobin A1C (%)   Date Value   03/13/2022 7.1 (H)   03/02/2021 7.1 (A)   03/27/2019 14.2 (H)    Creatinine:   Creatinine (mg/dL)   Date Value   16/01/9603 1.11   04/13/2020 1.0

## 2023-01-13 ENCOUNTER — Ambulatory Visit: Admit: 2023-01-13 | Payer: MEDICARE

## 2023-01-14 LAB — TB AG1: TB AG1 VALUE: 0.02

## 2023-01-14 LAB — QUANTIFERON TB GOLD PLUS
QUANTIFERON ANTIGEN 1 MINUS NIL: -0.01 [IU]/mL
QUANTIFERON ANTIGEN 2 MINUS NIL: -0.01 [IU]/mL
QUANTIFERON MITOGEN: 9.97 [IU]/mL
QUANTIFERON TB GOLD PLUS: NEGATIVE
QUANTIFERON TB NIL VALUE: 0.03 [IU]/mL

## 2023-01-14 LAB — TB MITOGEN: TB MITOGEN VALUE: 10

## 2023-01-14 LAB — TB NIL: TB NIL VALUE: 0.03

## 2023-01-14 LAB — TB AG2: TB AG2 VALUE: 0.02

## 2023-01-23 DIAGNOSIS — I10 Essential (primary) hypertension: Secondary | ICD-10-CM | POA: Diagnosis not present

## 2023-01-23 DIAGNOSIS — K219 Gastro-esophageal reflux disease without esophagitis: Secondary | ICD-10-CM | POA: Diagnosis not present

## 2023-01-23 DIAGNOSIS — R55 Syncope and collapse: Secondary | ICD-10-CM | POA: Diagnosis not present

## 2023-01-23 DIAGNOSIS — G629 Polyneuropathy, unspecified: Secondary | ICD-10-CM | POA: Diagnosis not present

## 2023-01-23 DIAGNOSIS — R079 Chest pain, unspecified: Secondary | ICD-10-CM | POA: Diagnosis not present

## 2023-01-23 DIAGNOSIS — I209 Angina pectoris, unspecified: Secondary | ICD-10-CM | POA: Diagnosis not present

## 2023-01-23 DIAGNOSIS — E1169 Type 2 diabetes mellitus with other specified complication: Secondary | ICD-10-CM | POA: Diagnosis not present

## 2023-01-23 DIAGNOSIS — E782 Mixed hyperlipidemia: Secondary | ICD-10-CM | POA: Diagnosis not present

## 2023-01-24 DIAGNOSIS — H353211 Exudative age-related macular degeneration, right eye, with active choroidal neovascularization: Secondary | ICD-10-CM | POA: Diagnosis not present

## 2023-01-24 DIAGNOSIS — E113312 Type 2 diabetes mellitus with moderate nonproliferative diabetic retinopathy with macular edema, left eye: Secondary | ICD-10-CM | POA: Diagnosis not present

## 2023-01-29 DIAGNOSIS — L409 Psoriasis, unspecified: Principal | ICD-10-CM

## 2023-01-29 DIAGNOSIS — Z79899 Other long term (current) drug therapy: Principal | ICD-10-CM

## 2023-01-29 MED ORDER — STELARA 45 MG/0.5 ML SUBCUTANEOUS SYRINGE
3 refills | 0 days
Start: 2023-01-29 — End: ?

## 2023-01-30 DIAGNOSIS — Z79899 Other long term (current) drug therapy: Principal | ICD-10-CM

## 2023-01-30 DIAGNOSIS — L409 Psoriasis, unspecified: Principal | ICD-10-CM

## 2023-01-30 MED ORDER — STELARA 45 MG/0.5 ML SUBCUTANEOUS SYRINGE
SUBCUTANEOUS | 3 refills | 0.00000 days | Status: CP
Start: 2023-01-30 — End: ?
  Filled 2023-02-07: qty 0.5, 84d supply, fill #0

## 2023-01-31 NOTE — Unmapped (Signed)
Slight return of psoriasis in ear/on arm towards end of dosing cycle. He's overall really happy with treatment.    [facility] Clinical Assessment & Refill Coordination Note    Cory Bentley, DOB: 07/22/1960  Phone: There are no phone numbers on file.    All above HIPAA information was verified with patient.     Was a Nurse, learning disability used for this call? No    Specialty Medication(s):   Inflammatory Disorders: Stelara     Current Outpatient Medications   Medication Sig Dispense Refill    alcohol swabs (ALCOHOL PADS) PadM Apply 1 swab topically four (4) times a day. 400 each 1    atorvastatin (LIPITOR) 40 MG tablet Take 1 tablet (40 mg total) by mouth daily. 90 tablet 2    BD ULTRA-FINE MINI PEN NEEDLE 31 gauge x 3/16 (5 mm) Ndle INJECT FOUR TIMES DAILY 400 each 1    betamethasone valerate (VALISONE) 0.1 % ointment APPLY TO THICK AREAS OF PSORIASIS TWICE DAILY AS NEEDED UNTIL SMOOTH AVOID FACE AND SKIN FOLDS 45 g 8    blood sugar diagnostic Strp Accu Check Aviva Strip, use 4 times per day for monitoring insulin dependant uncontrolled diabetes      blood-glucose meter kit Use as instructed 1 each 0    diaper,brief,adult,disposable Misc 1 each by Miscellaneous route two (2) times a day. 20 each 2    dulaglutide (TRULICITY) 1.5 mg/0.5 mL PnIj Inject 0.5 mL (1.5 mg total) under the skin every seven (7) days.      DULoxetine (CYMBALTA) 20 MG capsule       empty container Misc Use as directed to dispose of Stelara syringes. 1 each 2    flash glucose sensor (FREESTYLE LIBRE 14 DAY SENSOR) kit Apply new sensor every 14 days and use as directed to monitor and check blood sugars 2 each 12    fluocinolone acetonide oil 0.01 % Drop Apply 2 -4 drops to ears twice a day 20 mL 4    gabapentin (NEURONTIN) 800 MG tablet Take 1 tablet (800 mg total) by mouth four (4) times a day. 360 tablet 1    hydroCHLOROthiazide (HYDRODIURIL) 25 MG tablet Take 1 tablet (25 mg total) by mouth daily for 7 days. 7 tablet 0 hydrocortisone 2.5 % ointment APPLY TO PSORIASIS IN EARS UP TO TWICE DAILY AS NEEDED UNTIL SMOOTH 28.35 g 0    insulin aspart (NOVOLOG FLEXPEN U-100 INSULIN) 100 unit/mL (3 mL) injection pen Inject 0.1 mL (10 Units total) under the skin Three (3) times a day before meals. 27 mL 3    insulin detemir U-100 (LEVEMIR FLEXPEN) 100 unit/mL (3 mL) injection pen Inject 0.52 mL (52 Units total) under the skin daily. 45 mL 0    JARDIANCE 10 mg tablet TAKE 2 TABLETS BY MOUTH EVERY DAY 180 tablet 0    lisinopril (PRINIVIL,ZESTRIL) 40 MG tablet Take 1 tablet (40 mg total) by mouth daily. 90 tablet 1    omeprazole (PRILOSEC) 20 MG capsule TAKE 1 CAPSULE(20 MG) BY MOUTH DAILY 90 capsule 1    testosterone 20.25 mg/1.25 gram (1.62 %) gel pump PLACE 2 SPRAYS ON THE SKIN DAILY 3.65 g 3    ustekinumab (STELARA) 45 mg/0.5 mL Syrg syringe Inject the contents of 1 syringe (45 mg total) under the skin every 12 weeks. 2 mL 3     No current facility-administered medications for this visit.        Changes to medications: Che reports no  changes at this time.    No Known Allergies    Changes to allergies: No    SPECIALTY MEDICATION ADHERENCE     Stelara - 0 left  Medication Adherence    Patient reported X missed doses in the last month: 0  Specialty Medication: Stelara          Specialty medication(s) dose(s) confirmed: Regimen is correct and unchanged.     Are there any concerns with adherence? No    Adherence counseling provided? Not needed    CLINICAL MANAGEMENT AND INTERVENTION      Clinical Benefit Assessment:    Do you feel the medicine is effective or helping your condition? Yes    Clinical Benefit counseling provided? Not needed    Adverse Effects Assessment:    Are you experiencing any side effects? No    Are you experiencing difficulty administering your medicine? No    Quality of Life Assessment:    Quality of Life    Rheumatology  Oncology  Dermatology  1. What impact has your specialty medication had on the symptoms of your skin condition (i.e. itchiness, soreness, stinging)?: Tremendous  2. What impact has your specialty medication had on your comfort level with your skin?: Tremendous  Cystic Fibrosis          How many days over the past month did your psoriasis  keep you from your normal activities? For example, brushing your teeth or getting up in the morning. 0    Have you discussed this with your provider? Not needed    Acute Infection Status:    Acute infections noted within Epic:  No active infections  Patient reported infection: None    Therapy Appropriateness:    Is therapy appropriate based on current medication list, adverse reactions, adherence, clinical benefit and progress toward achieving therapeutic goals? Yes, therapy is appropriate and should be continued     DISEASE/MEDICATION-SPECIFIC INFORMATION      For patients on injectable medications: Patient currently has 0 doses left.  Next injection is scheduled for 10/31.    Chronic Inflammatory Diseases: Have you experienced any flares in the last month? No  Has this been reported to your provider? No    PATIENT SPECIFIC NEEDS     Does the patient have any physical, cognitive, or cultural barriers? No    Is the patient high risk? No    Did the patient require a clinical intervention? No    Does the patient require physician intervention or other additional services (i.e., nutrition, smoking cessation, social work)? No    SOCIAL DETERMINANTS OF HEALTH     At the [facility], we have learned that life circumstances - like trouble affording food, housing, utilities, or transportation can affect the health of many of our patients.   That is why we wanted to ask: are you currently experiencing any life circumstances that are negatively impacting your health and/or quality of life? Patient declined to answer    Social Determinants of Health     Food Insecurity: No Food Insecurity (07/24/2022)    Hunger Vital Sign     Worried About Running Out of Food in the Last Year: Never true     Ran Out of Food in the Last Year: Never true   Internet Connectivity: Possible Internet connectivity concern identified (09/05/2021)    Internet Connectivity     Do you have access to internet services: Yes     How do you connect to the internet:  Not on file     Is your internet connection strong enough for you to watch video on your device without major problems?: Yes     Do you have enough data to get through the month?: Yes     Does at least one of the devices have a camera that you can use for video chat?: Yes   Housing/Utilities: Low Risk  (07/24/2022)    Housing/Utilities     Within the past 12 months, have you ever stayed: outside, in a car, in a tent, in an overnight shelter, or temporarily in someone else's home (i.e. couch-surfing)?: No     Are you worried about losing your housing?: No     Within the past 12 months, have you been unable to get utilities (heat, electricity) when it was really needed?: No   Tobacco Use: Low Risk  (01/07/2023)    Patient History     Smoking Tobacco Use: Never     Smokeless Tobacco Use: Never     Passive Exposure: Never   Transportation Needs: No Transportation Needs (07/24/2022)    PRAPARE - Transportation     Lack of Transportation (Medical): No     Lack of Transportation (Non-Medical): No   Alcohol Use: Not At Risk (11/30/2020)    Alcohol Use     How often do you have a drink containing alcohol?: 4+ times per week     How many drinks containing alcohol do you have on a typical day when you are drinking?: 1 - 2     How often do you have 5 or more drinks on one occasion?: Never   Interpersonal Safety: Not At Risk (07/24/2022)    Interpersonal Safety     Unsafe Where You Currently Live: No     Physically Hurt by Anyone: No     Abused by Anyone: No   Physical Activity: Insufficiently Active (11/30/2020)    Exercise Vital Sign     Days of Exercise per Week: 3 days     Minutes of Exercise per Session: 40 min   Intimate Partner Violence: Not At Risk (11/30/2020)    Humiliation, Afraid, Rape, and Kick questionnaire     Fear of Current or Ex-Partner: No     Emotionally Abused: No     Physically Abused: No     Sexually Abused: No   Stress: No Stress Concern Present (11/30/2020)    [name] of Occupational Health - Occupational Stress Questionnaire     Feeling of Stress : Only a little   Substance Use: Low Risk  (11/30/2020)    Substance Use     Taken prescription drugs for non-medical reasons: Never     Taken illegal drugs: Never     Patient indicated they have taken drugs in the past year for non-medical reasons: Yes, [positive answer(s)]: Not on file   Social Connections: Unknown (11/30/2020)    Social Connection and Isolation Panel [NHANES]     Frequency of Communication with Friends and Family: More than three times a week     Frequency of Social Gatherings with Friends and Family: More than three times a week     Attends Religious Services: Patient declined     Active Member of Clubs or Organizations: Patient declined     Attends Banker Meetings: Patient declined     Marital Status: Divorced   Physicist, medical Strain: Low Risk  (07/24/2022)    Overall Financial Resource Strain (CARDIA)  Difficulty of Paying Living Expenses: Not hard at all   Depression: Not at risk (07/24/2022)    PHQ-2     PHQ-2 Score: 0   Health Literacy: Low Risk  (07/24/2022)    Health Literacy     : Never       Would you be willing to receive help with any of the needs that you have identified today? Not applicable       SHIPPING     Specialty Medication(s) to be Shipped:   Inflammatory Disorders: Stelara    Other medication(s) to be shipped: No additional medications requested for fill at this time     Changes to insurance: No    Delivery Scheduled: Yes, Expected medication delivery date: 10/25.     Medication will be delivered via UPS to the confirmed prescription address in [facility].    The patient will receive a drug information handout for each medication shipped and additional FDA Medication Guides as required.  Verified that patient has previously received a Conservation officer, historic buildings and a Surveyor, mining.    The patient or caregiver noted above participated in the development of this care plan and knows that they can request review of or adjustments to the care plan at any time.      All of the patient's questions and concerns have been addressed.    Mik[facility] Specialty Pharmacist

## 2023-02-12 DIAGNOSIS — R202 Paresthesia of skin: Secondary | ICD-10-CM | POA: Diagnosis not present

## 2023-02-12 DIAGNOSIS — M21371 Foot drop, right foot: Secondary | ICD-10-CM | POA: Diagnosis not present

## 2023-02-12 DIAGNOSIS — R262 Difficulty in walking, not elsewhere classified: Secondary | ICD-10-CM | POA: Diagnosis not present

## 2023-02-12 DIAGNOSIS — G629 Polyneuropathy, unspecified: Secondary | ICD-10-CM | POA: Diagnosis not present

## 2023-02-12 DIAGNOSIS — R2689 Other abnormalities of gait and mobility: Secondary | ICD-10-CM | POA: Diagnosis not present

## 2023-02-12 DIAGNOSIS — R200 Anesthesia of skin: Secondary | ICD-10-CM | POA: Diagnosis not present

## 2023-02-12 DIAGNOSIS — M21372 Foot drop, left foot: Secondary | ICD-10-CM | POA: Diagnosis not present

## 2023-02-23 ENCOUNTER — Ambulatory Visit: Admit: 2023-02-23 | Discharge: 2023-02-23 | Disposition: A | Discharge status: Home / Self Care | Payer: MEDICARE

## 2023-02-23 ENCOUNTER — Emergency Department: Admit: 2023-02-23 | Discharge: 2023-02-23 | Disposition: A | Discharge status: Home / Self Care | Payer: MEDICARE

## 2023-02-23 DIAGNOSIS — K219 Gastro-esophageal reflux disease without esophagitis: Secondary | ICD-10-CM | POA: Diagnosis not present

## 2023-02-23 DIAGNOSIS — Z7984 Long term (current) use of oral hypoglycemic drugs: Secondary | ICD-10-CM | POA: Diagnosis not present

## 2023-02-23 DIAGNOSIS — M25474 Effusion, right foot: Secondary | ICD-10-CM | POA: Diagnosis not present

## 2023-02-23 DIAGNOSIS — L84 Corns and callosities: Secondary | ICD-10-CM | POA: Diagnosis not present

## 2023-02-23 DIAGNOSIS — E1142 Type 2 diabetes mellitus with diabetic polyneuropathy: Secondary | ICD-10-CM | POA: Diagnosis not present

## 2023-02-23 DIAGNOSIS — M19071 Primary osteoarthritis, right ankle and foot: Secondary | ICD-10-CM | POA: Diagnosis not present

## 2023-02-23 DIAGNOSIS — E875 Hyperkalemia: Secondary | ICD-10-CM | POA: Diagnosis not present

## 2023-02-23 DIAGNOSIS — Z89421 Acquired absence of other right toe(s): Secondary | ICD-10-CM | POA: Diagnosis not present

## 2023-02-23 DIAGNOSIS — Z79899 Other long term (current) drug therapy: Secondary | ICD-10-CM | POA: Diagnosis not present

## 2023-02-23 DIAGNOSIS — E1165 Type 2 diabetes mellitus with hyperglycemia: Secondary | ICD-10-CM | POA: Diagnosis not present

## 2023-02-23 DIAGNOSIS — L409 Psoriasis, unspecified: Secondary | ICD-10-CM | POA: Diagnosis not present

## 2023-02-23 DIAGNOSIS — Z7985 Long-term (current) use of injectable non-insulin antidiabetic drugs: Secondary | ICD-10-CM | POA: Diagnosis not present

## 2023-02-23 DIAGNOSIS — E1143 Type 2 diabetes mellitus with diabetic autonomic (poly)neuropathy: Secondary | ICD-10-CM | POA: Diagnosis not present

## 2023-02-23 DIAGNOSIS — S92411A Displaced fracture of proximal phalanx of right great toe, initial encounter for closed fracture: Secondary | ICD-10-CM | POA: Diagnosis not present

## 2023-02-23 DIAGNOSIS — L608 Other nail disorders: Secondary | ICD-10-CM | POA: Diagnosis not present

## 2023-02-23 DIAGNOSIS — M79674 Pain in right toe(s): Secondary | ICD-10-CM | POA: Diagnosis not present

## 2023-02-23 DIAGNOSIS — S92911A Unspecified fracture of right toe(s), initial encounter for closed fracture: Secondary | ICD-10-CM | POA: Diagnosis not present

## 2023-02-23 DIAGNOSIS — K3184 Gastroparesis: Secondary | ICD-10-CM | POA: Diagnosis not present

## 2023-02-23 DIAGNOSIS — L03031 Cellulitis of right toe: Secondary | ICD-10-CM | POA: Diagnosis not present

## 2023-02-23 DIAGNOSIS — E785 Hyperlipidemia, unspecified: Secondary | ICD-10-CM | POA: Diagnosis not present

## 2023-02-23 DIAGNOSIS — I10 Essential (primary) hypertension: Secondary | ICD-10-CM | POA: Diagnosis not present

## 2023-02-23 DIAGNOSIS — Z794 Long term (current) use of insulin: Secondary | ICD-10-CM | POA: Diagnosis not present

## 2023-02-23 LAB — COMPREHENSIVE METABOLIC PANEL
ALBUMIN: 3.5 g/dL (ref 3.4–5.0)
ALKALINE PHOSPHATASE: 77 U/L (ref 46–116)
ALT (SGPT): 17 U/L (ref 10–49)
ANION GAP: 7 mmol/L (ref 5–14)
AST (SGOT): 21 U/L (ref <=34)
BILIRUBIN TOTAL: 0.3 mg/dL (ref 0.3–1.2)
BLOOD UREA NITROGEN: 35 mg/dL — ABNORMAL HIGH (ref 9–23)
BUN / CREAT RATIO: 32
CALCIUM: 9.8 mg/dL (ref 8.7–10.4)
CHLORIDE: 106 mmol/L (ref 98–107)
CO2: 27.9 mmol/L (ref 20.0–31.0)
CREATININE: 1.11 mg/dL
EGFR CKD-EPI (2021) MALE: 76 mL/min/{1.73_m2} (ref >=60)
GLUCOSE RANDOM: 182 mg/dL — ABNORMAL HIGH (ref 70–179)
POTASSIUM: 5.7 mmol/L — ABNORMAL HIGH (ref 3.4–4.8)
PROTEIN TOTAL: 7.5 g/dL (ref 5.7–8.2)
SODIUM: 141 mmol/L (ref 135–145)

## 2023-02-23 LAB — CBC W/ AUTO DIFF
BASOPHILS ABSOLUTE COUNT: 0 10*9/L (ref 0.0–0.1)
BASOPHILS RELATIVE PERCENT: 0.5 %
EOSINOPHILS ABSOLUTE COUNT: 0.5 10*9/L (ref 0.0–0.5)
EOSINOPHILS RELATIVE PERCENT: 5.7 %
HEMATOCRIT: 35.9 % — ABNORMAL LOW (ref 39.0–48.0)
HEMOGLOBIN: 11.5 g/dL — ABNORMAL LOW (ref 12.9–16.5)
LYMPHOCYTES ABSOLUTE COUNT: 1.9 10*9/L (ref 1.1–3.6)
LYMPHOCYTES RELATIVE PERCENT: 20.4 %
MEAN CORPUSCULAR HEMOGLOBIN CONC: 31.9 g/dL — ABNORMAL LOW (ref 32.0–36.0)
MEAN CORPUSCULAR HEMOGLOBIN: 29.5 pg (ref 25.9–32.4)
MEAN CORPUSCULAR VOLUME: 92.4 fL (ref 77.6–95.7)
MEAN PLATELET VOLUME: 11.9 fL — ABNORMAL HIGH (ref 6.8–10.7)
MONOCYTES ABSOLUTE COUNT: 1.2 10*9/L — ABNORMAL HIGH (ref 0.3–0.8)
MONOCYTES RELATIVE PERCENT: 13.3 %
NEUTROPHILS ABSOLUTE COUNT: 5.5 10*9/L (ref 1.8–7.8)
NEUTROPHILS RELATIVE PERCENT: 60.1 %
NUCLEATED RED BLOOD CELLS: 0 /100{WBCs} (ref <=4)
PLATELET COUNT: 164 10*9/L (ref 150–450)
RED BLOOD CELL COUNT: 3.89 10*12/L — ABNORMAL LOW (ref 4.26–5.60)
RED CELL DISTRIBUTION WIDTH: 13 % (ref 12.2–15.2)
WBC ADJUSTED: 9.2 10*9/L (ref 3.6–11.2)

## 2023-02-23 LAB — MAGNESIUM: MAGNESIUM: 2.2 mg/dL (ref 1.6–2.6)

## 2023-02-23 LAB — SEDIMENTATION RATE: ERYTHROCYTE SEDIMENTATION RATE: 91 mm/h — ABNORMAL HIGH (ref 0–20)

## 2023-02-23 LAB — C-REACTIVE PROTEIN: C-REACTIVE PROTEIN: 17 mg/L — ABNORMAL HIGH (ref <=10.0)

## 2023-02-23 MED ORDER — CEPHALEXIN 500 MG CAPSULE
ORAL_CAPSULE | Freq: Four times a day (QID) | ORAL | 0 refills | 10 days | Status: CP
Start: 2023-02-23 — End: 2023-03-05

## 2023-02-23 MED ADMIN — sodium chloride 0.9% (NS) bolus 500 mL: 500 mL | INTRAVENOUS | @ 16:00:00 | Stop: 2023-02-23

## 2023-02-23 MED ADMIN — gadopiclenol injection 8 mL: 8 mL | INTRAVENOUS | @ 14:00:00 | Stop: 2023-02-23

## 2023-02-23 MED ADMIN — vancomycin (VANCOCIN) 1500 mg in dextrose 5 % 300 mL IVPB (premix): 1500 mg | INTRAVENOUS | @ 17:00:00 | Stop: 2023-02-23

## 2023-02-23 MED ADMIN — HYDROmorphone (PF) (DILAUDID) injection 1 mg: 1 mg | INTRAVENOUS | @ 16:00:00 | Stop: 2023-02-23

## 2023-02-23 MED ADMIN — cefepime (MAXIPIME) 2 g in sodium chloride 0.9 % (NS) 100 mL IVPB-MBP: 2 g | INTRAVENOUS | @ 16:00:00 | Stop: 2023-02-23

## 2023-02-23 NOTE — Unmapped (Signed)
ORTHOPAEDIC CONSULT  - Primary Service for this Patient: Emergency Medicine.  Patient is seen in consultation at the request of [facility] ED for evaluation of the following:          ASSESSMENT AND PLAN:  Cory Bentley is a 62 y.o. male with DM, prior R 2nd toe amp w/ podiatry, neuropathy     Orthopedic surgery was consulted for:     1) Right 1st toe proximal phalanx fx   - Closed, ecchymosis present. Baseline severe neuropathy with no sensation to pinprick on bilateral feet  - Recommend post op shoe  - CRP 17 ESR 91 WBC 9.3  - Recommend podiatry evaluation if concerned about osteomyelitis/cellulitis/infection given elevated inflammatory markers, baseline neuropathy, and previous infection  - Recommend keflex antibiotics for 7 days, gave patient strict return precautions if develops fever/chills, spreading erythema   - operative treatment is not anticipated  - Weight Bearing Status/Activity: weightbearing as tolerated on the right lower extremity.  - Recommended Additional Labs: No new labs needed.  - Pain control: per ED.  - Tobacco use: None  - Best contact number: There are no phone numbers on file.   - Follow-up plan: Follow up will be scheduled in our foot and ankle orthopaedic APP clinic    This patient discussed with senior on call resident, consult will be staffed with on-call attending.    * Please contact the resident who leaves daily progress notes for any questions while patient is inpatient during weekdays.  * Please page orthopaedic consult pager [phone]) on nights (after 5PM) and weekends.    PROCEDURE(S)   none    SUBJECTIVE     Reason for Consult/Chief Complaint:  right foot swelling    History of Present Illness:   (E.g., location, quality, severity, timing, duration, context, modifying factors, associated symptoms; relevant review of systems)  Cory Bentley is a 62 y.o.  male with relevant PMH as below who presents after he noticed his right great toe became swollen and had a blister on it.  Patient denies any injury or pain to the foot. At baseline he has neuropathy and cannot fell his feet. Patient states he could have hit his foot on a step going up the stairs to his apartment. 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 He also notes he had an infection to his left thumb 8 years prior that required surgery.     Medical History   Past Medical History:   Diagnosis Date    Cellulitis of left foot 07/24/2021    Diabetes mellitus (CMS-HCC)     Gastroparesis due to DM (CMS-HCC) 09/09/2019    Hypertension     Psoriasis     Substance abuse (CMS-HCC)       Surgical History   Past Surgical History:   Procedure Laterality Date    PR ANESTH,CLEFT LIP REPAIR Right 04/20/2018    Procedure: DEBRIDEMENT; SKIN, SUBCUTANEOUS TISSUE, & MUSCLE HAND and FINGER wounds;  Surgeon: Boris Sharper, MD;  Location: MAIN OR [place];  Service: Plastics    PR APPLY FOREARM SPLINT,STATIC Right 05/05/2018    Procedure: application right short arm ulnar gutter splint;  Surgeon: Boris Sharper, MD;  Location: MAIN OR [place];  Service: Plastics    PR APPLY FOREARM SPLINT,STATIC Right 05/11/2018    Procedure: Applic Short Arm Splint; Static;  Surgeon: Boris Sharper, MD;  Location: MAIN OR [facility];  Service: Plastics  PR DEBRIDEMENT MUSCLE &/FASCIA 1ST 20 SQ CM/< Right 04/27/2018    Procedure: debridement right palmar wound and dorsal right small finger wound;  Surgeon: Boris Sharper, MD;  Location: MAIN OR [place];  Service: Plastics    PR FTH/GF FR W/DIR CLSR F/C/C/M/N/AX/G/H/F 20SQCM/< Right 05/11/2018    Procedure: Full thickness skin graft to right palmar hand and right small finger wounds;  Surgeon: Boris Sharper, MD;  Location: MAIN OR [place];  Service: Plastics    PR NEGATIVE PRESSURE WOUND THERAPY DME <= 50 SQ CM Right 04/22/2018    Procedure: Right Hand Wound Vac Change;  Surgeon: Andreas Newport, MD;  Location: MAIN OR [place];  Service: Plastics    PR SUB GRFT F/S/N/H/F/G/M/D /<100SCM /<1ST 25 SCM Right 05/05/2018    Procedure: application allograft human skin to right hand and right small finger wounds;  Surgeon: Boris Sharper, MD;  Location: MAIN OR [facility];  Service: Plastics    PR WOUND PREP, PED, TRK/ARM/LG 1ST 100 CM Right 05/05/2018    Procedure: Debridement right hand and small finger wounds in preparation for skin grafting;  Surgeon: Boris Sharper, MD;  Location: MA[facility] OR [facility];  Service: Plastics    PR WOUND PREP, PED, TRK/ARM/LG 1ST 100 CM Right 05/11/2018    Procedure: Surg Preparation/Creation Of Recipient Site By Excision Of Open Wound/Burn Eschar/Scar, Or Incisional Release Of Scar Contracture, Trunk/Arms/Legs; 1st 100 Sq Cm Or 1% Body Area Of Infants & Children;  Surgeon: Boris Sharper, MD;  Location: MAIN OR [place];  Service: Plastics    Right toe amputation        Medications   No current facility-administered medications for this encounter.     Current Outpatient Medications   Medication Sig Dispense Refill    alcohol swabs (ALCOHOL PADS) PadM Apply 1 swab topically four (4) times a day. 400 each 1    atorvastatin (LIPITOR) 40 MG tablet Take 1 tablet (40 mg total) by mouth daily. 90 tablet 2    BD ULTRA-FINE MINI PEN NEEDLE 31 gauge x 3/16 (5 mm) Ndle INJECT FOUR TIMES DAILY 400 each 1    betamethasone valerate (VALISONE) 0.1 % ointment APPLY TO THICK AREAS OF PSORIASIS TWICE DAILY AS NEEDED UNTIL SMOOTH AVOID FACE AND SKIN FOLDS 45 g 8    blood sugar diagnostic Strp Accu Check Aviva Strip, use 4 times per day for monitoring insulin dependant uncontrolled diabetes      blood-glucose meter kit Use as instructed 1 each 0    cephalexin (KEFLEX) 500 MG capsule Take 1 capsule (500 mg total) by mouth four (4) times a day for 10 days. 40 capsule 0    diaper,brief,adult,disposable Misc 1 each by Miscellaneous route two (2) times a day. 20 each 2    dulaglutide (TRULICITY) 1.5 mg/0.5 mL PnIj Inject 0.5 mL (1.5 mg total) under the skin every seven (7) days.      DULoxetine (CYMBALTA) 20 MG capsule       empty container Misc Use as directed to dispose of Stelara syringes. 1 each 2    flash glucose sensor (FREESTYLE LIBRE 14 DAY SENSOR) kit Apply new sensor every 14 days and use as directed to monitor and check blood sugars 2 each 12    fluocinolone acetonide oil 0.01 % Drop Apply 2 -4 drops to ears twice a day 20 mL 4    gabapentin (NEURONTIN) 800 MG tablet Take 1 tablet (800 mg total) by mouth four (4) times a day. 360 tablet  1    hydroCHLOROthiazide (HYDRODIURIL) 25 MG tablet Take 1 tablet (25 mg total) by mouth daily for 7 days. 7 tablet 0    hydrocortisone 2.5 % ointment APPLY TO PSORIASIS IN EARS UP TO TWICE DAILY AS NEEDED UNTIL SMOOTH 28.35 g 0    insulin aspart (NOVOLOG FLEXPEN U-100 INSULIN) 100 unit/mL (3 mL) injection pen Inject 0.1 mL (10 Units total) under the skin Three (3) times a day before meals. 27 mL 3    insulin detemir U-100 (LEVEMIR FLEXPEN) 100 unit/mL (3 mL) injection pen Inject 0.52 mL (52 Units total) under the skin daily. 45 mL 0    JARDIANCE 10 mg tablet TAKE 2 TABLETS BY MOUTH EVERY DAY 180 tablet 0    lisinopril (PRINIVIL,ZESTRIL) 40 MG tablet Take 1 tablet (40 mg total) by mouth daily. 90 tablet 1    omeprazole (PRILOSEC) 20 MG capsule TAKE 1 CAPSULE(20 MG) BY MOUTH DAILY 90 capsule 1    testosterone 20.25 mg/1.25 gram (1.62 %) gel pump PLACE 2 SPRAYS ON THE SKIN DAILY 3.65 g 3    ustekinumab (STELARA) 45 mg/0.5 mL Syrg syringe Inject the contents of 1 syringe (45 mg total) under the skin every 12 weeks. 2 mL 3      Allergies   Patient has no known allergies.     Social History Employment: on disability   Tobacco use:   Social History     Tobacco Use   Smoking Status Never    Passive exposure: Never   Smokeless Tobacco Never   .  Social History     Social History Narrative    Hx of cocaine abuse.  Denies tobacco.         Family History   Family History   Problem Relation Age of Onset    Diabetes Mother     Diabetes type II Mother     Ovarian cancer Mother Diabetes Father     Diabetes type II Father     Diabetes type II Sister     Diabetes type II Brother     Diabetes type II Maternal Grandmother     Diabetes type II Maternal Grandfather     Diabetes type II Paternal Grandmother     Diabetes type II Paternal Grandfather     Melanoma Neg Hx     Basal cell carcinoma Neg Hx     Squamous cell carcinoma Neg Hx            OBJECTIVE     PHYSICAL EXAM   Constitutional   Vitals  Estimated body mass index is 27.22 kg/m?? as calculated from the following:    Height as of this encounter: 172.7 cm (5' 8).    Weight as of this encounter: 81.2 kg (179 lb).   Vitals:    02/23/23 1113   BP: 150/85   Pulse: 87   Resp: 16   Temp: 36.8 ??C (98.3 ??F)   SpO2: 100%        General appearance  well-nourished, no acute distress   Musculoskeletal (relevant only; consider injured extremity + contralateral for comparison; consider all extremities + pelvis for high energy trauma)   Extremities:  RLE: Expected level of swelling. Chymosin present to the great toe with slight erythema. No palpable fluctuance. No purulence or drainage. Skin intact. Crepitus on exam. No tenting, impending open fracture. Nontender to palpation, with full and painless ROM throughout. + GS/TA/EHL. Unable to discriminate two point discrimination to the sural/saphenous/SPN/DPN. No sensation overlying  the metatarsal heads on the plantar surface. 2+ DP pulse with warm and well perfused toes. Compartments soft and compressible, with no pain on passive stretch.             Test Results  Imaging  Radiology studies were personally reviewed.  XR Right toes: acute vs chronic fracture of the first proximal phalanx.     Labs  Lab Results   Component Value Date    WBC 9.2 02/23/2023    HGB 11.5 (L) 02/23/2023    HCT 35.9 (L) 02/23/2023    PLT 164 02/23/2023      No results for input(s): SPECTYPEART, PHART, PCO2ART, PO2ART, HCO3ART, BEART, O2SATART in the last 24 hours.   Lab Results   Component Value Date    NA 141 02/23/2023    K 5.7 (H) 02/23/2023    GLU 182 (H) 02/23/2023     Lab Results   Component Value Date    PT 10.5 07/24/2021    INR 1.02 07/24/2021    APTT 27.3 07/24/2021     Lab Results   Component Value Date    ESR 91 (H) 02/23/2023    CRP 17.0 (H) 02/23/2023     No results found for: ALB                  Comorbidities  See primary team documentation, or consult notes from medical or trauma services.    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

## 2023-02-23 NOTE — Unmapped (Signed)
Orthopaedic Treatment Plan    The [facility] ED called regarding this patient.    Per the Provider; patient with couple days of pain at the right great toe.  Presented to [facility] emergency department found to have elevated inflammatory markers, CRP 17 ESR 91 white blood cell count 9.3, per provider has pain redness about the right great toe proximal to the toenail.  Has history of diabetic neuropathy, had a previous second toe amputation in the setting of osteomyelitis.    Orthopedics was consulted for potential right proximal phalanx first digit fracture versus osteomyelitis.  Given imaging and clinical picture most likely related to ostial infection.    We recommended reaching out to podiatry or vascular surgery who previously amputated the second digit on the same foot.    Imaging:   I personally reviewed the imaging which show chronic fracture versus osteomyelitis of the right first digit distal proximal phalanx    ASSESSMENT AND PLAN:  Cory Bentley is a 62 y.o. male with:    1) presumed osteomyelitis of the right foot first digit proximal phalanx   -Pain at the right toe  - Recommend reaching out to podiatry versus vascular surgery for osteomyelitis treatment  - Weight Bearing Status/Activity: weightbearing as tolerated on the right lower extremity.  - Recommended Additional Labs: No new labs needed.      Fayette Pho, MD  [facility]    Patient and treatment plan was discussed and agreed with oncall senior resident, Delorse Limber , MD        *The history and physical examination of the patient is as reported by the ED provider, as I was not present to examine this patient.

## 2023-02-23 NOTE — Unmapped (Signed)
ED Progress Note    Patient arrived from [facility] for evaluation of infected ulcer. MRI w/ no evidence of OM. Easily palpable DP and PT pulses. Will discuss patient w/ ortho for final disposition.

## 2023-02-23 NOTE — Unmapped (Signed)
Patient ambulatory with reports of right big toe discoloration with some swelling. Pt noticed a blister this morning however discoloration started yesterday. Denies known injury however states is diabetic and neuropathy is severe.

## 2023-02-23 NOTE — Unmapped (Signed)
[facility]  Emergency Department Provider Note      ED Clinical Impression      Final diagnoses:   None            Impression, Medical Decision Making, Progress Notes and Critical Care      Impression, Differential Diagnosis and Plan of Care    Patient is a 62 y.o. male with PMH of HTN, HLD, T2DM w/ peripheral neuropathy, and GERD presenting for right great toe pain and discoloration at the base of the toenail since yesterday.     On exam, the patient appears alert and in no acute distress. VS are remarkable for hypertension to 190/90, otherwise within normal limits. Physical exam revealed right second toe amputated. Right great toe is red, warm, and with purple discoloration proximal to the toenail (see image below). Decreased sensation to the bilateral plantar surfaces of the feet at baseline. Brisk distal pulses bilaterally.     Plan for XR R Toes, MRI RLE, basic labs, magnesium, CRP, and sed rate.       Discussion of Management with other Physicians, QHP or Appropriate Source: Orthopedics and Vascular Surgery  Independent Interpretation of Studies: Xray, MRI  External Records Reviewed: Patient's most recent outpatient clinic note - 02/12/2023 [facility] Note: Reviewed patient's medical history  Escalation of Care, Consideration of Admission/Observation/Transfer: Transfer to [facility] for further evaluation by Vascular Surgery  Social determinants that significantly affected care: None applicable  Prescription drug(s) considered but not prescribed: N/A  Diagnostic tests considered but not performed: N/A  History obtained from other sources: None    Additional Progress Notes    9:50 AM  MRI RLE re-demonstrating acute on chronic fracture deformity of the intra-articular first proximal phalanx with surrounding soft tissue swelling.     10:06 AM  Spoke with Alene Mires of Orthopedics. He agrees that imaging is concerning for osteomyelitis, however shares orthopedics is not responsible for osteomyelitis and recommended vascular surgery/podiatry consult. Will page vascular surgery.    10:15 AM  Spoke with junior surgery resident on-call for vascular surgery. They shared we should consult the vascular surgery fellow.    10:20 AM  Vascular surgery fellow called, stating we have to consult them via [facility]. Called [facility] for vascular consult who shares we can page vascular surgery directly, however they will help with connecting [place]. Awaiting call back from vascular surgery.    10:53 AM  Spoke to vascular surgery who requests ED-to-ED transfer. Patient was accepted for transfer by Dr. Willey Blade. Patient amenable. NPO, abx, IVF given slight hyperkalemia and hyperglycemia.    Portions of this record have been created using Scientist, clinical (histocompatibility and immunogenetics). Dictation errors have been sought, but may not have been identified and corrected.    See chart and resident provider documentation for details.    ____________________________________________         History        Reason for Visit  Toe Pain      HPI   Cory Bentley is a 62 y.o. male with a past medical history of HTN, HLD, T2DM w/ peripheral neuropathy, and GERD who presents to the ED for evaluation of toe pain. The patient reports right great toe pain and discoloration at the base of the toenail since yesterday. He has a history of diabetic neuropathy and has limited sensation to his feet, but routinely checks his feet for injuries. He is status post amputation of the right second toe. No recent trauma or injuries to the  toe.     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, with long-term current use of insulin (CMS-HCC)    Primary hypertension    Gastroesophageal reflux disease Thrombocytosis    Marijuana abuse    Psoriasis    Diabetic polyneuropathy associated with type 2 diabetes mellitus (CMS-HCC)    Iron deficiency anemia    History of skin graft    History of colonic polyps    Family history of colon cancer    Hyperlipidemia associated with type 2 diabetes mellitus (CMS-HCC)    Risk for falls    Low testosterone in male    Anemia    Hyperkalemia       Past Surgical History:   Procedure Laterality Date    PR ANESTH,CLEFT LIP REPAIR Right 04/20/2018    Procedure: DEBRIDEMENT; SKIN, SUBCUTANEOUS TISSUE, & MUSCLE HAND and FINGER wounds;  Surgeon: Boris Sharper, MD;  Location: MAIN OR [place];  Service: Plastics    PR APPLY FOREARM SPLINT,STATIC Right 05/05/2018    Procedure: application right short arm ulnar gutter splint;  Surgeon: Boris Sharper, MD;  Location: MAIN OR [place];  Service: Plastics    PR APPLY FOREARM SPLINT,STATIC Right 05/11/2018    Procedure: Applic Short Arm Splint; Static;  Surgeon: Boris Sharper, MD;  Location: MAIN OR [place];  Service: Plastics    PR DEBRIDEMENT MUSCLE &/FASCIA 1ST 20 SQ CM/< Right 04/27/2018    Procedure: debridement right palmar wound and dorsal right small finger wound;  Surgeon: Boris Sharper, MD;  Location: MAIN OR [place];  Service: Plastics    PR FTH/GF FR W/DIR CLSR F/C/C/M/N/AX/G/H/F 20SQCM/< Right 05/11/2018    Procedure: Full thickness skin graft to right palmar hand and right small finger wounds;  Surgeon: Boris Sharper, MD;  Location: MAIN OR [place];  Service: Plastics    PR NEGATIVE PRESSURE WOUND THERAPY DME <= 50 SQ CM Right 04/22/2018    Procedure: Right Hand Wound Vac Change;  Surgeon: Andreas Newport, MD;  Location: MAIN OR [place];  Service: Plastics    PR SUB GRFT F/S/N/H/F/G/M/D /<100SCM /<1ST 25 SCM Right 05/05/2018    Procedure: application allograft human skin to right hand and right small finger wounds;  Surgeon: Boris Sharper, MD;  Location: MAIN OR [place];  Service: Plastics    PR WOUND PREP, PED, TRK/ARM/LG 1ST 100 CM Right 05/05/2018    Procedure: Debridement right hand and small finger wounds in preparation for skin grafting;  Surgeon: Boris Sharper, MD;  Location: MAIN OR [facility];  Service: Plastics    PR WOUND PREP, PED, TRK/ARM/LG 1ST 100 CM Right 05/11/2018    Procedure: Surg Preparation/Creation Of Recipient Site By Excision Of Open Wound/Burn Eschar/Scar, Or Incisional Release Of Scar Contracture, Trunk/Arms/Legs; 1st 100 Sq Cm Or 1% Body Area Of Infants & Children;  Surgeon: Boris Sharper, MD;  Location: MAIN OR [place];  Service: Plastics    Right toe amputation           Current Facility-Administered Medications:     sodium chloride 0.9% (NS) bolus 500 mL, 500 mL, Intravenous, Once, Harrel Ferrone, Jennye Boroughs, MD, Last  Rate: 500 mL/hr at 02/23/23 1118, 500 mL at 02/23/23 1118    vancomycin (VANCOCIN) 1500 mg in dextrose 5 % 300 mL IVPB (premix), 1,500 mg, Intravenous, Once, Arhianna Ebey, Jennye Boroughs, MD, Last Rate: 200 mL/hr at 02/23/23 1137, 1,500 mg at 02/23/23 1137    Current Outpatient Medications:     alcohol swabs (ALCOHOL PADS) PadM, Apply 1 swab topically four (4) times a day., Disp: 400 each, Rfl: 1    atorvastatin (LIPITOR) 40 MG tablet, Take 1 tablet (40 mg total) by mouth daily., Disp: 90 tablet, Rfl: 2    BD ULTRA-FINE MINI PEN NEEDLE 31 gauge x 3/16 (5 mm) Ndle, INJECT FOUR TIMES DAILY, Disp: 400 each, Rfl: 1    betamethasone valerate (VALISONE) 0.1 % ointment, APPLY TO THICK AREAS OF PSORIASIS TWICE DAILY AS NEEDED UNTIL SMOOTH AVOID FACE AND SKIN FOLDS, Disp: 45 g, Rfl: 8    blood sugar diagnostic Strp, Accu Check Aviva Strip, use 4 times per day for monitoring insulin dependant uncontrolled diabetes, Disp: , Rfl:     blood-glucose meter kit, Use as instructed, Disp: 1 each, Rfl: 0    diaper,brief,adult,disposable Misc, 1 each by Miscellaneous route two (2) times a day., Disp: 20 each, Rfl: 2    dulaglutide (TRULICITY) 1.5 mg/0.5 mL PnIj, Inject 0.5 mL (1.5 mg total) under the skin every seven (7) days., Disp: , Rfl:     DULoxetine (CYMBALTA) 20 MG capsule, , Disp: , Rfl:     empty container Misc, Use as directed to dispose of Stelara syringes., Disp: 1 each, Rfl: 2    flash glucose sensor (FREESTYLE LIBRE 14 DAY SENSOR) kit, Apply new sensor every 14 days and use as directed to monitor and check blood sugars, Disp: 2 each, Rfl: 12    fluocinolone acetonide oil 0.01 % Drop, Apply 2 -4 drops to ears twice a day, Disp: 20 mL, Rfl: 4    gabapentin (NEURONTIN) 800 MG tablet, Take 1 tablet (800 mg total) by mouth four (4) times a day., Disp: 360 tablet, Rfl: 1    hydroCHLOROthiazide (HYDRODIURIL) 25 MG tablet, Take 1 tablet (25 mg total) by mouth daily for 7 days., Disp: 7 tablet, Rfl: 0    hydrocortisone 2.5 % ointment, APPLY TO PSORIASIS IN EARS UP TO TWICE DAILY AS NEEDED UNTIL SMOOTH, Disp: 28.35 g, Rfl: 0    insulin aspart (NOVOLOG FLEXPEN U-100 INSULIN) 100 unit/mL (3 mL) injection pen, Inject 0.1 mL (10 Units total) under the skin Three (3) times a day before meals., Disp: 27 mL, Rfl: 3    insulin detemir U-100 (LEVEMIR FLEXPEN) 100 unit/mL (3 mL) injection pen, Inject 0.52 mL (52 Units total) under the skin daily., Disp: 45 mL, Rfl: 0    JARDIANCE 10 mg tablet, TAKE 2 TABLETS BY MOUTH EVERY DAY, Disp: 180 tablet, Rfl: 0    lisinopril (PRINIVIL,ZESTRIL) 40 MG tablet, Take 1 tablet (40 mg total) by mouth daily., Disp: 90 tablet, Rfl: 1    omeprazole (PRILOSEC) 20 MG capsule, TAKE 1 CAPSULE(20 MG) BY MOUTH DAILY, Disp: 90 capsule, Rfl: 1    testosterone 20.25 mg/1.25 gram (1.62 %) gel pump, PLACE 2 SPRAYS ON THE SKIN DAILY, Disp: 3.65 g, Rfl: 3    ustekinumab (STELARA) 45 mg/0.5 mL Syrg syringe, Inject the contents of 1 syringe (45 mg total) under the skin every 12 weeks., Disp: 2 mL, Rfl: 3    Allergies  Patient has no known allergies.    Family History   Problem Relation  Age of Onset    Diabetes Mother     Diabetes type II Mother     Ovarian cancer Mother     Diabetes Father     Diabetes type II Father     Diabetes type II Sister     Diabetes type II Brother     Diabetes type II Maternal Grandmother     Diabetes type II Maternal Grandfather     Diabetes type II Paternal Grandmother     Diabetes type II Paternal Grandfather     Melanoma Neg Hx     Basal cell carcinoma Neg Hx     Squamous cell carcinoma Neg Hx        Social History  Social History     Tobacco Use    Smoking status: Never     Passive exposure: Never    Smokeless tobacco: Never   Vaping Use    Vaping status: Never Used   Substance Use Topics    Alcohol use: Yes     Comment: 2 beers/day average    Drug use: Yes     Frequency: 7.0 times per week     Types: Marijuana     Comment: h/o cocaine abuse; daily marijuana          Physical Exam     ED Triage Vitals [02/23/23 0636]   Enc Vitals Group      BP 190/90      Heart Rate 87      SpO2 Pulse       Resp 18      Temp 36.8 ??C (98.3 ??F)      Temp Source Oral      SpO2 100 %      Weight 81.2 kg (179 lb)      Height 1.727 m (5' 8)       Constitutional: Alert and oriented. Well appearing and in no distress.  Eyes: Conjunctivae are normal.  ENT       Head: Normocephalic and atraumatic.       Nose: No congestion.       Mouth/Throat: Mucous membranes are moist.       Neck: No stridor.  Cardiovascular: Normal rate, regular rhythm. Normal and symmetric distal pulses are present in all extremities.  Respiratory: Normal respiratory effort. Breath sounds are normal.  Gastrointestinal: Soft and nontender. There is no CVA tenderness.  Musculoskeletal: Normal range of motion in all extremities. Right second toe amputated. Right great toe is red, warm, and with purple discoloration proximal to the toenail.          Right lower leg: No tenderness or edema.       Left lower leg: No tenderness or edema.  Neurologic: Normal speech and language. No gross focal neurologic deficits are appreciated. Decreased sensation to the bilateral plantar surfaces of the feet at baseline.  Skin: Skin is warm, dry and intact. No rash noted.  Psychiatric: Mood and affect are normal. Speech and behavior are normal.       Radiology     MRI Lower Extremity Joint Right W Wo Contrast   Final Result   1.  Acute on chronic intra-articular fracture deformity of the first proximal phalanx, involving the interphalangeal joint is favored. There is surrounding soft tissue swelling, but no peripheral abscess, draining sinus tract or peripheral wound/ulceration of the great toe. Overall, there is preserved T1 signal within the bone, and overall findings are unlikely to be related to osteomyelitis. Correlation with clinical/physical  exam and serological findings recommended. Consider follow-up diagnostic imaging in the short interval, if symptoms persist or worsen.    2.  Previously amputated second metatarsal. No evidence for acute osteomyelitis of the remnant second metatarsal.   3.  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 There appears to be callus formation suggesting subacute or chronic fracture deformity. Correlate with point tenderness and trauma history to the great toe. Surrounding soft tissue swelling.      Previous amputated second metatarsal with well-defined corticated distal margins.              Documentation assistance was provided by Nadene Rubins, Scribe on February 23, 2023 at 7:07 AM for Kevin Fenton, MD. February 23, 2023 12:07 PM. Documentation assistance provided by the above mentioned scribe. I was present during the time the encounter was recorded. The information recorded by the scribe was done at my direction and has been reviewed and validated by me.       Jearld Fenton, MD  02/23/23 [phone]

## 2023-02-23 NOTE — Unmapped (Signed)
Bed: 09-A  Expected date:   Expected time:   Means of arrival:   Comments:  transfer

## 2023-02-23 NOTE — Unmapped (Signed)
ED Clinical Impression     Final diagnoses:   Unspecified fracture of right toe(s), initial encounter for closed fracture (Primary)   Cellulitis of right toe          Progress Note     Received sign out from previous provider.    Patient Summary: Cory Bentley is a 62 y.o. male with history of HTN, HLD, T2DM w/ peripheral neuropathy, and GERD presenting as a transfer from [facility] ED with R great toe pain.  Patient started on broad-spectrum antibiotics at [facility] and transferred for evaluation by specialist at [facility].  Action List:   On reevaluation, patient resting comfortably in bed. Hemodynamically stable. The patient reports purple discoloration to his R great toe since yesterday. He struggles with diabetic neuropathy and is unsure of any recent toe injuries.  MRI showing acute on chronic fracture of right toe with soft tissue swelling but no abscess or osteomyelitis.  Patient with 2+ DP and PT pulses so no indication for vascular consult at this time.  Patient's foot is warm and well-perfused.  No indication for IV antibiotics given no evidence of osteomyelitis and patient without signs of sepsis.  We recommend treatment with a course of Keflex for suspected superficial cellulitis.  Patient will follow-up with his outpatient podiatrist next week for reevaluation.  Consulted orthopedic surgery for fracture who recommends postop shoe and outpatient follow-up as needed.    Updates       Documentation assistance was provided by Schuyler Amor, Scribe on February 23, 2023 at 12:55 PM for Eppie Gibson, MD.    February 23, 2023 3:04 PM. Documentation assistance provided by the above mentioned scribe. I was present during the time the encounter was recorded. The information recorded by the scribe was done at my direction and has been reviewed and validated by me.   Antionette Fairy, MD

## 2023-02-28 DIAGNOSIS — E1142 Type 2 diabetes mellitus with diabetic polyneuropathy: Secondary | ICD-10-CM | POA: Diagnosis not present

## 2023-02-28 DIAGNOSIS — M792 Neuralgia and neuritis, unspecified: Secondary | ICD-10-CM | POA: Diagnosis not present

## 2023-02-28 DIAGNOSIS — Z89421 Acquired absence of other right toe(s): Secondary | ICD-10-CM | POA: Diagnosis not present

## 2023-02-28 DIAGNOSIS — M79674 Pain in right toe(s): Secondary | ICD-10-CM | POA: Diagnosis not present

## 2023-02-28 DIAGNOSIS — S92411A Displaced fracture of proximal phalanx of right great toe, initial encounter for closed fracture: Secondary | ICD-10-CM | POA: Diagnosis not present

## 2023-03-03 ENCOUNTER — Ambulatory Visit: Admit: 2023-03-03 | Discharge: 2023-03-04 | Payer: MEDICARE

## 2023-03-03 DIAGNOSIS — E11628 Type 2 diabetes mellitus with other skin complications: Secondary | ICD-10-CM | POA: Diagnosis not present

## 2023-03-03 DIAGNOSIS — E1169 Type 2 diabetes mellitus with other specified complication: Secondary | ICD-10-CM | POA: Diagnosis not present

## 2023-03-03 DIAGNOSIS — E785 Hyperlipidemia, unspecified: Secondary | ICD-10-CM | POA: Diagnosis not present

## 2023-03-03 DIAGNOSIS — E1142 Type 2 diabetes mellitus with diabetic polyneuropathy: Secondary | ICD-10-CM | POA: Diagnosis not present

## 2023-03-03 DIAGNOSIS — Z794 Long term (current) use of insulin: Secondary | ICD-10-CM | POA: Diagnosis not present

## 2023-03-03 DIAGNOSIS — I10 Essential (primary) hypertension: Secondary | ICD-10-CM | POA: Diagnosis not present

## 2023-03-03 DIAGNOSIS — K219 Gastro-esophageal reflux disease without esophagitis: Secondary | ICD-10-CM | POA: Diagnosis not present

## 2023-03-03 DIAGNOSIS — Z23 Encounter for immunization: Secondary | ICD-10-CM | POA: Diagnosis not present

## 2023-03-03 LAB — HEMOGLOBIN A1C
ESTIMATED AVERAGE GLUCOSE: 151 mg/dL
HEMOGLOBIN A1C: 6.9 % — ABNORMAL HIGH (ref 4.8–5.6)

## 2023-03-03 NOTE — Unmapped (Unsigned)
Assessment and Plan:     Cory Bentley was seen today for hypertension and diabetes.    Diagnoses and all orders for this visit:    Type 2 diabetes mellitus with other skin complication, with long-term current use of insulin (CMS-HCC)  - Pt followed by endocrinology. Last appt 11/27/2022.   - Last HgbA1c 7.2 on 11/27/2022.   - Pt currently takes trulicity 1.5 mg subcutaneous weekly and jardiance 20 mg daily.   - Pt reports that he is not using his insulin aspart with meals or insulin detemir.   - Pt's average blood glucose over the past 30 days is 172.   - Endorses numbness/tingling in all extremities. Denies polyuria, polydipsia, vision changes, tremors.   - Plan to continue on current medication regimen and check HgbA1c today in clinic.   - Next endo appt 06/04/2023  -     Hemoglobin A1c    Diabetic polyneuropathy associated with type 2 diabetes mellitus (CMS-HCC)  - Pt followed by neurology for severe polyneuropathy. Last appt 02/12/2023.   - Per neurology note, plan was to wean patient off of cymbalta for ineffectiveness and have pt begin taking nortriptyline. When asked about this today, pt was very confused and stated he did not know what he was versus wasn't taking. Pt thinks that he started taking the nortriptyline, but never stopped taking the cymbalta. Pt was advised to STOP taking the cymbalta and continue taking the nortriptyline 20 mg nightly.   - Pt also is prescribed gabapentin 800 mg QID. However, pt reports taking gabapentin 800 mg BID.   - Plan to continue taking nortriptyline 20 mg nightly and gabapentin 800 mg BID.     Hyperlipidemia associated with type 2 diabetes mellitus (CMS-HCC)  - Last lipid panel 11/29/2022, tgs 87, cholesterol 116, HDL 36, LDL 63.   - Pt currently takes atorvastatin 40 mg daily. Denies medication SE.   - Plan to continue on current medication regimen.     Primary hypertension  - BP 142/72, which is slightly above goal. Pt states he already takes too many blood pressure pills.   - Pt currently takes lisinopril 40 mg daily and clonidine 0.1 mg BID.  - Pt followed by cardiology. Last appt 01/23/2023.  - Denies SHOB, CP, palpitations, HA, dizziness.   - Plan to continue on current medication regimen.       Gastroesophageal reflux disease, unspecified whether esophagitis present  - Pt currently takes omeprazole 20 mg daily.   - Pt states acid reflux is well-controlled.   - Denies N/V/D.   - Plan to continue on current medication regimen.     Other orders  -     INFLUENZA VACCINE IIV3(IM)(PF)6 [facility] UP         Return in about 3 months (around 06/03/2023) for Recheck, Diabetes.    Subjective:     HPI: Cory Bentley is a 62 y.o. male here for   Chief Complaint   Patient presents with    Hypertension    Diabetes   :    Type 2 diabetes mellitus with other skin complication, with long-term current use of insulin (CMS-HCC)  - Pt followed by endocrinology. Last appt 11/27/2022.   - Last HgbA1c 7.2 on 11/27/2022.   - Pt currently takes trulicity 1.5 mg subcutaneous weekly and jardiance 20 mg daily.   - Pt reports that he is not using his insulin aspart with meals or insulin detemir.   - Pt's average blood glucose over the past  30 days is 172.   - Endorses numbness/tingling in all extremities. Denies polyuria, polydipsia, vision changes, tremors.   - Plan to continue on current medication regimen and check HgbA1c today in clinic.   - Next endo appt 06/04/2023  -     Hemoglobin A1c    Diabetic polyneuropathy associated with type 2 diabetes mellitus (CMS-HCC)  - Pt followed by neurology for severe polyneuropathy. Last appt 02/12/2023.   - Per neurology note, plan was to wean patient off of cymbalta for ineffectiveness and have pt begin taking nortriptyline. When asked about this today, pt was very confused and stated he did not know what he was versus wasn't taking. Pt thinks that he started taking the nortriptyline, but never stopped taking the cymbalta. Pt was advised to STOP taking the cymbalta and continue taking the nortriptyline 20 mg nightly.   - Pt also is prescribed gabapentin 800 mg QID. However, pt reports taking gabapentin 800 mg BID.   - Plan to continue taking nortriptyline 20 mg nightly and gabapentin 800 mg BID.     Hyperlipidemia associated with type 2 diabetes mellitus (CMS-HCC)  - Last lipid panel 11/29/2022, tgs 87, cholesterol 116, HDL 36, LDL 63.   - Pt currently takes atorvastatin 40 mg daily. Denies medication SE.   - Plan to continue on current medication regimen.     Primary hypertension  - BP 142/72, which is slightly above goal. Pt states he already takes too many blood pressure pills.   - Pt currently takes lisinopril 40 mg daily and clonidine 0.1 mg BID.  - Pt followed by cardiology. Last appt 01/23/2023.  - Denies SHOB, CP, palpitations, HA, dizziness.   - Plan to continue on current medication regimen.       Gastroesophageal reflux disease, unspecified whether esophagitis present  - Pt currently takes omeprazole 20 mg daily.   - Pt states acid reflux is well-controlled.   - Denies N/V/D.   - Plan to continue on current medication regimen.     Other orders  -     INFLUENZA VACCINE IIV3(IM)(PF)6 MOS UP          ROS:   Review of Systems   Constitutional:  Negative for activity change, appetite change and fatigue.   Eyes:  Negative for photophobia and visual disturbance.   Respiratory:  Negative for cough, chest tightness and shortness of breath.    Cardiovascular:  Negative for chest pain, palpitations and leg swelling.   Gastrointestinal:  Negative for diarrhea, nausea and vomiting.   Endocrine: Negative for polydipsia and polyuria.   Neurological:  Positive for numbness (bilateral feet and hands). Negative for tremors and weakness.        Review of systems negative unless otherwise noted as per HPI    Objective:     Visit Vitals  BP 142/72   Pulse 72   Ht 172.7 cm (5' 8)   Wt 83.9 kg (185 lb)   SpO2 98%   BMI 28.13 kg/m??          03/03/23 1307   PainSc: 8 Physical Exam  Constitutional:       Appearance: Normal appearance.   HENT:      Head: Normocephalic and atraumatic.   Eyes:      General: Lids are normal.      Conjunctiva/sclera: Conjunctivae normal.   Cardiovascular:      Rate and Rhythm: Normal rate and regular rhythm.      Heart sounds: Normal heart sounds,  S1 normal and S2 normal. No murmur heard.  Pulmonary:      Effort: Pulmonary effort is normal. No tachypnea or accessory muscle usage.      Breath sounds: Normal breath sounds and air entry. No decreased breath sounds, wheezing or rales.   Abdominal:      General: Abdomen is flat. Bowel sounds are normal.      Palpations: Abdomen is soft.      Tenderness: There is no abdominal tenderness.   Neurological:      General: No focal deficit present.      Mental Status: He is alert and oriented to person, place, and time. Mental status is at baseline.      Motor: Motor function is intact.      Gait: Gait abnormal (walks with a cane).   Psychiatric:         Attention and Perception: Attention and perception normal.         Mood and Affect: Mood and affect normal.         Speech: Speech normal.         Behavior: Behavior normal. Behavior is cooperative.          [facility]:     Medication adherence and barriers to the treatment plan have been addressed. Opportunities to optimize healthy behaviors have been discussed. Patient / caregiver voiced understanding.

## 2023-03-03 NOTE — Unmapped (Signed)
Duloxetine 30mg  should be discontinued - Should not be on ANY Duloxetine at all   Should be on Nortriptyline 20mg  nightly

## 2023-03-05 DIAGNOSIS — E113312 Type 2 diabetes mellitus with moderate nonproliferative diabetic retinopathy with macular edema, left eye: Secondary | ICD-10-CM | POA: Diagnosis not present

## 2023-03-06 DIAGNOSIS — K219 Gastro-esophageal reflux disease without esophagitis: Principal | ICD-10-CM

## 2023-03-06 MED ORDER — OMEPRAZOLE 20 MG CAPSULE,DELAYED RELEASE
ORAL_CAPSULE | Freq: Every day | ORAL | 1 refills | 90 days | Status: CP
Start: 2023-03-06 — End: 2024-03-04

## 2023-03-06 NOTE — Unmapped (Addendum)
Patient is requesting the following refill  Requested Prescriptions      No prescriptions requested or ordered in this encounter       Recent Visits  Date Type Provider Dept   03/03/23 Office Visit Coralee North, Magnus Ivan, FNP [facility] [address] [facility]   11/29/22 Office Visit Clapp, Magnus Ivan, FNP [place] [facility] [address] At [facility]   07/24/22 Office Visit Clapp, Magnus Ivan, FNP [place] [facility] [address] At [facility]   04/24/22 Office Visit Clapp, Magnus Ivan, FNP [place] [facility] [address] At [facility]   03/13/22 Office Visit Clapp, Magnus Ivan, FNP [place] [facility] [address] At [facility]   Showing recent visits within past 365 days and meeting all other requirements  Future Appointments  Date Type Provider Dept   06/03/23 Appointment Coralee North, Magnus Ivan, FNP [place] [facility] [address] At [facility]   Showing future appointments within next 365 days and meeting all other requirements       Labs: Not applicable this refill

## 2023-03-06 NOTE — Unmapped (Signed)
Addended by: Wyonia Hough on: 03/06/2023 02:52 PM     Modules accepted: Orders

## 2023-03-10 DIAGNOSIS — Z794 Long term (current) use of insulin: Secondary | ICD-10-CM | POA: Diagnosis not present

## 2023-03-10 DIAGNOSIS — E1165 Type 2 diabetes mellitus with hyperglycemia: Secondary | ICD-10-CM | POA: Diagnosis not present

## 2023-03-11 NOTE — Unmapped (Signed)
Good morning Cory Bentley     I have reviewed your labs     Your A1c continues to improve - Keep up the good work     [employer]

## 2023-03-21 DIAGNOSIS — Z89421 Acquired absence of other right toe(s): Secondary | ICD-10-CM | POA: Diagnosis not present

## 2023-03-21 DIAGNOSIS — M79674 Pain in right toe(s): Secondary | ICD-10-CM | POA: Diagnosis not present

## 2023-03-21 DIAGNOSIS — M792 Neuralgia and neuritis, unspecified: Secondary | ICD-10-CM | POA: Diagnosis not present

## 2023-03-21 DIAGNOSIS — E1142 Type 2 diabetes mellitus with diabetic polyneuropathy: Secondary | ICD-10-CM | POA: Diagnosis not present

## 2023-03-21 DIAGNOSIS — S92411D Displaced fracture of proximal phalanx of right great toe, subsequent encounter for fracture with routine healing: Secondary | ICD-10-CM | POA: Diagnosis not present

## 2023-03-30 DIAGNOSIS — I10 Essential (primary) hypertension: Principal | ICD-10-CM

## 2023-03-30 MED ORDER — LISINOPRIL 40 MG TABLET
ORAL_TABLET | Freq: Every day | ORAL | 3 refills | 90.00 days | Status: CP
Start: 2023-03-30 — End: 2024-03-29

## 2023-04-07 DIAGNOSIS — E1142 Type 2 diabetes mellitus with diabetic polyneuropathy: Principal | ICD-10-CM

## 2023-04-07 MED ORDER — GABAPENTIN 800 MG TABLET
ORAL_TABLET | Freq: Three times a day (TID) | ORAL | 2 refills | 90.00 days | Status: CP
Start: 2023-04-07 — End: 2024-04-06

## 2023-04-09 DIAGNOSIS — E1165 Type 2 diabetes mellitus with hyperglycemia: Secondary | ICD-10-CM | POA: Diagnosis not present

## 2023-04-09 DIAGNOSIS — Z794 Long term (current) use of insulin: Secondary | ICD-10-CM | POA: Diagnosis not present

## 2023-04-14 DIAGNOSIS — E11628 Type 2 diabetes mellitus with other skin complications: Principal | ICD-10-CM

## 2023-04-14 DIAGNOSIS — Z794 Long term (current) use of insulin: Principal | ICD-10-CM

## 2023-04-14 MED ORDER — EMPAGLIFLOZIN 10 MG TABLET
ORAL_TABLET | Freq: Every day | ORAL | 2 refills | 90.00 days | Status: CP
Start: 2023-04-14 — End: 2024-04-12

## 2023-04-14 NOTE — Unmapped (Signed)
Patient is requesting the following refill  Requested Prescriptions     Pending Prescriptions Disp Refills    JARDIANCE 10 mg tablet [Pharmacy Med Name: JARDIANCE 10MG  TABLETS] 90 tablet 0     Sig: TAKE 2 TABLETS(20 MG) BY MOUTH DAILY       Recent Visits  Date Type Provider Dept   03/03/23 Office Visit Amie Portland, FNP [place] [facility] [address] At [facility]   11/29/22 Office Visit Clapp, Magnus Ivan, FNP [place] [facility] [address] At [facility]   07/24/22 Office Visit Clapp, Magnus Ivan, FNP [place] [facility] [address] [facility]   04/24/22 Office Visit Clapp, Magnus Ivan, FNP [place] [facility] [address] [facility]   Showing recent visits within past 365 days and meeting all other requirements  Future Appointments  Date Type Provider Dept   06/03/23 Appointment Amie Portland, FNP [place] [facility] [address] [facility]   Showing future appointments within next 365 days and meeting all other requirements         Labs: A1c:   Hemoglobin A1C (%)   Date Value   03/03/2023 6.9 (H)   03/02/2021 7.1 (A)   03/27/2019 14.2 (H)    Creatinine:   Creatinine (mg/dL)   Date Value   40/34/7425 1.11   04/13/2020 1.0

## 2023-04-30 NOTE — Unmapped (Signed)
[facility] Refill Coordination Note    Specialty Medication(s) to be Shipped:   Inflammatory Disorders: Stelara    Other medication(s) to be shipped: No additional medications requested for fill at this time     Cory Bentley, DOB: 20-Oct-1960  Phone: There are no phone numbers on file.      All above HIPAA information was verified with patient.     Was a Nurse, learning disability used for this call? No    Completed refill call assessment today to schedule patient's medication shipment from the [facility] [phone]).  All relevant notes have been reviewed.     Specialty medication(s) and dose(s) confirmed: Regimen is correct and unchanged.   Changes to medications: Cory Bentley reports no changes at this time.  Changes to insurance: No  New side effects reported not previously addressed with a pharmacist or physician: None reported  Questions for the pharmacist: No    Confirmed patient received a Conservation officer, historic buildings and a Surveyor, mining with first shipment. The patient will receive a drug information handout for each medication shipped and additional FDA Medication Guides as required.       DISEASE/MEDICATION-SPECIFIC INFORMATION        For patients on injectable medications: Patient currently has 0 doses left.  Next injection is scheduled for 1/23.    SPECIALTY MEDICATION ADHERENCE     Medication Adherence    Specialty Medication: ustekinumab (STELARA) 45 mg/0.5 mL Syrg syringe              Were doses missed due to medication being on hold? No    STELARA 45 mg/0.5 mL   : 0 days of medicine on hand       REFERRAL TO PHARMACIST     Referral to the pharmacist: Not needed      [facility]     Shipping address confirmed in Epic.       Delivery Scheduled: Yes, Expected medication delivery date: 1/17.     Medication will be delivered via Same Day Courier to the prescription address in Epic WAM.    Cory Bentley   [facility] Specialty Technician

## 2023-05-02 MED FILL — STELARA 45 MG/0.5 ML SUBCUTANEOUS SYRINGE: 84 days supply | Qty: 0.5 | Fill #1

## 2023-06-03 ENCOUNTER — Ambulatory Visit: Admit: 2023-06-03 | Discharge: 2023-06-04 | Payer: MEDICARE

## 2023-06-03 DIAGNOSIS — I10 Essential (primary) hypertension: Principal | ICD-10-CM

## 2023-06-03 DIAGNOSIS — E11628 Type 2 diabetes mellitus with other skin complications: Principal | ICD-10-CM

## 2023-06-03 DIAGNOSIS — Z794 Long term (current) use of insulin: Principal | ICD-10-CM

## 2023-06-03 DIAGNOSIS — E875 Hyperkalemia: Principal | ICD-10-CM

## 2023-06-03 LAB — BASIC METABOLIC PANEL
ANION GAP: 8 mmol/L (ref 5–14)
BLOOD UREA NITROGEN: 36 mg/dL — ABNORMAL HIGH (ref 9–23)
BUN / CREAT RATIO: 31
CALCIUM: 10 mg/dL (ref 8.7–10.4)
CHLORIDE: 105 mmol/L (ref 98–107)
CO2: 29 mmol/L (ref 20.0–31.0)
CREATININE: 1.18 mg/dL (ref 0.73–1.18)
EGFR CKD-EPI (2021) MALE: 71 mL/min/{1.73_m2} (ref >=60)
GLUCOSE RANDOM: 101 mg/dL (ref 70–179)
POTASSIUM: 6.4 mmol/L (ref 3.4–4.8)
SODIUM: 142 mmol/L (ref 135–145)

## 2023-06-04 DIAGNOSIS — E875 Hyperkalemia: Principal | ICD-10-CM

## 2023-06-04 MED ORDER — HYDROCHLOROTHIAZIDE 25 MG TABLET
ORAL_TABLET | Freq: Every day | ORAL | 0 refills | 30.00 days | Status: CP
Start: 2023-06-04 — End: ?

## 2023-06-04 NOTE — Unmapped (Signed)
 Pt has been notified of critical potassium level   It is not willing to go to ED   He is willing to take hydrochlorothiazide daily   In further discussion, he was on hydrochlorothiazide daily but he ran out 2 months ago and did not pursue refill when his nephrologist passed away - This could be cause of elevation - He was on hydrochlorothiazide 12.5mg  daily   I reviewed with him that I will be sending in prescription for hydrochlorothiazide 25mg  daily and recheck labs in 10d at that time we will determine if he will continue at 25mg  daily or decrease to 12.5mg  daily   He is in agreement with this plan  I also provided him with a list of potassium rich foods to avoid   Reviewed ED red flags symptoms for hyperkalemia

## 2023-06-08 NOTE — Unmapped (Signed)
 Assessment and Plan:     Cory Bentley was seen today for diabetes.    Diagnoses and all orders for this visit:    Type 2 diabetes mellitus with other skin complication, with long-term current use of insulin (CMS-HCC)    Pt is followed by Endo with last appt 11/2022  Last A1c 6.9 02/2023  His libre average is 120-140 over the last month  He is currently on jardiance 20mg  daily and trulicity 1.5mg  weekly    Pt is currently not taking any insulin  He states that he has 2 gym memberships but is not using either of them currently   He is trying to be more active   He is trying to eat healthier   Will continue with current medication regimen     Diabetic polyneuropathy associated with type 2 diabetes mellitus (CMS-HCC)    Pt is followed by Neurology     Pt is currently on cymbalta 20mg  (should have been discontinued) and gabapentin 800mg  three times a day (should be 1600mg  BID)  Last Neurology note states that pt was to wean off of cymbalta - We had discussed this in Nov but he continues to take and also started the nortriptyline 20mg  nightly   He feels that he should be taking both   I encourage him to reach out to Neurology   I recommend that he wean off cymbalta and discuss tamper and laid out by Neurology previously - Discussed potential increase for gabapentin to dose Neurology has noted     Pt has numbness/tingling in all extremities that continues     Primary hypertension    Pt BP today is 138/70  This is borderline at goal   Pt denies CP, SHOB, DOE, palpitations, dizziness, HA's, vision changes   He is currently on clonidine 0.1mg  twice a day, lisinopril 40mg  daily and hydrochlorothiazide 25mg  daily   Will continue with this regimen     -     Basic Metabolic Panel; Future  -     Basic Metabolic Panel    Hyperkalemia  -     Basic Metabolic Panel; Future  -     Basic Metabolic Panel    Hyperlipidemia associated with type 2 diabetes mellitus (CMS-HCC)    He is currently on atorvastatin 40mg  daily   He denies any SE of statin therapy   He is not currently exercising regularly but is trying to eat healthier   Last lipid panel 11/2022  Trigly 87, Chol 116, HDL 36, LDL 63  Will continue with this regimen     Anemia, unspecified type    Pt denies SHOB, DOE, dizziness, palpitations   Last HGB 11.5 02/2023 which is stable but trending down over the last year   12/2022 Iron panel normal and ferritin normal   Will continue to monitor     Marijuana abuse    Pt is currently smoking marijuana daily   He is not interested in cessation discussion           I personally spent 30 minutes face-to-face and non-face-to-face in the care of this patient, which includes all pre, intra, and post visit time on the date of service.  All documented time was specific to the E/M visit and does not include any procedures that may have been performed.       Return in about 6 months (around 12/01/2023) for Recheck HTN .    Subjective:     HPI: Cory Bentley is a 63  y.o. male here for   Chief Complaint   Patient presents with    Diabetes   :    Cory Bentley was seen today for diabetes.    Diagnoses and all orders for this visit:    Type 2 diabetes mellitus with other skin complication, with long-term current use of insulin (CMS-HCC)    Pt is followed by Endo with last appt 11/2022  Last A1c 6.9 02/2023  His libre average is 120-140 over the last month  He is currently on jardiance 20mg  daily and trulicity 1.5mg  weekly    Pt is currently not taking any insulin  He states that he has 2 gym memberships but is not using either of them currently   He is trying to be more active   He is trying to eat healthier   Will continue with current medication regimen     Diabetic polyneuropathy associated with type 2 diabetes mellitus (CMS-HCC)    Pt is followed by Neurology     Pt is currently on cymbalta 20mg  (should have been discontinued) and gabapentin 800mg  three times a day (should be 1600mg  BID)  Last Neurology note states that pt was to wean off of cymbalta - We had discussed this in Nov but he continues to take and also started the nortriptyline 20mg  nightly   He feels that he should be taking both   I encourage him to reach out to Neurology   I recommend that he wean off cymbalta and discuss tamper and laid out by Neurology previously - Discussed potential increase for gabapentin to dose Neurology has noted     Pt has numbness/tingling in all extremities that continues     Primary hypertension    Pt BP today is 138/70  This is borderline at goal   Pt denies CP, SHOB, DOE, palpitations, dizziness, HA's, vision changes   He is currently on clonidine 0.1mg  twice a day, lisinopril 40mg  daily and hydrochlorothiazide 25mg  daily   Will continue with this regimen     -     Basic Metabolic Panel; Future  -     Basic Metabolic Panel    Hyperkalemia  -     Basic Metabolic Panel; Future  -     Basic Metabolic Panel    Hyperlipidemia associated with type 2 diabetes mellitus (CMS-HCC)    He is currently on atorvastatin 40mg  daily   He denies any SE of statin therapy   He is not currently exercising regularly but is trying to eat healthier   Last lipid panel 11/2022  Trigly 87, Chol 116, HDL 36, LDL 63  Will continue with this regimen     Anemia, unspecified type    Pt denies SHOB, DOE, dizziness, palpitations   Last HGB 11.5 02/2023 which is stable but trending down over the last year   12/2022 Iron panel normal and ferritin normal   Will continue to monitor     Marijuana abuse    Pt is currently smoking marijuana daily   He is not interested in cessation discussion           ROS:   Review of Systems   Constitutional:  Negative for activity change, appetite change, fatigue and unexpected weight change.   Respiratory:  Negative for chest tightness and shortness of breath.    Cardiovascular:  Negative for chest pain and palpitations.   Gastrointestinal:  Negative for abdominal pain, diarrhea, nausea and vomiting.   Musculoskeletal:  Positive for gait problem.  Neurological:  Negative for dizziness, light-headedness and headaches.   Psychiatric/Behavioral:  Negative for sleep disturbance. The patient is not nervous/anxious.         Review of systems negative unless otherwise noted as per HPI    Objective:     Visit Vitals  BP 138/70   Pulse 80   Temp 36.7 ??C (98 ??F) (Temporal)   Ht 172.7 cm (5' 8)   Wt 87.6 kg (193 lb 1.6 oz)   SpO2 97%   BMI 29.36 kg/m??     There were no vitals filed for this visit.     Physical Exam  Constitutional:       General: He is not in acute distress.     Appearance: Normal appearance. He is well-developed and well-groomed. He is not ill-appearing.   HENT:      Head: Normocephalic and atraumatic.      Mouth/Throat:      Lips: Pink.      Mouth: Mucous membranes are moist.   Eyes:      Conjunctiva/sclera: Conjunctivae normal.   Cardiovascular:      Rate and Rhythm: Normal rate and regular rhythm.      Heart sounds: Normal heart sounds, S1 normal and S2 normal.   Pulmonary:      Effort: Pulmonary effort is normal.      Breath sounds: Normal breath sounds and air entry.   Abdominal:      General: Bowel sounds are normal.      Palpations: Abdomen is soft.      Tenderness: There is no abdominal tenderness.   Skin:     General: Skin is warm and dry.   Neurological:      Mental Status: He is alert and oriented to person, place, and time.      Gait: Gait abnormal.   Psychiatric:         Mood and Affect: Mood normal.         Behavior: Behavior is cooperative.          [facility]:     Medication adherence and barriers to the treatment plan have been addressed. Opportunities to optimize healthy behaviors have been discussed. Patient / caregiver voiced understanding.

## 2023-06-13 ENCOUNTER — Other Ambulatory Visit: Admit: 2023-06-13 | Discharge: 2023-06-14 | Payer: MEDICARE

## 2023-06-13 DIAGNOSIS — E875 Hyperkalemia: Principal | ICD-10-CM

## 2023-06-13 LAB — BASIC METABOLIC PANEL
ANION GAP: 12 mmol/L (ref 5–14)
BLOOD UREA NITROGEN: 37 mg/dL — ABNORMAL HIGH (ref 9–23)
BUN / CREAT RATIO: 33
CALCIUM: 9.6 mg/dL (ref 8.7–10.4)
CHLORIDE: 104 mmol/L (ref 98–107)
CO2: 26 mmol/L (ref 20.0–31.0)
CREATININE: 1.13 mg/dL (ref 0.73–1.18)
EGFR CKD-EPI (2021) MALE: 74 mL/min/{1.73_m2} (ref >=60)
GLUCOSE RANDOM: 137 mg/dL (ref 70–179)
POTASSIUM: 6 mmol/L — ABNORMAL HIGH (ref 3.4–4.8)
SODIUM: 142 mmol/L (ref 135–145)

## 2023-06-19 DIAGNOSIS — E875 Hyperkalemia: Principal | ICD-10-CM

## 2023-06-19 MED ORDER — HYDROCHLOROTHIAZIDE 25 MG TABLET
ORAL_TABLET | Freq: Every day | ORAL | 0 refills | 30.00 days | Status: CP
Start: 2023-06-19 — End: ?

## 2023-06-19 NOTE — Unmapped (Signed)
 Good morning Cory Bentley     I have reviewed your labs     Your potassium remains elevated   Were you able to pick up the hydrochlorothiazide and start taking it daily?  If so, we need to have you take a more long term regimen of hydrochlorothiazide and recheck labs in 2 weeks   I will be sending in the prescription for hydrochlorothiazide and you will need to take it continuously for at least a one month period and possible on going to control the elevated potassium level   If you have any questions please let me know   Please call the office to schedule the lab only appt     [name]

## 2023-06-23 ENCOUNTER — Ambulatory Visit: Admit: 2023-06-23 | Discharge: 2023-06-24 | Payer: MEDICARE | Attending: Nephrology | Primary: Nephrology

## 2023-06-23 DIAGNOSIS — E1121 Type 2 diabetes mellitus with diabetic nephropathy: Principal | ICD-10-CM

## 2023-06-23 DIAGNOSIS — N182 Chronic kidney disease, stage 2 (mild): Principal | ICD-10-CM

## 2023-06-23 DIAGNOSIS — D649 Anemia, unspecified: Principal | ICD-10-CM

## 2023-06-23 DIAGNOSIS — I10 Essential (primary) hypertension: Principal | ICD-10-CM

## 2023-06-23 DIAGNOSIS — E875 Hyperkalemia: Principal | ICD-10-CM

## 2023-06-23 DIAGNOSIS — R809 Proteinuria, unspecified: Principal | ICD-10-CM

## 2023-06-23 LAB — RENAL FUNCTION PANEL
ALBUMIN: 4.3 g/dL (ref 3.4–5.0)
ANION GAP: 11 mmol/L (ref 5–14)
BLOOD UREA NITROGEN: 37 mg/dL — ABNORMAL HIGH (ref 9–23)
BUN / CREAT RATIO: 35
CALCIUM: 10.4 mg/dL (ref 8.7–10.4)
CHLORIDE: 104 mmol/L (ref 98–107)
CO2: 27 mmol/L (ref 20.0–31.0)
CREATININE: 1.06 mg/dL (ref 0.73–1.18)
EGFR CKD-EPI (2021) MALE: 80 mL/min/{1.73_m2} (ref >=60)
GLUCOSE RANDOM: 114 mg/dL (ref 70–179)
PHOSPHORUS: 3.9 mg/dL (ref 2.4–5.1)
POTASSIUM: 6.3 mmol/L (ref 3.4–4.8)
SODIUM: 142 mmol/L (ref 135–145)

## 2023-06-23 LAB — ALBUMIN / CREATININE URINE RATIO
ALBUMIN QUANT URINE: 23.4 mg/dL
ALBUMIN/CREATININE RATIO: 336.7 ug/mg — ABNORMAL HIGH (ref 0.0–30.0)
CREATININE, URINE: 69.5 mg/dL

## 2023-06-23 MED ORDER — CHLORTHALIDONE 25 MG TABLET
ORAL_TABLET | Freq: Every morning | ORAL | 3 refills | 100.00 days | Status: CP
Start: 2023-06-23 — End: 2024-07-27

## 2023-06-23 NOTE — Unmapped (Signed)
 Referring Provider: Amie Portland, FNP     PCP:  Amie Portland, FNP    06/23/2023    Chief Complaint: follow-up for proteinuria and CKD    Background:  [name] is a 63 y.o. male who is followed here for CKD. He was referred here for evaluation of proteinuria and first seen in 10/2020.     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 His diabetes was poorly controlled for many years, with a documented a1c on his chart of 14%. He notes that this was fairly typical for him during that time period and many times he would max out his glucometer. His glucose is much better controlled now.     FH is positive for a cousin who required kidney transplant x2. The cause of renal disease is unknown. FH is also positive for DM2.      Was in ED 07/25/2021 for cellulitis to his right great toe. His SBP was noted to be 200 and his lisinopril was increased to 40mg  and he was started on nifedipine 30mg . He is not taking the nifedipine at this time as it made him feel strange with some parathesias to his neck. He was also placed on hydrochlorothiazide 12.5mg .      Had a Holter monitor placed 11/2022 for syncope.     HPI: Cory Bentley returns today for follow-up. He last saw Cory Bentley in 12/2022, I am meeting him for the first time today.     Recently struggling with hyperkalemia, he attributes this to being off his hydrochlorothiazide which his PCP recently restarted. K was 6.4 on 2/18, declined to go to ED, repeat on 2/28 was 6.0    He is asking today if he can come off of any medications. He is asking about clonidine. Reports he takes all of his medications once a day except gabapentin.     Feels that he doesn't drink enough water. States he drinks a lot of coffee. Occasionally drinks a bottle of water. 8 oz can of soda once a day. Drinks orange juice when his sugar drops.     Denies NSAID use.       PAST MEDICAL HISTORY:  Past Medical History:   Diagnosis Date    Cellulitis of left foot 07/24/2021    Diabetes mellitus (CMS-HCC)     Gastroparesis due to DM (CMS-HCC) 09/09/2019    Hypertension     Psoriasis     Substance abuse (CMS-HCC)        ALLERGIES  Patient has no known allergies.    SOCIAL HISTORY  Social History     Socioeconomic History    Marital status: Single   Tobacco Use    Smoking status: Never     Passive exposure: Never    Smokeless tobacco: Never   Vaping Use    Vaping status: Never Used   Substance and Sexual Activity    Alcohol use: Yes     Comment: 2 beers/day average    Drug use: Yes     Frequency: 7.0 times per week     Types: Marijuana     Comment: h/o cocaine abuse; daily marijuana    Sexual activity: Not Currently   Other Topics Concern    Do you use sunscreen? Yes    Tanning bed use? No  Are you easily burned? No    Excessive sun exposure? Yes    Blistering sunburns? No   Social History Narrative    Hx of cocaine abuse.  Denies tobacco.     Social Drivers of Psychologist, prison and probation services Strain: Low Risk  (07/24/2022)    Overall Financial Resource Strain (CARDIA)     Difficulty of Paying Living Expenses: Not hard at all   Food Insecurity: No Food Insecurity (06/03/2023)    Hunger Vital Sign     Worried About Running Out of Food in the Last Year: Never true     Ran Out of Food in the Last Year: Never true   Transportation Needs: No Transportation Needs (06/03/2023)    PRAPARE - Therapist, art (Medical): No     Lack of Transportation (Non-Medical): No   Physical Activity: Insufficiently Active (11/30/2020)    Exercise Vital Sign     Days of Exercise per Week: 3 days     Minutes of Exercise per Session: 40 min   Stress: No Stress Concern Present (11/30/2020)    [name] of Occupational Health - Occupational Stress Questionnaire     Feeling of Stress : Only a little   Social Connections: Unknown (11/30/2020)    Social Connection and Isolation Panel [NHANES]     Frequency of Communication with Friends and Family: More than three times a week     Frequency of Social Gatherings with Friends and Family: More than three times a week     Attends Religious Services: Patient declined     Database administrator or Organizations: Patient declined     Attends Banker Meetings: Patient declined     Marital Status: Divorced   Housing: Low Risk  (06/03/2023)    Housing     Within the past 12 months, have you ever stayed: outside, in a car, in a tent, in an overnight shelter, or temporarily in someone else's home (i.e. couch-surfing)?: No     Are you worried about losing your housing?: No         FAMILY HISTORY  Family History   Problem Relation Age of Onset    Diabetes Mother     Diabetes type II Mother     Ovarian cancer Mother     Diabetes Father     Diabetes type II Father     Diabetes type II Sister     Diabetes type II Brother     Diabetes type II Maternal Grandmother     Diabetes type II Maternal Grandfather     Diabetes type II Paternal Grandmother     Diabetes type II Paternal Grandfather     Melanoma Neg Hx     Basal cell carcinoma Neg Hx     Squamous cell carcinoma Neg Hx         MEDICATIONS:  Current Outpatient Medications   Medication Sig Dispense Refill    alcohol swabs (ALCOHOL PADS) PadM Apply 1 swab topically four (4) times a day. 400 each 1    atorvastatin (LIPITOR) 40 MG tablet Take 1 tablet (40 mg total) by mouth daily. 90 tablet 2    betamethasone valerate (VALISONE) 0.1 % ointment APPLY TO THICK AREAS OF PSORIASIS TWICE DAILY AS NEEDED UNTIL SMOOTH AVOID FACE AND SKIN FOLDS 45 g 8    blood sugar diagnostic Strp Accu Check Aviva Strip, use 4 times per day for monitoring insulin dependant uncontrolled  diabetes      blood-glucose meter kit Use as instructed 1 each 0    cloNIDine HCL (CATAPRES) 0.1 MG tablet Take 1 tablet (0.1 mg total) by mouth two (2) times a day. (Patient taking differently: Take 1 tablet (0.1 mg total) by mouth two (2) times a day. Take 1 tablet by mouth once a day)      diaper,brief,adult,disposable Misc 1 each by Miscellaneous route two (2) times a day. 20 each 2    dulaglutide (TRULICITY) 1.5 mg/0.5 mL PnIj Inject 0.5 mL (1.5 mg total) under the skin every seven (7) days.      DULoxetine (CYMBALTA) 20 MG capsule       empty container Misc Use as directed to dispose of Stelara syringes. 1 each 2    flash glucose sensor (FREESTYLE LIBRE 14 DAY SENSOR) kit Apply new sensor every 14 days and use as directed to monitor and check blood sugars 2 each 12    fluocinolone acetonide oil 0.01 % Drop Apply 2 -4 drops to ears twice a day 20 mL 4    gabapentin (NEURONTIN) 800 MG tablet Take 1 tablet (800 mg total) by mouth Three (3) times a day. 270 tablet 2    hydroCHLOROthiazide (HYDRODIURIL) 25 MG tablet Take 1 tablet (25 mg total) by mouth daily. 30 tablet 0    hydrocortisone 2.5 % ointment APPLY TO PSORIASIS IN EARS UP TO TWICE DAILY AS NEEDED UNTIL SMOOTH 28.35 g 0    insulin aspart (NOVOLOG FLEXPEN U-100 INSULIN) 100 unit/mL (3 mL) injection pen Inject 0.1 mL (10 Units total) under the skin Three (3) times a day before meals. 27 mL 3    insulin detemir U-100 (LEVEMIR FLEXPEN) 100 unit/mL (3 mL) injection pen Inject 0.52 mL (52 Units total) under the skin daily. 45 mL 0    lisinopril (PRINIVIL,ZESTRIL) 40 MG tablet Take 1 tablet (40 mg total) by mouth daily. 90 tablet 3    nortriptyline (PAMELOR) 10 MG capsule Take 1 capsule (10 mg total) by mouth nightly.      omeprazole (PRILOSEC) 20 MG capsule Take 1 capsule (20 mg total) by mouth daily. 90 capsule 1    testosterone 20.25 mg/1.25 gram (1.62 %) gel pump PLACE 2 SPRAYS ON THE SKIN DAILY 3.65 g 3    ustekinumab (STELARA) 45 mg/0.5 mL Syrg syringe Inject the contents of 1 syringe (45 mg total) under the skin every 12 weeks. 2 mL 3    empagliflozin (JARDIANCE) 10 mg tablet Take 2 tablets (20 mg total) by mouth daily. 180 tablet 2 No current facility-administered medications for this visit.       PHYSICAL EXAM:     Vitals:    06/23/23 1121   BP: 137/67   Pulse: 79   Temp: 36 ??C (96.8 ??F)     CONSTITUTIONAL: Alert, well appearing, no distress  HEENT: Moist mucous membranes, OP clear. Extra ocular movements intact. Sclerae anicteric.  CARDIOVASCULAR: RRR  PULM: Clear to auscultation bilaterally  EXTREMITIES: Trace lower extremity edema bilaterally, L>R (reports L always greater)  NEUROLOGIC: No gross focal motor deficits  PSYCH: alert and oriented x 3    MEDICAL DECISION MAKING    Results for orders placed or performed in visit on 06/13/23   Basic Metabolic Panel   Result Value Ref Range    Sodium 142 135 - 145 mmol/L    Potassium 6.0 (H) 3.4 - 4.8 mmol/L    Chloride 104 98 - 107 mmol/L  CO2 26.0 20.0 - 31.0 mmol/L    Anion Gap 12 5 - 14 mmol/L    BUN 37 (H) 9 - 23 mg/dL    Creatinine 1.61 0.96 - 1.18 mg/dL    BUN/Creatinine Ratio 33     eGFR CKD-EPI (2021) Male 74 >=60 mL/min/1.31m2    Glucose 137 70 - 179 mg/dL    Calcium 9.6 8.7 - 04.5 mg/dL        Creatinine   Date Value Ref Range Status   06/13/2023 1.13 0.73 - 1.18 mg/dL Final   40/98/1191 4.78 0.73 - 1.18 mg/dL Final   29/56/2130 8.65 0.73 - 1.18 mg/dL Final   78/46/9629 5.28 0.73 - 1.18 mg/dL Final   41/32/4401 0.27 (H) 0.73 - 1.18 mg/dL Final   25/36/6440 1.0 0.5 - 1.4 mg/dL Final   34/74/2595 0.9 0.5 - 1.4 mg/dL Final   63/87/5643 0.8 0.5 - 1.4 mg/dL Final   32/95/1884 1.1 0.5 - 1.4 mg/dL Final   16/60/6301 1.0 0.5 - 1.4 mg/dL Final        No results for input(s): NA, K, CL, BUN, CREATININE, GFR, GLU in the last 24 hours.    Invalid input(s): C02    IMAGING STUDIES:     ASSESSMENT/PLAN:    Mr.Cory Bentley is a 63 y.o. patient with a past medical history significant for DM, HTN, and CKD with albuminuria who is being seen in clinic.     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 Lab Results   Component Value Date    CREATININE 1.13 06/13/2023   - Cr stable over the last 6 months, though elevated compared to 2022-2023.   - BUN/Cr ratio quite elevated and he reports poor fluid intake, discussed drinking more water/fluids. He is also on thiazide and SGLT2i, which could be contributing, but I'm reluctant to stop his thiazide given his hyperkalemia - would need to stop his lisinopril as well (may need to decrease or stop if potassium remains problematic).   - The patient was counseled on avoiding nephrotoxic agents including NSAIDs   - On atorvastatin 40mg  daily. LDL goal <70 given DM, CKD + albuminuria. Most Recent LDL: 63  LDL date: 11/29/2022 At goal.   - UACR 139.107mcg/mg in 11/2022, recheck today. ACEi/ARB - on lisinopril 40mg  daily. Has been limited by hyperkalemia. SGLT2i - on Jardiance 10mg  daily.     Hyperkalemia: consistently hyperkalemic since 11/2022, has had hyperkalemia in the past (2021, 2023). Markedly elevated to 6.4 recently on 2/18 with PCP. Pt was not willing to go to ED, started hydrochlorothiazide.   Lab Results   Component Value Date    K 6.0 (H) 06/13/2023   - Hydrochlorothiazide restarted by PCP - switch to chlorthalidone 25mg  daily as this is more potent  - Discussed limiting dietary K and given printed handout today, reviewed with him.   - Recheck today, decrease or stop lisinopril if still >5.5  - Consider potassium binder in the future if needed, though he is reluctant to take more medications.     Proteinuria: subnephrotic. Peak UPC ~1.5 in 07/2021, UACR 706 in 08/2021, most recently improved. 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   - UACR 139.53mcg/mg in 11/2022, recheck today.     Anemia:   Lab Results   Component Value Date    HGB 11.5 (L) 02/23/2023     Lab Results  Component Value Date    IRON 72 12/20/2022    TIBC 274 12/20/2022    FERRITIN 52.1 12/20/2022   - Iron studies: Tsat 26%, ferritin 52.1. Takes a multivitamin, unsure if it has iron in it, asked him to check. States he hasn't had colonoscopy but got the information for this last week and says he's planning to set this up. Not significantly anemic. Recheck iron studies with next labs.     HTN: Goal <130/80.   BP Readings from Last 3 Encounters:   06/23/23 137/67   06/03/23 138/70   03/03/23 142/72   - Current meds: lisinopril 40mg  daily, hydrochlorothiazide 25mg  daily, clonidine 0.1mg  BID (but only taking once a day).   - On nifedipine in the past - stopped due to hypotension/parasthesias (unclear). Also appears he was previously on metoprolol, unclear when this was stopped/why.   - Stop clonidine - not a great choice for BP and he is only taking once a day so worry about rebound. Do not need to taper as he is only taking 0.1mg  once a day.   - Change hydrochlorothiazide to chlorthalidone 25mg  daily (more potent)    DM: followed by endocrinology (Dr. Tedd [facility] at [facility]). Complicated by retinopathy, neuropathy, nephropathy.   Lab Results   Component Value Date    A1C 6.9 (H) 03/03/2023   - Check urine microalbumin today  - Current medications: Jardiance 10mg  daily, Trulicity 1.5mg  weekly. Off insulin.   - H/o intolerance to metformin (diarrhea).     Mr.Cory Bentley will follow up in 4 months  The patient will need renal function panel, UACR, CBC, iron panel, ferritin within 4 weeks prior to next visit.      I personally spent 55 minutes face-to-face and non-face-to-face in the care of this patient, which includes all pre, intra, and post visit time on the date of service.

## 2023-06-23 NOTE — Unmapped (Signed)
 Colonoscopy  Procedure #1     Procedure #2   [id number]  MRN   GENERIC  Endoscopist     Is the patient's health insurance [employer], [employer], [place] [facility] [employer] [place], [employer], or [place]?     Urgent procedure     Are you pregnant?     Are you in the process of scheduling or awaiting results of a heart ultrasound, stress test, or catheterization to evaluate new or worsening chest pain, dizziness, or shortness of breath?     Do you take: Plavix (clopidogrel), Coumadin (warfarin), Lovenox (enoxaparin), Pradaxa (dabigatran), Effient (prasugrel), Xarelto (rivaroxaban), Eliquis (apixaban), Pletal (cilostazol), or Brilinta (ticagrelor)?          Did ordering provider indicate how long to hold this medication in the order comments?   #REF!       Which of the above medications are you taking?          What is the name of the medical practice that manages this medication?          What is the name of the medical provider who manages this medication?     Do you have hemophilia, von Willebrand disease, or low platelets?     Do you have a pacemaker or implanted cardiac defibrillator?     Has a [place] [facility] provider specified the location(s)?     Which location(s) did the [facility] provider specify?          [facility]          [place]          [facility]     Do you see a liver specialist for chronic liver disease?     Is the procedure indication for variceal screening?     Is procedure indication for variceal banding (this does NOT include variceal screening)?     Have you had a heart attack, stroke or heart stent placement within the past 6 months?     Month of event     Year of event (ONLY ENTER LAST 2 DIGITS)        5  Height (feet)   8  Height (inches)   189  Weight (pounds)   28.7  BMI          Did the ordering provider specify a bowel prep?          What bowel prep was specified?     Do you have an ostomy (bag on your stomach that collects your stool)?          Is it an ileostomy?          Is it a colostomy?          Patient doesn't know.     Do you have chronic kidney disease?     Do you have chronic constipation or have you had poor quality bowel preps for past colonoscopies?     Do you have Crohn's disease or ulcerative colitis?     Have you had weight loss surgery?          When you walk around your house or grocery store, do you have to stop and rest due to shortness of breath, chest pain, or light-headedness?     Do you ever use supplemental oxygen?     Have you been hospitalized for cirrhosis of the liver or heart failure in the last 12 months?     Have you been treated for mouth or throat  cancer with radiation or surgery?     Have you been told that it is difficult for doctors to insert a breathing tube in you during anesthesia?     Have you had a heart or lung transplant?          Are you on dialysis?     Do you have cirrhosis of the liver?     Do you have myasthenia gravis?     Is the patient a prisoner?   ################# ## ###################################################################################################################   MRN:  [id number]   Anticoag Review  No   Nurse Triage  No   GI clinic consult  No   Procedure(s):  Colonoscopy     0   Endoscopist:  GENERIC   Urgent:  No   Prep:  Nulytely Prep                  --------------------------- --- ----------------------------------------------------------------------------------------------------------------------------------------------------------------------------   [facility] Locations:  [facility]     [facility]     [place]        Requested Locations:              ################# ## ###################################################################################################################

## 2023-06-23 NOTE — Unmapped (Addendum)
 Stop taking your hydrochlorothiazide and take chlorthalidone in place of this.     Stop taking the clonidine.

## 2023-06-24 ENCOUNTER — Emergency Department
Admission: EM | Admit: 2023-06-24 | Discharge: 2023-06-24 | Disposition: A | Discharge status: Home / Self Care | Attending: Emergency Medicine | Admitting: Emergency Medicine

## 2023-06-24 ENCOUNTER — Other Ambulatory Visit: Payer: Self-pay

## 2023-06-24 DIAGNOSIS — N189 Chronic kidney disease, unspecified: Secondary | ICD-10-CM | POA: Diagnosis not present

## 2023-06-24 DIAGNOSIS — E1122 Type 2 diabetes mellitus with diabetic chronic kidney disease: Secondary | ICD-10-CM | POA: Insufficient documentation

## 2023-06-24 DIAGNOSIS — Z8639 Personal history of other endocrine, nutritional and metabolic disease: Secondary | ICD-10-CM

## 2023-06-24 DIAGNOSIS — E875 Hyperkalemia: Secondary | ICD-10-CM | POA: Diagnosis present

## 2023-06-24 HISTORY — DX: Essential (primary) hypertension: I10

## 2023-06-24 HISTORY — DX: Chronic kidney disease, stage 2 (mild): N18.2

## 2023-06-24 LAB — CBC WITH DIFFERENTIAL/PLATELET
Abs Immature Granulocytes: 0.03 10*3/uL (ref 0.00–0.07)
Basophils Absolute: 0.1 10*3/uL (ref 0.0–0.1)
Basophils Relative: 1 %
Eosinophils Absolute: 0.8 10*3/uL — ABNORMAL HIGH (ref 0.0–0.5)
Eosinophils Relative: 10 %
HCT: 38.1 % — ABNORMAL LOW (ref 39.0–52.0)
Hemoglobin: 12 g/dL — ABNORMAL LOW (ref 13.0–17.0)
Immature Granulocytes: 0 %
Lymphocytes Relative: 21 %
Lymphs Abs: 1.8 10*3/uL (ref 0.7–4.0)
MCH: 30.3 pg (ref 26.0–34.0)
MCHC: 31.5 g/dL (ref 30.0–36.0)
MCV: 96.2 fL (ref 80.0–100.0)
Monocytes Absolute: 0.7 10*3/uL (ref 0.1–1.0)
Monocytes Relative: 9 %
Neutro Abs: 4.9 10*3/uL (ref 1.7–7.7)
Neutrophils Relative %: 59 %
Platelets: 150 10*3/uL (ref 150–400)
RBC: 3.96 MIL/uL — ABNORMAL LOW (ref 4.22–5.81)
RDW: 14 % (ref 11.5–15.5)
WBC: 8.3 10*3/uL (ref 4.0–10.5)
nRBC: 0 % (ref 0.0–0.2)

## 2023-06-24 LAB — COMPREHENSIVE METABOLIC PANEL
ALT: 37 U/L (ref 0–44)
AST: 30 U/L (ref 15–41)
Albumin: 4 g/dL (ref 3.5–5.0)
Alkaline Phosphatase: 63 U/L (ref 38–126)
Anion gap: 8 (ref 5–15)
BUN: 43 mg/dL — ABNORMAL HIGH (ref 6–20)
CO2: 23 mmol/L (ref 22–32)
Calcium: 9.3 mg/dL (ref 8.9–10.3)
Chloride: 106 mmol/L (ref 98–111)
Creatinine, Ser: 1.25 mg/dL — ABNORMAL HIGH (ref 0.61–1.24)
GFR, Estimated: >60 mL/min (ref >60)
Glucose, Bld: 169 mg/dL — ABNORMAL HIGH (ref 70–99)
Potassium: 4.8 mmol/L (ref 3.5–5.1)
Sodium: 137 mmol/L (ref 135–145)
Total Bilirubin: 0.7 mg/dL (ref 0.0–1.2)
Total Protein: 7.4 g/dL (ref 6.5–8.1)

## 2023-06-24 MED ORDER — PEG 3350-ELECTROLYTES 236 GRAM-22.74 GRAM-6.74 GRAM-5.86 GRAM SOLUTION
0 refills | 0.00 days | Status: CP
Start: 2023-06-24 — End: ?

## 2023-06-24 NOTE — ED Triage Notes (Addendum)
 Patient states he saw his Nephrologist yesterday for routine labs and was sent over for potassium 6.3.

## 2023-06-24 NOTE — Discharge Instructions (Addendum)
 Please follow-up with Rachel Moulds, MD regarding further trending of your potassium levels

## 2023-06-24 NOTE — ED Provider Notes (Signed)
 [facility] Provider Note   Event Date/Time   First MD Initiated Contact with Patient 06/24/23 1018     (approximate) History  Weakness  HPI Christian Flores is a 63 y.o. male with a past medical history of type 2 diabetes and chronic renal disease who presents after being told by his nephrologist that his potassium was dangerously high at 6.3.  Patient denies any complaints at this time ROS: Patient currently denies any vision changes, tinnitus, difficulty speaking, facial droop, sore throat, chest pain, shortness of breath, abdominal pain, nausea/vomiting/diarrhea, dysuria, or weakness/numbness/paresthesias in any extremity   Physical Exam  Triage Vital Signs: ED Triage Vitals  Encounter Vitals Group     BP 06/24/23 0943 (!) 172/88     Systolic BP Percentile --      Diastolic BP Percentile --      Pulse Rate 06/24/23 0943 86     Resp 06/24/23 0943 20     Temp 06/24/23 0943 98.1 F (36.7 C)     Temp Source 06/24/23 0943 Oral     SpO2 06/24/23 0943 100 %     Weight 06/24/23 0942 189 lb (85.7 kg)     Height 06/24/23 0942 5\' 8"  (1.727 m)     Head Circumference --      Peak Flow --      Pain Score 06/24/23 0942 0     Pain Loc --      Pain Education --      Exclude from Growth Chart --    Most recent vital signs: Vitals:   06/24/23 0943  BP: (!) 172/88  Pulse: 86  Resp: 20  Temp: 98.1 F (36.7 C)  SpO2: 100%   General: Awake, oriented x4. CV:  Good peripheral perfusion.  Resp:  Normal effort.  Abd:  No distention.  Other:  Middle-aged overweight male resting comfortably in no acute distress ED Results / Procedures / Treatments  Labs (all labs ordered are listed, but only abnormal results are displayed) Labs Reviewed  CBC WITH DIFFERENTIAL/PLATELET - Abnormal; Notable for the following components:      Result Value   RBC 3.96 (*)    Hemoglobin 12.0 (*)    HCT 38.1 (*)    Eosinophils Absolute 0.8 (*)    All other components within normal  limits  COMPREHENSIVE METABOLIC PANEL - Abnormal; Notable for the following components:   Glucose, Bld 169 (*)    BUN 43 (*)    Creatinine, Ser 1.25 (*)    All other components within normal limits   EKG ED ECG REPORT I, Merwyn Katos, the attending physician, personally viewed and interpreted this ECG. Date: 06/24/2023 EKG Time: 0946 Rate: 89 Rhythm: normal sinus rhythm QRS Axis: normal Intervals: normal ST/T Wave abnormalities: normal Narrative Interpretation: no evidence of acute ischemia.  No evidence of peaked T waves or other signs of hyperkalemia PROCEDURES: Critical Care performed: No Procedures MEDICATIONS ORDERED IN ED: Medications - No data to display IMPRESSION / MDM / ASSESSMENT AND PLAN / ED COURSE  I reviewed the triage vital signs and the nursing notes.                             The patient is on the cardiac monitor to evaluate for evidence of arrhythmia and/or significant heart rate changes. Patient's presentation is most consistent with acute presentation with potential threat to life or bodily function. Patient is a 63 year old  male with the above-stated past medical history presents at the request of his nephrologist due to hyperkalemia that was seen on lab work done yesterday.  Patient does show potassium of 6.3 however on repeat CMP performed today, his potassium was 4.8.  Patient shows no evidence of peaked T waves on EKG.  Patient denies any other complaints at this time.  Patient is stable for discharge with follow-up with his nephrologist as soon as possible to continue following his potassium  Dispo: Discharge home with nephrology follow-up   FINAL CLINICAL IMPRESSION(S) / ED DIAGNOSES   Final diagnoses:  History of hyperkalemia  Chronic kidney disease, unspecified CKD stage  History of type 2 diabetes mellitus   Rx / DC Orders   ED Discharge Orders     None      Note:  This document was prepared using Dragon voice recognition software and  may include unintentional dictation errors.   Merwyn Katos, MD 06/24/23 1102

## 2023-06-24 NOTE — ED Notes (Signed)
See triage note.   Patient in NAD

## 2023-07-02 NOTE — Unmapped (Signed)
 Good afternoon Cory Bentley     I have reviewed your labs and once again your potassium level is significantly elevated   We have tried the conventional ways of lowering from home and as I recommended with prior lab, you need to go to the ED for more aggressive measures to lower the potassium level   I encourage you to go to the ED for further eval     [name]

## 2023-07-04 NOTE — Unmapped (Signed)
 Please call pt with lab results note   Thanks   [name]

## 2023-07-07 NOTE — Unmapped (Signed)
 Cory Bentley did not come to his appointment today.

## 2023-07-07 NOTE — Unmapped (Unsigned)
 Referring Provider: Amie Portland, FNP     PCP:  Amie Portland, FNP    07/07/2023    Chief Complaint: follow-up for proteinuria and CKD    Background:  [name] is a 63 y.o. male who is followed here for CKD. He was referred here for evaluation of proteinuria and first seen in 10/2020.     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 His diabetes was poorly controlled for many years, with a documented a1c on his chart of 14%. He notes that this was fairly typical for him during that time period and many times he would max out his glucometer. His glucose is much better controlled now.     FH is positive for a cousin who required kidney transplant x2. The cause of renal disease is unknown. FH is also positive for DM2.      Was in ED 07/25/2021 for cellulitis to his right great toe. His SBP was noted to be 200 and his lisinopril was increased to 40mg  and he was started on nifedipine 30mg . He is not taking the nifedipine at this time as it made him feel strange with some parathesias to his neck. He was also placed on hydrochlorothiazide 12.5mg .      Had a Holter monitor placed 11/2022 for syncope.     HPI: Cory Bentley returns today for follow-up.     After his last visit I called him as his potassium came back at 6.3, advised him to go to ED and to hold his lisinopril. He returned to clinic the next day and reported that his repeat K in ED was 4.9 so he was discharged home. Advised to continue holding lisinopril given that he has had multiple high potassium levels on recent labs. On review of ED notes, appears that he presented the following day and K was 4.8, EKG without concerning changes.                     PAST MEDICAL HISTORY:  Past Medical History:   Diagnosis Date    Cellulitis of left foot 07/24/2021 Diabetes mellitus (CMS-HCC)     Gastroparesis due to DM (CMS-HCC) 09/09/2019    Hypertension     Psoriasis     Substance abuse (CMS-HCC)        ALLERGIES  Patient has no known allergies.    SOCIAL HISTORY  Social History     Socioeconomic History    Marital status: Single   Tobacco Use    Smoking status: Never     Passive exposure: Never    Smokeless tobacco: Never   Vaping Use    Vaping status: Never Used   Substance and Sexual Activity    Alcohol use: Yes     Comment: 2 beers/day average    Drug use: Yes     Frequency: 7.0 times per week     Types: Marijuana     Comment: h/o cocaine abuse; daily marijuana    Sexual activity: Not Currently   Other Topics Concern    Do you use sunscreen? Yes    Tanning bed use? No    Are you easily burned? No    Excessive sun exposure? Yes    Blistering sunburns? No   Social History Narrative    Hx of cocaine  abuse.  Denies tobacco.     Social Drivers of Health     Financial Resource Strain: Low Risk  (07/24/2022)    Overall Financial Resource Strain (CARDIA)     Difficulty of Paying Living Expenses: Not hard at all   Food Insecurity: No Food Insecurity (06/03/2023)    Hunger Vital Sign     Worried About Running Out of Food in the Last Year: Never true     Ran Out of Food in the Last Year: Never true   Transportation Needs: No Transportation Needs (06/03/2023)    PRAPARE - Therapist, art (Medical): No     Lack of Transportation (Non-Medical): No   Physical Activity: Insufficiently Active (11/30/2020)    Exercise Vital Sign     Days of Exercise per Week: 3 days     Minutes of Exercise per Session: 40 min   Stress: No Stress Concern Present (11/30/2020)    Cory Bentley [employer] Occupational [employer] - Occupational Stress Questionnaire     Feeling of Stress : Only a little   Social Connections: Unknown (11/30/2020)    Social Connection and Isolation Panel [NHANES]     Frequency of Communication with Friends and Family: More than three times a week     Frequency of Social Gatherings with Friends and Family: More than three times a week     Attends Religious Services: Patient declined     Database administrator or Organizations: Patient declined     Attends Banker Meetings: Patient declined     Marital Status: Divorced   Housing: Low Risk  (06/03/2023)    Housing     Within the past 12 months, have you ever stayed: outside, in a car, in a tent, in an overnight shelter, or temporarily in someone else's home (i.e. couch-surfing)?: No     Are you worried about losing your housing?: No         FAMILY HISTORY  Family History   Problem Relation Age of Onset    Diabetes Mother     Diabetes type II Mother     Ovarian cancer Mother     Diabetes Father     Diabetes type II Father     Diabetes type II Sister     Diabetes type II Brother     Diabetes type II Maternal Grandmother     Diabetes type II Maternal Grandfather     Diabetes type II Paternal Grandmother     Diabetes type II Paternal Grandfather     Melanoma Neg Hx     Basal cell carcinoma Neg Hx     Squamous cell carcinoma Neg Hx         MEDICATIONS:  Current Outpatient Medications   Medication Sig Dispense Refill    alcohol swabs (ALCOHOL PADS) PadM Apply 1 swab topically four (4) times a day. 400 each 1    atorvastatin (LIPITOR) 40 MG tablet Take 1 tablet (40 mg total) by mouth daily. 90 tablet 2    betamethasone valerate (VALISONE) 0.1 % ointment APPLY TO THICK AREAS OF PSORIASIS TWICE DAILY AS NEEDED UNTIL SMOOTH AVOID FACE AND SKIN FOLDS 45 g 8    blood sugar diagnostic Strp Accu Check Aviva Strip, use 4 times per day for monitoring insulin dependant uncontrolled diabetes      blood-glucose meter kit Use as instructed 1 each 0    chlorthalidone (HYGROTON) 25 MG tablet Take 1 tablet (25 mg total)  by mouth every morning. 100 tablet 3    diaper,brief,adult,disposable Misc 1 each by Miscellaneous route two (2) times a day. 20 each 2    dulaglutide (TRULICITY) 1.5 mg/0.5 mL PnIj Inject 0.5 mL (1.5 mg total) under the skin every seven (7) days.      DULoxetine (CYMBALTA) 20 MG capsule       empagliflozin (JARDIANCE) 10 mg tablet Take 2 tablets (20 mg total) by mouth daily. 180 tablet 2    empty container Misc Use as directed to dispose of Stelara syringes. 1 each 2    flash glucose sensor (FREESTYLE LIBRE 14 DAY SENSOR) kit Apply new sensor every 14 days and use as directed to monitor and check blood sugars 2 each 12    fluocinolone acetonide oil 0.01 % Drop Apply 2 -4 drops to ears twice a day 20 mL 4    gabapentin (NEURONTIN) 800 MG tablet Take 1 tablet (800 mg total) by mouth Three (3) times a day. 270 tablet 2    hydrocortisone 2.5 % ointment APPLY TO PSORIASIS IN EARS UP TO TWICE DAILY AS NEEDED UNTIL SMOOTH 28.35 g 0    insulin aspart (NOVOLOG FLEXPEN U-100 INSULIN) 100 unit/mL (3 mL) injection pen Inject 0.1 mL (10 Units total) under the skin Three (3) times a day before meals. 27 mL 3    insulin detemir U-100 (LEVEMIR FLEXPEN) 100 unit/mL (3 mL) injection pen Inject 0.52 mL (52 Units total) under the skin daily. 45 mL 0    lisinopril (PRINIVIL,ZESTRIL) 40 MG tablet Take 1 tablet (40 mg total) by mouth daily. 90 tablet 3    nortriptyline (PAMELOR) 10 MG capsule Take 1 capsule (10 mg total) by mouth nightly.      omeprazole (PRILOSEC) 20 MG capsule Take 1 capsule (20 mg total) by mouth daily. 90 capsule 1    polyethylene glycol (GOLYTELY) 236-22.74-6.74 gram solution Take by mouth as directed per [facility] GI prep instructions, for split bowel prep. 4000 mL 0    testosterone 20.25 mg/1.25 gram (1.62 %) gel pump PLACE 2 SPRAYS ON THE SKIN DAILY 3.65 g 3    ustekinumab (STELARA) 45 mg/0.5 mL Syrg syringe Inject the contents of 1 syringe (45 mg total) under the skin every 12 weeks. 2 mL 3     No current facility-administered medications for this visit.       PHYSICAL EXAM:     There were no vitals filed for this visit.    CONSTITUTIONAL: Alert, well appearing, no distress  HEENT: Moist mucous membranes, OP clear. Extra ocular movements intact. Sclerae anicteric.  CARDIOVASCULAR: RRR  PULM: Clear to auscultation bilaterally  EXTREMITIES: Trace lower extremity edema bilaterally, L>R (reports L always greater)  NEUROLOGIC: No gross focal motor deficits  PSYCH: alert and oriented x 3    MEDICAL DECISION MAKING    Results for orders placed or performed in visit on 06/23/23   Microalbumin / creatinine urine ratio   Result Value Ref Range    Creat U 69.5 Undefined mg/dL    Albumin Quantitative, Urine 23.4 Undefined mg/dL    Albumin/Creatinine Ratio 336.7 (H) 0.0 - 30.0 ug/mg   Renal Function Panel   Result Value Ref Range    Sodium 142 135 - 145 mmol/L    Potassium 6.3 (HH) 3.4 - 4.8 mmol/L    Chloride 104 98 - 107 mmol/L    CO2 27.0 20.0 - 31.0 mmol/L    Anion Gap 11 5 - 14 mmol/L  BUN 37 (H) 9 - 23 mg/dL    Creatinine 4.09 8.11 - 1.18 mg/dL    BUN/Creatinine Ratio 35     eGFR CKD-EPI (2021) Male 80 >=60 mL/min/1.79m2    Glucose 114 70 - 179 mg/dL    Calcium 91.4 8.7 - 78.2 mg/dL    Phosphorus 3.9 2.4 - 5.1 mg/dL    Albumin 4.3 3.4 - 5.0 g/dL        Creatinine   Date Value Ref Range Status   06/23/2023 1.06 0.73 - 1.18 mg/dL Final   95/62/1308 6.57 0.73 - 1.18 mg/dL Final   84/69/6295 2.84 0.73 - 1.18 mg/dL Final   13/24/4010 2.72 0.73 - 1.18 mg/dL Final   53/66/4403 4.74 0.73 - 1.18 mg/dL Final   25/95/6387 1.0 0.5 - 1.4 mg/dL Final   56/43/3295 0.9 0.5 - 1.4 mg/dL Final   18/84/1660 0.8 0.5 - 1.4 mg/dL Final   63/04/6008 1.1 0.5 - 1.4 mg/dL Final   93/23/5573 1.0 0.5 - 1.4 mg/dL Final        No results for input(s): NA, K, CL, BUN, CREATININE, GFR, GLU in the last 24 hours.    Invalid input(s): C02    IMAGING STUDIES:     ASSESSMENT/PLAN:    Mr.Cory Bentley is a 63 y.o. patient with a past medical history significant for DM, HTN, and CKD with albuminuria who is being seen in clinic.     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   Lab Results   Component Value Date    CREATININE 1.06 06/23/2023   - Cr stable over the last 6 months, though elevated compared to 2022-2023.   - BUN/Cr ratio quite elevated and he reports poor fluid intake, discussed drinking more water/fluids. He is also on thiazide and SGLT2i, which could be contributing, but I'm reluctant to stop his thiazide given his hyperkalemia - would need to stop his lisinopril as well (may need to decrease or stop if potassium remains problematic).   - The patient was counseled on avoiding nephrotoxic agents including NSAIDs   - On atorvastatin 40mg  daily. LDL goal <70 given DM, CKD + albuminuria. Most Recent LDL: 63  LDL date: 11/29/2022 At goal.   - UACR 336.33mcg/mg in 06/2023 (while on lisinopril). ACEi/ARB - lisinopril stopped due to hyperkalemia. SGLT2i - on Jardiance 10mg  daily.     Hyperkalemia: consistently hyperkalemic since 11/2022, has had hyperkalemia in the past (2021, 2023). Markedly elevated to 6.4 recently on 2/18 with PCP, started on thiazide at that time.   Lab Results   Component Value Date    K 6.3 (HH) 06/23/2023   - Repeat K 4.8 on 3/11 in ED.   - On chlorthalidone 25mg  daily  - Discussed limiting dietary K and given printed handout at last visit, reviewed with him.   - Recheck today, decrease or stop lisinopril if still >5.5  - Consider potassium binder in the future if needed, though he is reluctant to take more medications.     Anemia:   Lab Results   Component Value Date    HGB 11.5 (L) 02/23/2023     Lab Results   Component Value Date    IRON 72 12/20/2022    TIBC 274 12/20/2022    FERRITIN 52.1 12/20/2022   - Iron studies: Tsat 26%, ferritin 52.1. Takes a multivitamin, unsure if it has iron in it, asked him to check. States he  hasn't had colonoscopy but got the information for this last week and says he's planning to set this up. Not significantly anemic. Recheck iron studies with next labs.     HTN: Goal <130/80.   BP Readings from Last 3 Encounters:   06/23/23 137/67   06/03/23 138/70   03/03/23 142/72   - Current meds: chlorthalidone 25mg  daily  - On nifedipine in the past - stopped due to hypotension/parasthesias (unclear). He was seen in 01/2023 by neurology for numbness/tingling/weakness/balance issues, and I see no mention of calcium channel blocker or concern about this. Cardiology note from 01/2023 indicates that he was on nifedipine at that time and to continue.   - Also appears he was previously on metoprolol, unclear when this was stopped/why (appears he was on at the time of his cardiology visit in 01/2023).   - Lisinopril 40mg  daily stopped on 3/10 due to hyperkalemia - would not restart unless potassium lower and would start low dose with very close monitoring if so  - Stopped clonidine at last visit as he was only taking once a day, not a great choice for BP and once a day dosing is not ideal for BP.   - Restart nifedipine ***    DM: followed by endocrinology (Dr. Tedd Sias at [facility] Complicated by retinopathy, neuropathy, nephropathy.   Lab Results   Component Value Date    A1C 6.9 (H) 03/03/2023   - UACR 336.63mcg/mg - macroalbuminuria present.   - Current medications: Jardiance 10mg  daily, Trulicity 1.5mg  weekly. Off insulin.   - H/o intolerance to metformin (diarrhea).     Mr.Cory Bentley will follow up in 4 months  The patient will need renal function panel, UACR, CBC, iron panel, ferritin within 4 weeks prior to next visit.

## 2023-07-08 ENCOUNTER — Ambulatory Visit: Admit: 2023-07-08 | Discharge: 2023-07-09 | Payer: MEDICARE

## 2023-07-08 DIAGNOSIS — Z79899 Other long term (current) drug therapy: Principal | ICD-10-CM

## 2023-07-08 DIAGNOSIS — L409 Psoriasis, unspecified: Principal | ICD-10-CM

## 2023-07-08 DIAGNOSIS — M720 Palmar fascial fibromatosis [Dupuytren]: Principal | ICD-10-CM

## 2023-07-08 MED ORDER — BETAMETHASONE VALERATE 0.1 % TOPICAL OINTMENT
8 refills | 0 days | Status: CP
Start: 2023-07-08 — End: ?

## 2023-07-08 MED ORDER — FLUOCINOLONE ACETONIDE OIL 0.01 % EAR DROPS
4 refills | 0 days | Status: CP
Start: 2023-07-08 — End: ?

## 2023-07-08 MED ORDER — USTEKINUMAB 45 MG/0.5 ML SUBCUTANEOUS SYRINGE
3 refills | 0 days | Status: CP
Start: 2023-07-08 — End: ?
  Filled 2023-07-18: qty 0.5, 84d supply, fill #0

## 2023-07-08 NOTE — Unmapped (Signed)
 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 Does not require refill today.  - Tolerating Stelara well  - Continue betamethasone valerate (VALISONE) 0.1 % ointment; Apply to thick areas of psoriasis twice daily as needed until smooth. Avoid face and skin folds. Does not require refill today.  - Continue fluocinolone acetonide oil 0.01 % Drop; Apply 2 -4 drops to ears twice a day  Dispense: 20 mL; Refill: 4  - The proper use and side effects of topical steroids were discussed with the patient.  These include skin pigmentary changes, skin atrophy, telangiectasias,  and striae.  The patient knows to discontinue the steroids if any of these side effects occur.     High risk medication use, Stelara  - Last quant gold 01/07/23    Dupuytren's contractures  - Referral to orthopedic surgery    The patient was advised to call for an appointment should any new, changing, or symptomatic lesions develop.     RTC: Return in about 6 months (around 01/08/2024). or sooner as needed   _________________________________________________________________      Chief Complaint     Chief Complaint   Patient presents with    Psoriasis     Follow up on psoriasis       HPI     Cory Bentley is a 63 y.o. male who presents as a returning patient (last seen 01/07/2023) to Dermatology for follow up of psoriasis.     At last visit, continued stelara and betamethasone and fluocinolone oil.    Today:  - overall tolerating Stelara well, only rarely using topicals  - notes joint pain in hands    The patient denies any other new or changing lesions or areas of concern.     Pertinent Past Medical History     Problem List       Psoriasis     Past Medical History, Family History, Social History, Medication List, Allergies, and Problem List were reviewed in the rooming section of Epic.     ROS: Other than symptoms mentioned in the HPI, no fevers, chills, or other skin complaints    Physical Examination     GENERAL: Well-appearing male in no acute distress, resting comfortably.  NEURO: Alert and oriented, answers questions appropriately  PSYCH: Normal mood and affect  RESP: No increased work of breathing  SKIN (Waist Up Skin Exam): Per patient request, exam of the scalp, face, eyelids, lips, nose, ears, neck, chest, upper abdomen, back, arms, hands, and fingernails was performed, including also bilateral lower extremities  - Well circumscribed erythematous papules and plaques with overlying silvery scale on elbows, knees     All areas not commented on are within normal limits or unremarkable      (Approved Template 12/27/2019)

## 2023-07-08 NOTE — Unmapped (Signed)
 Meet your team:     Your nurse is: Coralee North    Please remember to fill out the survey you will receive after your visit. Your comments help [place] continue to improve our care.      Thanks in advance!      [facility] Clinical Staff

## 2023-07-10 DIAGNOSIS — E875 Hyperkalemia: Principal | ICD-10-CM

## 2023-07-10 MED ORDER — HYDROCHLOROTHIAZIDE 25 MG TABLET
ORAL_TABLET | Freq: Every day | ORAL | 0 refills | 30 days
Start: 2023-07-10 — End: ?

## 2023-07-10 NOTE — Unmapped (Signed)
 Patients pharmacy is requesting the following refill  Requested Prescriptions     Pending Prescriptions Disp Refills    hydroCHLOROthiazide (HYDRODIURIL) 25 MG tablet 30 tablet 0     Sig: Take 1 tablet (25 mg total) by mouth daily.       Recent Visits  Date Type Provider Dept   06/03/23 Office Visit Coralee North, Magnus Ivan, FNP [facility] [address] [facility]   03/03/23 Office Visit Coralee North, Magnus Ivan, FNP [place] [place] [facility] [address] [facility]   11/29/22 Office Visit Clapp, Magnus Ivan, FNP [place] [facility] [address] [facility]   07/24/22 Office Visit Clapp, Magnus Ivan, FNP [facility] [facility] [address] [facility]   Showing recent visits within past 365 days and meeting all other requirements  Future Appointments  Date Type Provider Dept   12/02/23 Appointment Coralee North, Magnus Ivan, FNP [place] [facility] [address] [facility]   Showing future appointments within next 365 days and meeting all other requirements       Labs: Not applicable this refill

## 2023-07-11 NOTE — Unmapped (Signed)
 [facility] Refill Coordination Note    Specialty Medication(s) to be Shipped:   Inflammatory Disorders: Stelara    Other medication(s) to be shipped: No additional medications requested for fill at this time     Kyden Potash, DOB: 1961-02-24  Phone: There are no phone numbers on file.      All above HIPAA information was verified with patient.     Was a Nurse, learning disability used for this call? No    Completed refill call assessment today to schedule patient's medication shipment from the [facility] [phone]).  All relevant notes have been reviewed.     Specialty medication(s) and dose(s) confirmed: Regimen is correct and unchanged.   Changes to medications: Indie reports no changes at this time.  Changes to insurance: No  New side effects reported not previously addressed with a pharmacist or physician: None reported  Questions for the pharmacist: No    Confirmed patient received a Conservation officer, historic buildings and a Surveyor, mining with first shipment. The patient will receive a drug information handout for each medication shipped and additional FDA Medication Guides as required.       DISEASE/MEDICATION-SPECIFIC INFORMATION        For patients on injectable medications: Patient currently has 0 doses left.  Next injection is scheduled for 4/17.    SPECIALTY MEDICATION ADHERENCE     Medication Adherence    Patient reported X missed doses in the last month: 0  Specialty Medication: ustekinumab (STELARA) 45 mg/0.5 mL Syrg syringe  Patient is on additional specialty medications: No  Informant: patient              Were doses missed due to medication being on hold? No    STELARA 45 mg/0.5 mL   : 0 days of medicine on hand       REFERRAL TO PHARMACIST     Referral to the pharmacist: Not needed      [facility]       Shipping address confirmed in Epic.     Cost and Payment: Patient has a $0 copay, payment information is not required.    Delivery Scheduled: Yes, Expected medication delivery date: 4/4.     Medication will be delivered via Same Day Courier to the prescription address in Epic WAM.    [facility]

## 2023-07-15 ENCOUNTER — Ambulatory Visit: Admit: 2023-07-15 | Discharge: 2023-07-16 | Attending: Nephrology | Primary: Nephrology

## 2023-07-15 DIAGNOSIS — R809 Proteinuria, unspecified: Principal | ICD-10-CM

## 2023-07-15 DIAGNOSIS — I10 Essential (primary) hypertension: Principal | ICD-10-CM

## 2023-07-15 DIAGNOSIS — N182 Chronic kidney disease, stage 2 (mild): Principal | ICD-10-CM

## 2023-07-15 DIAGNOSIS — E1121 Type 2 diabetes mellitus with diabetic nephropathy: Principal | ICD-10-CM

## 2023-07-15 MED ORDER — NIFEDIPINE ER 30 MG TABLET,EXTENDED RELEASE 24 HR
ORAL_TABLET | Freq: Every day | ORAL | 3 refills | 100 days | Status: CP
Start: 2023-07-15 — End: 2024-08-18

## 2023-07-15 NOTE — Unmapped (Signed)
 Referring Provider: Gradie Lawless, FNP     PCP:  Gradie Lawless, FNP    07/15/2023    Chief Complaint: follow-up for proteinuria and CKD    Background:  [name] is a 63 y.o. male who is followed here for CKD. He was referred here for evaluation of proteinuria and first seen in 10/2020.     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 His diabetes was poorly controlled for many years, with a documented a1c on his chart of 14%. He notes that this was fairly typical for him during that time period and many times he would max out his glucometer. His glucose is much better controlled now.     FH is positive for a cousin who required kidney transplant x2. The cause of renal disease is unknown. FH is also positive for DM2.      Was in ED 07/25/2021 for cellulitis to his right great toe. His SBP was noted to be 200 and his lisinopril was increased to 40mg  and he was started on nifedipine 30mg . He is not taking the nifedipine at this time as it made him feel strange with some parathesias to his neck. He was also placed on hydrochlorothiazide 12.5mg .      Had a Holter monitor placed 11/2022 for syncope.     HPI: Cory Bentley returns today for follow-up.     After his last visit on 3/10 I called him as his potassium came back at 6.3, advised him to go to ED and to hold his lisinopril. He returned to clinic the next day and reported that his repeat K in ED was 4.9 so he was discharged home. Advised to continue holding lisinopril given that he has had multiple high potassium levels on recent labs. On review of ED notes, appears that he presented the following day and K was 4.8, EKG without concerning changes.     History of Present Illness  He presents with concerns about elevated potassium levels, which were recently measured at 6.3. After discontinuing lisinopril, his potassium level decreased to 4.8. He is worried about the rapid change in potassium levels and potential causes. He stopped taking lisinopril two days ago after running out of the medication; at the instruction from a nurse practitioner from his insurance company who did a home visit he resumed it due to elevated blood pressure.     He is currently taking chlorthalidone 25 mg daily for blood pressure management. He was previously on clonidine, which was discontinued due to side effects and the risk of rebound hypertension. He recalls being prescribed clonidine for nerve pain by a neurologist, but I discussed with him that on review of his notes I do not see any mention of it being prescribed for this indication. He is also on gabapentin, Cymbalta, and nortriptyline for neuropathy, with nortriptyline also aiding sleep. He uses marijuana for pain management, expressing concern about narcotic addiction.    He has a history of diabetes, which has led to kidney damage and proteinuria. His last A1c was 6.9, showing improvement from a previous level of 14. He exercises regularly, walking two miles a day, which he believes helps with diabetes control. He is aware of the importance of managing his diabetes to prevent further kidney damage.  PAST MEDICAL HISTORY:  Past Medical History:   Diagnosis Date    Cellulitis of left foot 07/24/2021    Diabetes mellitus     Gastroparesis due to DM 09/09/2019    Hypertension     Psoriasis     Substance abuse        ALLERGIES  Patient has no known allergies.    SOCIAL HISTORY  Social History     Socioeconomic History    Marital status: Single   Tobacco Use    Smoking status: Never     Passive exposure: Never    Smokeless tobacco: Never   Vaping Use    Vaping status: Never Used   Substance and Sexual Activity    Alcohol use: Yes     Comment: 2 beers/day average    Drug use: Yes     Frequency: 7.0 times per week     Types: Marijuana Comment: h/o cocaine abuse; daily marijuana    Sexual activity: Not Currently   Other Topics Concern    Do you use sunscreen? Yes    Tanning bed use? No    Are you easily burned? No    Excessive sun exposure? Yes    Blistering sunburns? No   Social History Narrative    Hx of cocaine abuse.  Denies tobacco.     Social Drivers of Psychologist, prison and probation services Strain: Low Risk  (07/24/2022)    Overall Financial Resource Strain (CARDIA)     Difficulty of Paying Living Expenses: Not hard at all   Food Insecurity: No Food Insecurity (06/03/2023)    Hunger Vital Sign     Worried About Running Out of Food in the Last Year: Never true     Ran Out of Food in the Last Year: Never true   Transportation Needs: No Transportation Needs (06/03/2023)    PRAPARE - Therapist, art (Medical): No     Lack of Transportation (Non-Medical): No   Physical Activity: Insufficiently Active (11/30/2020)    Exercise Vital Sign     Days of Exercise per Week: 3 days     Minutes of Exercise per Session: 40 min   Stress: No Stress Concern Present (11/30/2020)    [name] of Occupational Health - Occupational Stress Questionnaire     Feeling of Stress : Only a little   Social Connections: Unknown (11/30/2020)    Social Connection and Isolation Panel [NHANES]     Frequency of Communication with Friends and Family: More than three times a week     Frequency of Social Gatherings with Friends and Family: More than three times a week     Attends Religious Services: Patient declined     Database administrator or Organizations: Patient declined     Attends Banker Meetings: Patient declined     Marital Status: Divorced   Housing: Low Risk  (06/03/2023)    Housing     Within the past 12 months, have you ever stayed: outside, in a car, in a tent, in an overnight shelter, or temporarily in someone else's home (i.e. couch-surfing)?: No     Are you worried about losing your housing?: No         FAMILY HISTORY  Family History   Problem Relation Age of Onset    Diabetes Mother     Diabetes type II Mother     Ovarian cancer Mother     Diabetes Father  Diabetes type II Father     Diabetes type II Sister     Diabetes type II Brother     Diabetes type II Maternal Grandmother     Diabetes type II Maternal Grandfather     Diabetes type II Paternal Grandmother     Diabetes type II Paternal Grandfather     Melanoma Neg Hx     Basal cell carcinoma Neg Hx     Squamous cell carcinoma Neg Hx         MEDICATIONS:  Current Outpatient Medications   Medication Sig Dispense Refill    alcohol swabs (ALCOHOL PADS) PadM Apply 1 swab topically four (4) times a day. 400 each 1    atorvastatin (LIPITOR) 40 MG tablet Take 1 tablet (40 mg total) by mouth daily. 90 tablet 2    betamethasone valerate (VALISONE) 0.1 % ointment Apply to thick areas of psoriasis twice daily as needed until smooth. Avoid face and skin folds. 45 g 8    blood sugar diagnostic Strp Accu Check Aviva Strip, use 4 times per day for monitoring insulin dependant uncontrolled diabetes      blood-glucose meter kit Use as instructed 1 each 0    chlorthalidone (HYGROTON) 25 MG tablet Take 1 tablet (25 mg total) by mouth every morning. 100 tablet 3    diaper,brief,adult,disposable Misc 1 each by Miscellaneous route two (2) times a day. 20 each 2    dulaglutide (TRULICITY) 1.5 mg/0.5 mL PnIj Inject 0.5 mL (1.5 mg total) under the skin every seven (7) days.      DULoxetine (CYMBALTA) 20 MG capsule       empagliflozin (JARDIANCE) 10 mg tablet Take 2 tablets (20 mg total) by mouth daily. 180 tablet 2    empty container Misc Use as directed to dispose of Stelara syringes. 1 each 2    flash glucose sensor (FREESTYLE LIBRE 14 DAY SENSOR) kit Apply new sensor every 14 days and use as directed to monitor and check blood sugars 2 each 12    fluocinolone acetonide oil 0.01 % Drop Apply 2 -4 drops to ears twice a day 20 mL 4    gabapentin (NEURONTIN) 800 MG tablet Take 1 tablet (800 mg total) by mouth Three (3) times a day. (Patient taking differently: Take 1 tablet (800 mg total) by mouth Four (4) times a day with a meal and nightly.) 270 tablet 2    hydrocortisone 2.5 % ointment APPLY TO PSORIASIS IN EARS UP TO TWICE DAILY AS NEEDED UNTIL SMOOTH 28.35 g 0    insulin aspart (NOVOLOG FLEXPEN U-100 INSULIN) 100 unit/mL (3 mL) injection pen Inject 0.1 mL (10 Units total) under the skin Three (3) times a day before meals. 27 mL 3    lisinopril (PRINIVIL,ZESTRIL) 40 MG tablet Take 1 tablet (40 mg total) by mouth daily. 90 tablet 3    nortriptyline (PAMELOR) 10 MG capsule Take 1 capsule (10 mg total) by mouth nightly.      omeprazole (PRILOSEC) 20 MG capsule Take 1 capsule (20 mg total) by mouth daily. 90 capsule 1    polyethylene glycol (GOLYTELY) 236-22.74-6.74 gram solution Take by mouth as directed per [facility] GI prep instructions, for split bowel prep. 4000 mL 0    testosterone 20.25 mg/1.25 gram (1.62 %) gel pump PLACE 2 SPRAYS ON THE SKIN DAILY 3.65 g 3    ustekinumab (STELARA) 45 mg/0.5 mL Syrg syringe Inject the contents of 1 syringe (45 mg total) under the skin every 12  weeks. 2 mL 3    insulin detemir U-100 (LEVEMIR FLEXPEN) 100 unit/mL (3 mL) injection pen Inject 0.52 mL (52 Units total) under the skin daily. (Patient not taking: Reported on 07/15/2023) 45 mL 0     No current facility-administered medications for this visit.       PHYSICAL EXAM:     Vitals:    07/15/23 1032   BP: 131/85   Pulse: 77   Temp: 36.1 ??C (96.9 ??F)       CONSTITUTIONAL: Alert, well appearing, no distress  HEENT: Moist mucous membranes, OP clear. Extra ocular movements intact. Sclerae anicteric.  CARDIOVASCULAR: RRR  PULM: Clear to auscultation bilaterally  EXTREMITIES: Trace lower extremity edema bilaterally, L>R (reports L always greater)  NEUROLOGIC: No gross focal motor deficits  PSYCH: alert and oriented x 3    MEDICAL DECISION MAKING    Results for orders placed or performed in visit on 06/23/23   Microalbumin / creatinine urine ratio   Result Value Ref Range    Creat U 69.5 Undefined mg/dL    Albumin Quantitative, Urine 23.4 Undefined mg/dL    Albumin/Creatinine Ratio 336.7 (H) 0.0 - 30.0 ug/mg   Renal Function Panel   Result Value Ref Range    Sodium 142 135 - 145 mmol/L    Potassium 6.3 (HH) 3.4 - 4.8 mmol/L    Chloride 104 98 - 107 mmol/L    CO2 27.0 20.0 - 31.0 mmol/L    Anion Gap 11 5 - 14 mmol/L    BUN 37 (H) 9 - 23 mg/dL    Creatinine 1.61 0.96 - 1.18 mg/dL    BUN/Creatinine Ratio 35     eGFR CKD-EPI (2021) Male 80 >=60 mL/min/1.44m2    Glucose 114 70 - 179 mg/dL    Calcium 04.5 8.7 - 40.9 mg/dL    Phosphorus 3.9 2.4 - 5.1 mg/dL    Albumin 4.3 3.4 - 5.0 g/dL        Creatinine   Date Value Ref Range Status   06/23/2023 1.06 0.73 - 1.18 mg/dL Final   81/19/1478 2.95 0.73 - 1.18 mg/dL Final   62/13/0865 7.84 0.73 - 1.18 mg/dL Final   69/62/9528 4.13 0.73 - 1.18 mg/dL Final   24/40/1027 2.53 0.73 - 1.18 mg/dL Final   66/44/0347 1.0 0.5 - 1.4 mg/dL Final   42/59/5638 0.9 0.5 - 1.4 mg/dL Final   75/64/3329 0.8 0.5 - 1.4 mg/dL Final   51/88/4166 1.1 0.5 - 1.4 mg/dL Final   10/13/1599 1.0 0.5 - 1.4 mg/dL Final        No results for input(s): NA, K, CL, BUN, CREATININE, GFR, GLU in the last 24 hours.    Invalid input(s): C02    IMAGING STUDIES:     ASSESSMENT/PLAN:    Mr.Cory Bentley is a 63 y.o. patient with a past medical history significant for DM, HTN, and CKD with albuminuria who is being seen in clinic.     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   Lab Results   Component Value Date    CREATININE 1.06 06/23/2023   - Cr stable over the last 6 months, though elevated compared to 2022-2023  - The patient was counseled on avoiding nephrotoxic agents including NSAIDs   - On atorvastatin 40mg  daily. LDL goal <70 given DM, CKD + albuminuria. Most Recent LDL: 63  LDL date: 11/29/2022 At goal.   - UACR 336.83mcg/mg  in 06/2023 (while on lisinopril). ACEi/ARB - lisinopril stopped in 06/2023 due to hyperkalemia (severe, multiple episodes in spite of thiazide). Has since restarted but ran out 2 days ago and I advised him not to resume at this time. SGLT2i - on Jardiance 10mg  daily.     H/o hyperkalemia: was consistently hyperkalemic since 11/2022, has had hyperkalemia in the past (2021, 2023). Markedly elevated to 6.4 recently on 2/18 with PCP, started on thiazide at that time.   Lab Results   Component Value Date    K 6.3 (HH) 06/23/2023   - Repeat K 4.8 on 3/11 in ED. I stopped his lisinopril, but an NP from his insurance company instructed him to resume. He ran out 2 days ago, reinforced that he should stay off at this time.   - On chlorthalidone 25mg  daily  - Discussed limiting dietary K and given printed handout at last visit, reviewed with him.   - Consider potassium binder in the future to allow for ACEi/ARB use, though he is reluctant to take more medications.     Anemia:   Lab Results   Component Value Date    HGB 11.5 (L) 02/23/2023     Lab Results   Component Value Date    IRON 72 12/20/2022    TIBC 274 12/20/2022    FERRITIN 52.1 12/20/2022   - Iron studies: Tsat 26%, ferritin 52.1. Takes a multivitamin, unsure if it has iron in it, asked him to check. States he hasn't had colonoscopy but planning to set this up. Not significantly anemic. Recheck iron studies with next labs.     HTN: Goal <130/80.   BP Readings from Last 3 Encounters:   07/15/23 131/85   06/23/23 137/67   06/03/23 138/70   - Current meds: chlorthalidone 25mg  daily. Had restarted lisinopril on 3/21 at the recommendation of an NP that did a home visit (came from [employer]), ran out 2 days ago. Do not restart.   - On nifedipine in the past - stopped due to hypotension/parasthesias (unclear). He was seen in 01/2023 by neurology for numbness/tingling/weakness/balance issues, and I see no mention of calcium channel blocker or concern about this. Cardiology note from 01/2023 indicates that he was on nifedipine at that time and to continue.   - Also appears he was previously on metoprolol, unclear when this was stopped/why (appears he was on at the time of his cardiology visit in 01/2023).   - Lisinopril 40mg  daily stopped on 3/10 due to hyperkalemia - would not restart unless potassium lower and would start low dose with very close monitoring if so. Discussed today given his repeated severe hyperkalemia would only restart if starting concurrent potassium binder.   - Restart nifedipine 30mg  daily today    DM: followed by endocrinology (Dr. Lorelei [facility] at [facility]). Complicated by retinopathy, neuropathy, nephropathy.   Lab Results   Component Value Date    A1C 6.9 (H) 03/03/2023   - UACR 336.48mcg/mg - macroalbuminuria present.   - Current medications: Jardiance 10mg  daily, Trulicity 1.5mg  weekly. Off insulin.   - H/o intolerance to metformin (diarrhea).     Mr.Cory Bentley will follow up in 4 months or sooner as needed  The patient will need renal function panel, UACR, CBC, iron panel, ferritin, lipid panel within 4 weeks prior to next visit.      I personally spent 35 minutes face-to-face and non-face-to-face in the care of this patient, which includes all pre, intra, and post visit time on  the date of service.  All documented time was specific to the E/M visit and does not include any procedures that may have been performed.

## 2023-07-15 NOTE — Unmapped (Signed)
 DO NOT restart lisinopril   I have prescribed a medication called nifedipine to help with blood pressure control, this will not cause high potassium. You have taken this in the past and it does not appear that you have had any issues tolerating it.

## 2023-08-11 MED ORDER — DEXCOM G7 SENSOR DEVICE
3 refills | 0.00000 days | Status: CN
Start: 2023-08-11 — End: 2024-08-10

## 2023-08-11 NOTE — Unmapped (Signed)
 Addended by: Mitchel An on: 08/11/2023 11:36 AM     Modules accepted: Orders

## 2023-08-11 NOTE — Unmapped (Signed)
 Copied from CRM #[id number]. Topic: Access To Clinicians - Medication Question  >> Aug 11, 2023 10:00 AM Bambi Bonine wrote:  They need assistance with their medication(s). Approval to change brand to Dexcon 7 Sensor.     Medication Name(s): flash glucose sensor (FREESTYLE LIBRE 14 DAY SENSOR) kit [[id number]    Pharmacy: [id number] [place], [place] - [address] AT [facility] [address] [facility] [address]  [address] [place], [place] [address]  Phone: [phone]  Fax: [phone]       Please contact The patient by Cell Phone in regards to this request.    Coverage: yes, coverage is accurate on file.    Medication request callback turnaround time: 72 business hours. (Caller Notified)        Patient is wanting to see about switching sensors please assist.

## 2023-08-11 NOTE — Unmapped (Signed)
 Mychart message sent to patient.

## 2023-08-13 ENCOUNTER — Ambulatory Visit
Admit: 2023-08-13 | Discharge: 2023-08-14 | Payer: Medicare (Managed Care) | Attending: Plastic and Reconstructive Surgery | Primary: Plastic and Reconstructive Surgery

## 2023-08-13 NOTE — Unmapped (Signed)
 PRE-OPERATIVE INFORMATION       Filomena Huge, MD  [facility]  Clinical Support: Sherline Distel [phone]  (medical issues)  Surgery Scheduler: Gracie Lav [phone]  Financial Care Counselor: Wilene Hang [phone]  Estimate Line: [phone]        Surgery Date    If a surgery date is not provided to you today, our surgery scheduler will reach out to you within 1 week to provide a surgery date. If you have not been contacted within 1 week of your surgical consultation, please contact our scheduler: Gracie Lav at [phone].   If need to cancel your surgery for any reason please call the scheduling office asap to due so.        Surgical Time  Surgical times are given out from 2:00pm-5:00pm on the last business day before your scheduled surgery. A preoperative nurse will call you during this time to confirm when you should arrive for your surgery. If you have not been called by 5:00pm please contact [phone].      The Night Before Your Surgery     DO NOT EAT OR DRINK ANYTHING AFTER MIDNIGHT, unless instructed otherwise. If you are diabetic, check with your primary care physician for special instructions concerning your insulin dosage prior to surgery. Take your usual medications as prescribed, unless otherwise instructed.         The Morning of Your Surgery    You should wear loose fitting clothes that will not be restrictive to your surgical site. Please leave jewelry, credit cards, cash, and other valuables at home. Do not wear any piercings, nail polish, make-up, or metal hair clips the day before surgery. Contact lenses, glasses, and dentures must be removed before surgery. Please make sure to bring a case to store these items in during surgery.     You may take your usual medications as prescribed with a small sip of water, unless otherwise instructed. Do NOT drink a full glass of water.     A RESPONSIBLE ADULT (OVER 18) MUST ACCOMPANY YOU ON THE DAY OF SURGERY AND BE AVAILABLE THROUGHOUT YOUR PROCEDURE IN THE EVENT THERE ARE QUESTIONS OR COMPLICATION. THEY ALSO MUST BE AVAILABLE TO TAKE YOU HOME FOLLOWING YOUR PROCEDURE AS IT WILL NOT BE SAFE FOR YOU TO DRIVE OR TAKE PUBLIC TRANSPORTATION ALONE.     Medications    If you are instructed by Dr. Knoll to discontinue taking a medication, such as blood thinners, prior to surgery you will need to contact the prescribing provider of that medication to receive instructions on the safest way to do so.        FOLLOW-UP APPOINTMENTS    Your return appointment will be scheduled approximately 2-3 weeks after surgery, our scheduling office will call you to make a post operative follow up appointment after surgery.    If you need to reschedule this appointment or have questions as to the time of   your appointment please call.      OCCUPATIONAL THERAPY    You may have to attend Occupational Therapy following your surgery. A referral will be placed if it is necessary. If Occupational Therapy is determined to be necessary by your provider someone will be contacting you following your surgery to schedule. Many therapy appointments will be done same day after your first post-operative appointment with your provider.        IF YOU HAVE QUESTIONS or CONCERNS  During normal business hours, call Dr. Nalani Ave clinical support  staff member Sherline Distel at [phone] for medical issues, or Dr. Nalani Ave surgery scheduler, Christyne Crane, at [phone] for scheduling concerns.      During evenings/nights/weekends, call [facility] at [phone] and ask for the on-call orthopaedic surgery resident for urgent medical issues.

## 2023-08-13 NOTE — Unmapped (Signed)
 ORTHOPAEDIC NEW CLINIC NOTE     ASSESSMENT:  Cory Bentley is a 63 y.o. 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    PLAN:  - We discussed the various treatment options for his severe right SF contracture including digit widget and amputation depending on what the patient's goals are. We discussed that flexion contracture are incredibly difficult problems to fix. If the patient would like to retain some function of that finger we recommend trying the digit widget, however, this would require lifelong hand therapy to prevent recurrent finger flexion. Patient would like to proceed with the digit widget, MAC and local. Patient agreeable and consented.     PROCEDURES:  None    SUBJECTIVE:  Chief Complaint:  Right SF contracture.    History of Present Illness:   Cory Bentley is a 63 y.o. male who presents with right SF severe flexion contracture. He has a history of diabetes and had a severe right 4th webspace abscess s/p I&Dx3 in 04/2018. A skin graft was placed over the volar aspect of the MCPJ.       Medical History   Past Medical History:   Diagnosis Date    Cellulitis of left foot 07/24/2021    Diabetes mellitus     Gastroparesis due to DM 09/09/2019    Hypertension     Psoriasis     Substance abuse         Surgical History   Past Surgical History:   Procedure Laterality Date    PR ANESTH,CLEFT LIP REPAIR Right 04/20/2018    Procedure: DEBRIDEMENT; SKIN, SUBCUTANEOUS TISSUE, & MUSCLE HAND and FINGER wounds;  Surgeon: Josephine Nicolas, MD;  Location: MAIN OR [place];  Service: Plastics    PR APPLY FOREARM SPLINT,STATIC Right 05/05/2018    Procedure: application right short arm ulnar gutter splint;  Surgeon: Josephine Nicolas, MD;  Location: MAIN OR [place];  Service: Plastics    PR APPLY FOREARM SPLINT,STATIC Right 05/11/2018    Procedure: Applic Short Arm Splint; Static;  Surgeon: Josephine Nicolas, MD;  Location: MAIN OR [place]; Service: Plastics    PR DEBRIDEMENT MUSCLE &/FASCIA 1ST 20 SQ CM/< Right 04/27/2018    Procedure: debridement right palmar wound and dorsal right small finger wound;  Surgeon: Josephine Nicolas, MD;  Location: MAIN OR [place];  Service: Plastics    PR FTH/GF FR W/DIR CLSR F/C/C/M/N/AX/G/H/F 20SQCM/< Right 05/11/2018    Procedure: Full thickness skin graft to right palmar hand and right small finger wounds;  Surgeon: Josephine Nicolas, MD;  Location: MAIN OR [place];  Service: Plastics    PR NEGATIVE PRESSURE WOUND THERAPY DME <= 50 SQ CM Right 04/22/2018    Procedure: Right Hand Wound Vac Change;  Surgeon: Davis Esters, MD;  Location: MAIN OR [place];  Service: Plastics    PR SUB GRFT F/S/N/H/F/G/M/D /<100SCM /<1ST 25 SCM Right 05/05/2018    Procedure: application allograft human skin to right hand and right small finger wounds;  Surgeon: Josephine Nicolas, MD;  Location: MAIN OR ;  Service: Plastics    PR WOUND PREP, PED, TRK/ARM/LG 1ST 100 CM Right 05/05/2018    Procedure: Debridement right hand and small finger wounds in preparation for skin grafting;  Surgeon: Josephine Nicolas, MD;  Location: MAIN OR [facility];  Service: Plastics    PR WOUND PREP, PED, TRK/ARM/LG 1ST 100 CM Right 05/11/2018    Procedure: Surg Preparation/Creation  Of Recipient Site By Excision Of Open Wound/Burn Eschar/Scar, Or Incisional Release Of Scar Contracture, Trunk/Arms/Legs; 1st 100 Sq Cm Or 1% Body Area Of Infants & Children;  Surgeon: Eagen Gene Deune, MD;  Location: MAIN OR [place];  Service: Plastics    Right toe amputation        Medications   Current Outpatient Medications   Medication Sig Dispense Refill    alcohol swabs (ALCOHOL PADS) PadM Apply 1 swab topically four (4) times a day. 400 each 1    atorvastatin (LIPITOR) 40 MG tablet Take 1 tablet (40 mg total) by mouth daily. 90 tablet 2    betamethasone valerate (VALISONE) 0.1 % ointment Apply to thick areas of psoriasis twice daily as needed until smooth. Avoid face and skin folds. 45 g 8 blood sugar diagnostic Strp Accu Check Aviva Strip, use 4 times per day for monitoring insulin dependant uncontrolled diabetes      blood-glucose meter kit Use as instructed 1 each 0    chlorthalidone (HYGROTON) 25 MG tablet Take 1 tablet (25 mg total) by mouth every morning. 100 tablet 3    diaper,brief,adult,disposable Misc 1 each by Miscellaneous route two (2) times a day. 20 each 2    dulaglutide (TRULICITY) 1.5 mg/0.5 mL PnIj Inject 0.5 mL (1.5 mg total) under the skin every seven (7) days.      DULoxetine (CYMBALTA) 20 MG capsule       empagliflozin (JARDIANCE) 10 mg tablet Take 2 tablets (20 mg total) by mouth daily. 180 tablet 2    empty container Misc Use as directed to dispose of Stelara syringes. 1 each 2    flash glucose sensor (FREESTYLE LIBRE 14 DAY SENSOR) kit Apply new sensor every 14 days and use as directed to monitor and check blood sugars 2 each 12    fluocinolone acetonide oil 0.01 % Drop Apply 2 -4 drops to ears twice a day 20 mL 4    gabapentin (NEURONTIN) 800 MG tablet Take 1 tablet (800 mg total) by mouth Three (3) times a day. (Patient taking differently: Take 1 tablet (800 mg total) by mouth Four (4) times a day with a meal and nightly.) 270 tablet 2    hydrocortisone 2.5 % ointment APPLY TO PSORIASIS IN EARS UP TO TWICE DAILY AS NEEDED UNTIL SMOOTH 28.35 g 0    insulin aspart (NOVOLOG FLEXPEN U-100 INSULIN) 100 unit/mL (3 mL) injection pen Inject 0.1 mL (10 Units total) under the skin Three (3) times a day before meals. 27 mL 3    insulin detemir U-100 (LEVEMIR FLEXPEN) 100 unit/mL (3 mL) injection pen Inject 0.52 mL (52 Units total) under the skin daily. (Patient not taking: Reported on 07/15/2023) 45 mL 0    NIFEdipine (PROCARDIA XL) 30 MG 24 hr tablet Take 1 tablet (30 mg total) by mouth daily. 100 tablet 3    nortriptyline (PAMELOR) 10 MG capsule Take 1 capsule (10 mg total) by mouth nightly.      omeprazole (PRILOSEC) 20 MG capsule Take 1 capsule (20 mg total) by mouth daily. 90 capsule 1    polyethylene glycol (GOLYTELY) 236-22.74-6.74 gram solution Take by mouth as directed per [facility] GI prep instructions, for split bowel prep. 4000 mL 0    testosterone 20.25 mg/1.25 gram (1.62 %) gel pump PLACE 2 SPRAYS ON THE SKIN DAILY 3.65 g 3    ustekinumab (STELARA) 45 mg/0.5 mL Syrg syringe Inject the contents of 1 syringe (45 mg total) under the skin every 12  weeks. 2 mL 3     No current facility-administered medications for this visit.      Allergies   Patient has no known allergies.     Social History Social History     Socioeconomic History    Marital status: Single   Tobacco Use    Smoking status: Never     Passive exposure: Never    Smokeless tobacco: Never   Vaping Use    Vaping status: Never Used   Substance and Sexual Activity    Alcohol use: Yes     Comment: 2 beers/day average    Drug use: Yes     Frequency: 7.0 times per week     Types: Marijuana     Comment: h/o cocaine abuse; daily marijuana    Sexual activity: Not Currently   Other Topics Concern    Do you use sunscreen? Yes    Tanning bed use? No    Are you easily burned? No    Excessive sun exposure? Yes    Blistering sunburns? No   Social History Narrative    Hx of cocaine abuse.  Denies tobacco.     Social Drivers of Psychologist, prison and probation services Strain: Low Risk  (07/24/2022)    Overall Financial Resource Strain (CARDIA)     Difficulty of Paying Living Expenses: Not hard at all   Food Insecurity: No Food Insecurity (06/03/2023)    Hunger Vital Sign     Worried About Running Out of Food in the Last Year: Never true     Ran Out of Food in the Last Year: Never true   Transportation Needs: No Transportation Needs (06/03/2023)    PRAPARE - Therapist, art (Medical): No     Lack of Transportation (Non-Medical): No   Physical Activity: Insufficiently Active (11/30/2020)    Exercise Vital Sign     Days of Exercise per Week: 3 days     Minutes of Exercise per Session: 40 min   Stress: No Stress Concern Present (11/30/2020)    [name] of Occupational Health - Occupational Stress Questionnaire     Feeling of Stress : Only a little   Social Connections: Unknown (11/30/2020)    Social Connection and Isolation Panel [NHANES]     Frequency of Communication with Friends and Family: More than three times a week     Frequency of Social Gatherings with Friends and Family: More than three times a week     Attends Religious Services: Patient declined     Database administrator or Organizations: Patient declined     Attends Banker Meetings: Patient declined     Marital Status: Divorced   Housing: Low Risk  (06/03/2023)    Housing     Within the past 12 months, have you ever stayed: outside, in a car, in a tent, in an overnight shelter, or temporarily in someone else's home (i.e. couch-surfing)?: No     Are you worried about losing your housing?: No         Family History Patient denies significant family history.         Review of Systems A 10 system review of systems in addition to the musculoskeletal system was performed by intake questionnaire and was negative except as noted in HPI.     OBJECTIVE:  Physical Examination:    General  Well nourished, appearing stated age   Not in acute distress  Alert and oriented x3  Appropriate affect and mood  Appropriate to conversation   No increased work of breathing on room air    Musculoskeletal  Right SF severe flexion contracture of the MCPJ, PIPJ, and DIPJ - small finger flexed all the way into the palm. Well healed skin graft at MP flexion crease.       Test Results  Imaging  none    Problem List  Active Problems:    * No active hospital problems. *

## 2023-09-09 DIAGNOSIS — E1142 Type 2 diabetes mellitus with diabetic polyneuropathy: Principal | ICD-10-CM

## 2023-09-09 MED ORDER — GABAPENTIN 800 MG TABLET
ORAL_TABLET | Freq: Three times a day (TID) | ORAL | 2 refills | 90.00000 days | Status: CP
Start: 2023-09-09 — End: 2024-09-08

## 2023-09-09 NOTE — Unmapped (Signed)
 Patients pharmacy is requesting the following refill  Requested Prescriptions     Pending Prescriptions Disp Refills    gabapentin (NEURONTIN) 800 MG tablet 270 tablet 2     Sig: Take 1 tablet (800 mg total) by mouth Three (3) times a day.       Recent Visits  Date Type Provider Dept   06/03/23 Office Visit Jeffie Mingle, Adelbert Adler, FNP [facility] [address] [facility]   03/03/23 Office Visit Jeffie Mingle, Adelbert Adler, FNP [facility] [address] [facility]   11/29/22 Office Visit Clapp, Adelbert Adler, FNP [facility] [address] [facility]   Showing recent visits within past 365 days and meeting all other requirements  Future Appointments  Date Type Provider Dept   12/02/23 Appointment Jeffie Mingle, Adelbert Adler, FNP [facility] [address] [facility]   Showing future appointments within next 365 days and meeting all other requirements       Labs: Not applicable this refill

## 2023-09-22 NOTE — Unmapped (Signed)
 This patient called our office about his insurance removeing Stelara from their medication list. They are changing to [place] or [place]. The patient is concerned about getting the right kind of medication for himself. He would like to discuss this with his provider or a nurse. He wants to speak with someone..  Thank you

## 2023-09-29 DIAGNOSIS — L409 Psoriasis, unspecified: Principal | ICD-10-CM

## 2023-09-29 DIAGNOSIS — Z79899 Other long term (current) drug therapy: Principal | ICD-10-CM

## 2023-09-29 MED ORDER — USTEKINUMAB 45 MG/0.5 ML SUBCUTANEOUS SYRINGE
3 refills | 0.00000 days
Start: 2023-09-29 — End: ?

## 2023-09-30 NOTE — Unmapped (Signed)
 I called and left a VM for patient requesting more details for his reasoning in wanting a EGD.

## 2023-09-30 NOTE — Unmapped (Signed)
 Spoke with Cory Bentley regarding upcoming GI procedure on 10/06/23 at [facility] location at 1230 with an arrival time of 1130.     Informed patient prep instructions were sent via Mail on 06/23/23.  Confirmed instructions were received, reviewed and understood. No questions at this time. Number provided for future questions if they arise.    Verified prep medications ordered/received.    Reviewed information regarding holding jardiance, chlorthalidone and short acting insulins day of procedure (until after procedure), taking half dose of long acting insulin day before procedure and holding day of procedure as well as holding vitamins/supplements the day before and day of procedure.     Reviewed low fiber diet should have started three days prior to procedure.    Reviewed LIQUID diet requirement the entire day prior to procedure and NOTHING AT ALL 2 hours prior to procedure    Reviewed driver requirement (18 or older, must be able to drive patient home, NO UBER OR LYFT FOR PICK-UP, MUST SIGN DISCHARGE PAPERS, must remain within 20 minutes of facility and reachable by cell phone).    No further questions at this time.

## 2023-09-30 NOTE — Unmapped (Signed)
 Patient called back stating he thought that when you get one you are to also get the other being that it was completed that way during his first one. Patient states he had 2 polyps the first time he had one. Recently he has also been experiencing difficulty swallowing.

## 2023-10-01 DIAGNOSIS — Z79899 Other long term (current) drug therapy: Principal | ICD-10-CM

## 2023-10-01 DIAGNOSIS — L409 Psoriasis, unspecified: Principal | ICD-10-CM

## 2023-10-01 MED ORDER — USTEKINUMAB 45 MG/0.5 ML SUBCUTANEOUS SYRINGE
SUBCUTANEOUS | 2 refills | 0.00000 days | Status: CP
Start: 2023-10-01 — End: 2023-10-01
  Filled 2023-10-03: qty 0.5, 84d supply, fill #0

## 2023-10-01 MED ORDER — YESINTEK 45 MG/0.5 ML SUBCUTANEOUS SYRINGE
SUBCUTANEOUS | 2 refills | 0.00000 days | Status: CP
Start: 2023-10-01 — End: ?

## 2023-10-01 NOTE — Unmapped (Signed)
 [facility]    Patient Onboarding/Medication Counseling    Mr.Cory Bentley is a 63 y.o. male with psoriasis who I am counseling today on initiation of therapy.  I am speaking to the patient.    Patient switching from Stelara to [place].    Was a Nurse, learning disability used for this call? No    Verified patient's date of birth / HIPAA.    Specialty medication(s) to be sent: Inflammatory Disorders: Yesintek      Non-specialty medications/supplies to be sent: None      Medications not needed at this time: n/a         Hansel (ustekinumab-kfce)   The patient declined counseling on medication administration, missed dose instructions, goals of therapy, side effects and monitoring parameters, warnings and precautions, drug/food interactions, and storage, handling precautions, and disposal because they were taking Stelara and feel comfortable injecting the biosimilar. The information in the declined sections below are for informational purposes only and was not discussed with patient.     Medication & Administration     Dosage: Plaque psoriasis (over 12yo and 60-100kg): Inject 45mg  under the skin at weeks 0 and 4, then every 12 weeks thereafter Patient was on maintenance dose of Stelara (Inject the contents of 1 syringe (45 mg total) under the skin every 12 weeks)    Lab tests required prior to treatment initiation:  Tuberculosis: Tuberculosis screening resulted in a non-reactive Quantiferon TB Gold assay. 01/07/2023    Administration:     Artist all supplies needed for injection on a clean, flat working surface: medication syringe(s) removed from packaging, alcohol swab, sharps container, etc.  Look at the medication label - look for correct medication, correct dose, and check the expiration date  Look at the medication - the liquid in the syringe should appear clear and colorless to slightly yellow, you may see a few white particles  Lay the syringe on a flat surface and allow it to warm up to room temperature for at least 15-30 minutes  Select injection site - you can use the front of your thigh or your belly (but not the area 2 inches around your belly button); if someone else is giving you the injection you can also use your upper arm in the skin covering your triceps muscle or in the buttocks  Prepare injection site - wash your hands and clean the skin at the injection site with an alcohol swab and let it air dry, do not touch the injection site again before the injection  Pull off the needle safety cap, do not remove until immediately prior to injection  Pinch the skin - with your hand not holding the syringe pinch up a fold of skin at the injection site using your forefinger and thumb  Insert the needle into the fold of skin at about a 45 degree angle - it's best to use a quick dart-like motion  Push the plunger down slowly as far as it will go until the syringe is empty, if the plunger is not fully depressed the needle shield will not extend to cover the needle when it is removed, hold the syringe in place for a full 5 seconds  Check that the syringe is empty and keep pressing down on the plunger while you pull the needle out at the same angle as inserted; after the needle is removed completely from the skin, release the plunger allowing the needle shield to activate and cover the used needle  Dispose of the used syringe immediately in your sharps disposal container, do not attempt to recap the needle prior to disposing  If you see any blood at the injection site, press a cotton ball or gauze on the site and maintain pressure until the bleeding stops, do not rub the injection site      Adherence/Missed dose instructions:  If your injection is given more than 7-10 days after your scheduled injection date - consult your pharmacist for additional instructions on how to adjust your dosing schedule.    Goals of Therapy     Crohn's disease  Achieve remission of symptoms  Maintain remission of symptoms  Minimize long-term systemic glucocorticoid use  Prevent need for surgical procedures  Maintenance of effective psychosocial functioning    Plaque Psoriasis  Minimize areas of skin involvement (% BSA)  Avoidance of long term glucocorticoid use  Maintenance of effective psychosocial functioning    Psoriatic arthritis  Achieve remission/inactive disease or low/minimal disease activity  Maintenance of function  Minimization of systemic manifestations and comorbidities  Maintenance of effective psychosocial functioning    Ulcerative colitis  Achieve remission of symptoms  Maintain remission of symptoms  Minimize long-term systemic glucocorticoid use  Prevent need for surgical procedures  Maintenance of effective psychosocial functioning      Side Effects & Monitoring Parameters     Injection site reaction (redness, irritation, inflammation localized to the site of administration)  Signs of a common cold - minor sore throat, runny or stuffy nose, etc.  Feeling tired or weak  Headache    The following side effects should be reported to the provider:  Signs of a hypersensitivity reaction - rash; hives; itching; red, swollen, blistered, or peeling skin; wheezing; tightness in the chest or throat; difficulty breathing, swallowing, or talking; swelling of the mouth, face, lips, tongue, or throat; etc.  Reduced immune function - report signs of infection such as fever; chills; body aches; very bad sore throat; ear or sinus pain; cough; more sputum or change in color of sputum; pain with passing urine; wound that will not heal, etc.  Also at a slightly higher risk of some malignancies (mainly skin and blood cancers) due to this reduced immune function.  In the case of signs of infection - the patient should hold the next dose of Yesintek and call your primary care provider to ensure adequate medical care.  Treatment may be resumed when infection is treated and patient is asymptomatic.  Changes in skin - a new growth or lump that forms; changes in shape, size, or color of a previous mole or marking  Shortness of breath or chest pain  Vaginal itching or discharge      Contraindications, Warnings, & Precautions     Have your bloodwork checked as you have been told by your prescriber  Talk with your doctor if you are pregnant, planning to become pregnant, or breastfeeding  Discuss the possible need for holding your dose(s) of Yesintek when a planned procedure is scheduled with the prescriber as it may delay healing/recovery timeline       Drug/Food Interactions     Medication list reviewed in Epic. The patient was instructed to inform the care team before taking any new medications or supplements. No drug interactions identified.   If you have a latex allergy use caution when handling, the needle cap of the Yesintek prefilled syringe contains a derivative of natural rubber latex  Talk with you prescriber or pharmacist before receiving any live  vaccinations while taking this medication and after you stop taking it    Storage, Handling Precautions, & Disposal     Store this medication in the refrigerator.  Do not freeze  If needed, you may store at room temperature for up to 30 days  Store in original packaging, protected from light  Do not shake  Dispose of used syringes/pens in a sharps disposal container          Current Medications (including OTC/herbals), Comorbidities and Allergies     Current Medications[1]    Allergies[2]    Problem List[3]    Medication list has been reviewed and updated in Epic: Yes    Allergies have been reviewed and updated in Epic: Yes    Appropriateness of Therapy     Acute infections noted within Epic:  No active infections  Patient reported infection: None    Is the medication and dose appropriate based on diagnosis, medication list, comorbidities, allergies, medical history, patient???s ability to self-administer the medication, and therapeutic goals? Yes    Prescription has been clinically reviewed: Yes      Baseline Quality of Life Assessment      How many days over the past month did your condition  keep you from your normal activities? For example, brushing your teeth or getting up in the morning. 0    Financial Information     Medication Assistance provided: Prior Authorization    Anticipated copay of $0 / 84DS reviewed with patient. Verified delivery address.    Delivery Information     Scheduled delivery date: 10/03/2023    Expected start date: 10/03/2023      Medication will be delivered via Same Day Courier to the prescription address in [facility].  This shipment will not require a signature.      Explained the services we provide at [facility] and that each month we would call to set up refills.  Stressed importance of returning phone calls so that we could ensure they receive their medications in time each month.  Informed patient that we should be setting up refills 7-10 days prior to when they will run out of medication.  A pharmacist will reach out to perform a clinical assessment periodically.  Informed patient that a welcome packet, containing information about our pharmacy and other support services, a Notice of Privacy Practices, and a drug information handout will be sent.      The patient or caregiver noted above participated in the development of this care plan and knows that they can request review of or adjustments to the care plan at any time.      Patient or caregiver verbalized understanding of the above information as well as how to contact the pharmacy at [phone] option 4 with any questions/concerns.  The pharmacy is open Monday through Friday 8:30am-4:30pm.  A pharmacist is available 24/7 via pager to answer any clinical questions they may have.    Patient Specific Needs     Does the patient have any physical, cognitive, or cultural barriers? No    Does the patient have adequate living arrangements? (i.e. the ability to store and take their medication appropriately) Yes    Did you identify any home environmental safety or security hazards? No    Patient prefers to have medications discussed with  Patient     Is the patient or caregiver able to read and understand education materials at a high school level or above? Yes  Patient's primary language is  English     Is the patient high risk? No    Does the patient have an additional or emergency contact listed in their chart? Yes    SOCIAL DETERMINANTS OF HEALTH     At the [facility], we have learned that life circumstances - like trouble affording food, housing, utilities, or transportation can affect the health of many of our patients.   That is why we wanted to ask: are you currently experiencing any life circumstances that are negatively impacting your health and/or quality of life? No    Social Drivers of Health     Food Insecurity: No Food Insecurity (06/03/2023)    Hunger Vital Sign     Worried About Running Out of Food in the Last Year: Never true     Ran Out of Food in the Last Year: Never true   Tobacco Use: Low Risk  (07/31/2023)    Received from [facility]    Patient History     Smoking Tobacco Use: Never     Smokeless Tobacco Use: Never     Passive Exposure: Not on file   Transportation Needs: No Transportation Needs (06/03/2023)    PRAPARE - Transportation     Lack of Transportation (Medical): No     Lack of Transportation (Non-Medical): No   Alcohol Use: Not At Risk (11/30/2020)    Alcohol Use     How often do you have a drink containing alcohol?: 4+ times per week     How many drinks containing alcohol do you have on a typical day when you are drinking?: 1 - 2     How often do you have 5 or more drinks on one occasion?: Never   Housing: Low Risk  (06/03/2023)    Housing     Within the past 12 months, have you ever stayed: outside, in a car, in a tent, in an overnight shelter, or temporarily in someone else's home (i.e. couch-surfing)?: No     Are you worried about losing your housing?: No   Physical Activity: Insufficiently Active (11/30/2020)    Exercise Vital Sign     Days of Exercise per Week: 3 days     Minutes of Exercise per Session: 40 min   Utilities: Low Risk  (06/03/2023)    Utilities     Within the past 12 months, have you been unable to get utilities (heat, electricity) when it was really needed?: No   Stress: No Stress Concern Present (11/30/2020)    [name] of Occupational Health - Occupational Stress Questionnaire     Feeling of Stress : Only a little   Interpersonal Safety: Not At Risk (07/24/2022)    Interpersonal Safety     Unsafe Where You Currently Live: No     Physically Hurt by Anyone: No     Abused by Anyone: No   Substance Use: Not on file (02/17/2023)   Intimate Partner Violence: Not At Risk (11/30/2020)    Humiliation, Afraid, Rape, and Kick questionnaire     Fear of Current or Ex-Partner: No     Emotionally Abused: No     Physically Abused: No     Sexually Abused: No   Social Connections: Unknown (11/30/2020)    Social Connection and Isolation Panel     Frequency of Communication with Friends and Family: More than three times a week     Frequency of Social Gatherings with Friends and Family: More  than three times a week     Attends Religious Services: Patient declined     Active Member of Clubs or Organizations: Patient declined     Attends Banker Meetings: Patient declined     Marital Status: Divorced   Programmer, applications: Low Risk  (07/24/2022)    Overall Financial Resource Strain (CARDIA)     Difficulty of Paying Living Expenses: Not hard at all   Health Literacy: Low Risk  (07/24/2022)    Health Literacy     : Never   Internet Connectivity: Possible Internet connectivity concern identified (09/05/2021)    Internet Connectivity     Do you have access to internet services: Yes     How do you connect to the internet: Not on file     Is your internet connection strong enough for you to watch video on your device without major problems?: Yes     Do you have enough data to get through the month?: Yes     Does at least one of the devices have a camera that you can use for video chat?: Yes       Would you be willing to receive help with any of the needs that you have identified today? No       Velma Ober, PharmD  [facility] Specialty Pharmacist       [1]   Current Outpatient Medications   Medication Sig Dispense Refill    alcohol swabs (ALCOHOL PADS) PadM Apply 1 swab topically four (4) times a day. 400 each 1    atorvastatin (LIPITOR) 40 MG tablet Take 1 tablet (40 mg total) by mouth daily. 90 tablet 2    betamethasone valerate (VALISONE) 0.1 % ointment Apply to thick areas of psoriasis twice daily as needed until smooth. Avoid face and skin folds. 45 g 8    blood sugar diagnostic Strp Accu Check Aviva Strip, use 4 times per day for monitoring insulin dependant uncontrolled diabetes      blood-glucose meter kit Use as instructed 1 each 0    chlorthalidone (HYGROTON) 25 MG tablet Take 1 tablet (25 mg total) by mouth every morning. 100 tablet 3    diaper,brief,adult,disposable Misc 1 each by Miscellaneous route two (2) times a day. 20 each 2    dulaglutide (TRULICITY) 1.5 mg/0.5 mL PnIj Inject 0.5 mL (1.5 mg total) under the skin every seven (7) days.      DULoxetine (CYMBALTA) 20 MG capsule       empagliflozin (JARDIANCE) 10 mg tablet Take 2 tablets (20 mg total) by mouth daily. 180 tablet 2    empty container Misc Use as directed to dispose of Stelara syringes. 1 each 2    flash glucose sensor (FREESTYLE LIBRE 14 DAY SENSOR) kit Apply new sensor every 14 days and use as directed to monitor and check blood sugars 2 each 12    fluocinolone acetonide oil 0.01 % Drop Apply 2 -4 drops to ears twice a day 20 mL 4    gabapentin (NEURONTIN) 800 MG tablet Take 1 tablet (800 mg total) by mouth Three (3) times a day. 270 tablet 2    hydrocortisone 2.5 % ointment APPLY TO PSORIASIS IN EARS UP TO TWICE DAILY AS NEEDED UNTIL SMOOTH 28.35 g 0    insulin aspart (NOVOLOG FLEXPEN U-100 INSULIN) 100 unit/mL (3 mL) injection pen Inject 0.1 mL (10 Units total) under the skin Three (3) times a day before meals.  27 mL 3    insulin detemir U-100 (LEVEMIR FLEXPEN) 100 unit/mL (3 mL) injection pen Inject 0.52 mL (52 Units total) under the skin daily. (Patient not taking: Reported on 07/15/2023) 45 mL 0    NIFEdipine (PROCARDIA XL) 30 MG 24 hr tablet Take 1 tablet (30 mg total) by mouth daily. 100 tablet 3    nortriptyline (PAMELOR) 10 MG capsule Take 1 capsule (10 mg total) by mouth nightly.      omeprazole (PRILOSEC) 20 MG capsule Take 1 capsule (20 mg total) by mouth daily. 90 capsule 1    polyethylene glycol (GOLYTELY) 236-22.74-6.74 gram solution Take by mouth as directed per [facility] GI prep instructions, for split bowel prep. 4000 mL 0    testosterone 20.25 mg/1.25 gram (1.62 %) gel pump PLACE 2 SPRAYS ON THE SKIN DAILY 3.65 g 3    ustekinumab-kfce (YESINTEK) 45 mg/0.5 mL Syrg Inject the contents of 1 syringe (45 mg total) under the skin every 12 weeks. 2 mL 2     No current facility-administered medications for this visit.   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

## 2023-10-02 NOTE — Unmapped (Signed)
 [facility] Specialty Medication Onboarding    Specialty Medication: YESINTEK 45 mg/0.5 mL Syrg (ustekinumab-kfce)  Prior Authorization: Approved   Financial Assistance: No - copay  <$25  Final Copay/Day Supply: $0 / 84    Insurance Restrictions: None     Notes to Pharmacist:   Credit Card on File: not applicable  Start Date on Rx: 10/01/23    The triage team has completed the benefits investigation and has determined that the patient is able to fill this medication at [facility]. Please contact the patient to complete the onboarding or follow up with the prescribing physician as needed.

## 2023-10-03 NOTE — Unmapped (Signed)
 Attempted to return call regarding food question, got VM. Left message that instructions were sent by mail and that prep information can also be found on the [facility] website.

## 2023-10-06 ENCOUNTER — Inpatient Hospital Stay: Admit: 2023-10-06 | Discharge: 2023-10-06 | Payer: MEDICARE

## 2023-10-06 ENCOUNTER — Encounter: Admit: 2023-10-06 | Discharge: 2023-10-06 | Payer: Medicare (Managed Care)

## 2023-10-06 ENCOUNTER — Encounter
Admit: 2023-10-06 | Discharge: 2023-10-06 | Payer: Medicare (Managed Care) | Attending: Gastroenterology | Primary: Gastroenterology

## 2023-10-06 MED ADMIN — propofol (DIPRIVAN) infusion 10 mg/mL: INTRAVENOUS | @ 17:00:00 | Stop: 2023-10-06

## 2023-10-06 MED ADMIN — phenylephrine (NEO-SYNEPHRINE) injection: INTRAVENOUS | @ 17:00:00 | Stop: 2023-10-06

## 2023-10-06 MED ADMIN — Propofol (DIPRIVAN) injection: INTRAVENOUS | @ 17:00:00 | Stop: 2023-10-06

## 2023-10-06 MED ADMIN — sodium chloride (NS) 0.9 % infusion: 10 mL/h | INTRAVENOUS | @ 17:00:00 | Stop: 2023-10-06

## 2023-10-08 DIAGNOSIS — K219 Gastro-esophageal reflux disease without esophagitis: Principal | ICD-10-CM

## 2023-10-08 MED ORDER — OMEPRAZOLE 20 MG CAPSULE,DELAYED RELEASE
ORAL_CAPSULE | Freq: Every day | ORAL | 1 refills | 90.00000 days | Status: CP
Start: 2023-10-08 — End: ?

## 2023-10-08 NOTE — Unmapped (Signed)
 Patient is requesting the following refill  Requested Prescriptions     Pending Prescriptions Disp Refills    omeprazole  (PRILOSEC) 20 MG capsule [Pharmacy Med Name: OMEPRAZOLE  20MG  CAPSULES] 90 capsule 1     Sig: TAKE 1 CAPSULE(20 MG) BY MOUTH DAILY       Recent Visits  Date Type Provider Dept   06/03/23 Office Visit [name], FNP [facility] [address] [facility]   03/03/23 Office Visit Clapp, Verneita Molt, FNP [facility] [address] At [facility]   11/29/22 Office Visit Clapp, Verneita Molt, FNP [facility] [address] At [facility]   Showing recent visits within past 365 days and meeting all other requirements  Future Appointments  Date Type Provider Dept   12/02/23 Appointment Gayle, Verneita Molt, FNP [facility] [address] At [facility]   Showing future appointments within next 365 days and meeting all other requirements       Labs: Not applicable this refill

## 2023-10-14 DIAGNOSIS — E1169 Type 2 diabetes mellitus with other specified complication: Principal | ICD-10-CM

## 2023-10-14 DIAGNOSIS — E785 Hyperlipidemia, unspecified: Principal | ICD-10-CM

## 2023-10-14 MED ORDER — ATORVASTATIN 40 MG TABLET
ORAL_TABLET | Freq: Every day | ORAL | 2 refills | 90.00000 days | Status: CP
Start: 2023-10-14 — End: ?

## 2023-10-14 NOTE — Unmapped (Signed)
 Patient is requesting the following refill  Requested Prescriptions     Pending Prescriptions Disp Refills    atorvastatin  (LIPITOR ) 40 MG tablet [Pharmacy Med Name: ATORVASTATIN  40MG  TABLETS] 90 tablet 2     Sig: TAKE 1 TABLET(40 MG) BY MOUTH DAILY       Recent Visits  Date Type Provider Dept   06/03/23 Office Visit Gayle, Verneita Molt, FNP [facility] [address] At [facility]   03/03/23 Office Visit Clapp, Verneita Molt, FNP [facility] [address] At [facility]   11/29/22 Office Visit Clapp, Verneita Molt, FNP [facility] [address] At [facility]   Showing recent visits within past 365 days and meeting all other requirements  Future Appointments  Date Type Provider Dept   12/02/23 Appointment Gayle, Verneita Molt, FNP [facility] [address] At [facility]   Showing future appointments within next 365 days and meeting all other requirements       Labs: Cholesterol:   Cholesterol (mg/dL)   Date Value   89/85/7978 191     Cholesterol, Total (mg/dL)   Date Value   91/83/7975 116   ,   Triglycerides (mg/dL)   Date Value   91/83/7975 87   01/27/2020 120   ,   HDL (mg/dL)   Date Value   89/85/7978 49     Cholesterol, HDL (mg/dL)   Date Value   91/83/7975 36 (L)   ,   LDL Direct (mg/dl)   Date Value   89/85/7978 118 (H)     Cholesterol, LDL, Calculated (mg/dL)   Date Value   91/83/7975 63

## 2023-10-15 ENCOUNTER — Encounter: Admit: 2023-10-15 | Discharge: 2023-10-15 | Payer: Medicare (Managed Care) | Attending: Nephrology | Primary: Nephrology

## 2023-10-15 DIAGNOSIS — E875 Hyperkalemia: Principal | ICD-10-CM

## 2023-10-15 DIAGNOSIS — E1121 Type 2 diabetes mellitus with diabetic nephropathy: Principal | ICD-10-CM

## 2023-10-15 DIAGNOSIS — I10 Essential (primary) hypertension: Principal | ICD-10-CM

## 2023-10-15 DIAGNOSIS — R809 Proteinuria, unspecified: Principal | ICD-10-CM

## 2023-10-15 DIAGNOSIS — D649 Anemia, unspecified: Principal | ICD-10-CM

## 2023-10-15 LAB — ALBUMIN / CREATININE URINE RATIO
ALBUMIN QUANT URINE: 24.3 mg/dL
ALBUMIN/CREATININE RATIO: 468.2 ug/mg — ABNORMAL HIGH (ref 0.0–30.0)
CREATININE, URINE: 51.9 mg/dL

## 2023-10-15 LAB — CBC W/ AUTO DIFF
BASOPHILS ABSOLUTE COUNT: 0.1 10*9/L (ref 0.0–0.1)
BASOPHILS RELATIVE PERCENT: 1.5 %
EOSINOPHILS ABSOLUTE COUNT: 0.6 10*9/L — ABNORMAL HIGH (ref 0.0–0.5)
EOSINOPHILS RELATIVE PERCENT: 6.8 %
HEMATOCRIT: 39.9 % (ref 39.0–48.0)
HEMOGLOBIN: 13.5 g/dL (ref 12.9–16.5)
LYMPHOCYTES ABSOLUTE COUNT: 1.7 10*9/L (ref 1.1–3.6)
LYMPHOCYTES RELATIVE PERCENT: 21 %
MEAN CORPUSCULAR HEMOGLOBIN CONC: 33.7 g/dL (ref 32.0–36.0)
MEAN CORPUSCULAR HEMOGLOBIN: 29.8 pg (ref 25.9–32.4)
MEAN CORPUSCULAR VOLUME: 88.4 fL (ref 77.6–95.7)
MEAN PLATELET VOLUME: 12.7 fL — ABNORMAL HIGH (ref 6.8–10.7)
MONOCYTES ABSOLUTE COUNT: 1 10*9/L — ABNORMAL HIGH (ref 0.3–0.8)
MONOCYTES RELATIVE PERCENT: 12.4 %
NEUTROPHILS ABSOLUTE COUNT: 4.8 10*9/L (ref 1.8–7.8)
NEUTROPHILS RELATIVE PERCENT: 58.3 %
PLATELET COUNT: 164 10*9/L (ref 150–450)
RED BLOOD CELL COUNT: 4.51 10*12/L (ref 4.26–5.60)
RED CELL DISTRIBUTION WIDTH: 13.2 % (ref 12.2–15.2)
WBC ADJUSTED: 8.3 10*9/L (ref 3.6–11.2)

## 2023-10-15 LAB — RENAL FUNCTION PANEL
ALBUMIN: 3.9 g/dL (ref 3.4–5.0)
ANION GAP: 10 mmol/L (ref 5–14)
BLOOD UREA NITROGEN: 34 mg/dL — ABNORMAL HIGH (ref 9–23)
BUN / CREAT RATIO: 32
CALCIUM: 9.8 mg/dL (ref 8.7–10.4)
CHLORIDE: 99 mmol/L (ref 98–107)
CO2: 30 mmol/L (ref 20.0–31.0)
CREATININE: 1.05 mg/dL (ref 0.73–1.18)
EGFR CKD-EPI (2021) MALE: 81 mL/min/1.73m2 (ref >=60)
GLUCOSE RANDOM: 209 mg/dL — ABNORMAL HIGH (ref 70–179)
PHOSPHORUS: 3.4 mg/dL (ref 2.4–5.1)
POTASSIUM: 4.8 mmol/L (ref 3.5–5.1)
SODIUM: 139 mmol/L (ref 135–145)

## 2023-10-15 LAB — FERRITIN: FERRITIN: 27.5 ng/mL (ref 10.5–307.3)

## 2023-10-15 LAB — LIPID PANEL
CHOLESTEROL: 165 mg/dL (ref <200)
HDL CHOLESTEROL: 46 mg/dL (ref >40)
LDL CHOLESTEROL CALCULATED: 99 mg/dL (ref <100)
NON-HDL CHOLESTEROL: 119 mg/dL (ref <130)
TRIGLYCERIDES: 128 mg/dL (ref <150)

## 2023-10-15 LAB — IRON & TIBC
IRON SATURATION: 14 % — ABNORMAL LOW (ref 20–55)
IRON: 44 ug/dL — ABNORMAL LOW (ref 65–175)
TOTAL IRON BINDING CAPACITY: 323 ug/dL (ref 250–425)

## 2023-10-15 LAB — SLIDE REVIEW

## 2023-10-15 NOTE — Unmapped (Signed)
 Patient labs drawn in room prior to leaving Nephrology clinic, labs drawn by roving phlebotomy or patient sent to the lab due to rover wait time.

## 2023-10-18 DIAGNOSIS — M720 Palmar fascial fibromatosis [Dupuytren]: Principal | ICD-10-CM

## 2023-10-18 NOTE — Unmapped (Signed)
 HAND SURGERY PREOPERATIVE  HISTORY AND PHYSICAL      ASSESSMENT: Tami Barren is a 63 y.o. male who presents for surgical management of a R SF flexion contracture.SABRA      PLAN:   -OR for right small finger digit widget application      HISTORY OF PRESENT ILLNESS:    Jerusalem Wert is a 63 y.o. male who presents for surgical management of a R SF flexion contracture.SABRA        MEDICAL HISTORY:  Past Medical History[1]    SURGICAL HISTORY:  Past Surgical History[2]    MEDICATIONS:   Current Medications[3]    ALLERGIES:  has no known allergies.    FAMILY HISTORY:  Family History[4]    SOCIAL HISTORY:   Tobacco use:  reports that he has never smoked. He has never been exposed to tobacco smoke. He has never used smokeless tobacco.  Alcohol  use:  reports current alcohol  use.  Drug use:  reports current drug use. Frequency: 7.00 times per week. Drug: Marijuana.    REVIEW OF SYSTEMS:   Remainder of a 10 system review of systems is negative.     Objective     PHYSICAL EXAMINATION:   Patient will be examined in the preoperative area      DIAGNOSTIC STUDIES:   Results for orders placed or performed in visit on 10/15/23   Lipid Panel   Result Value Ref Range    Cholesterol, Total 165 <200 mg/dL    Cholesterol, HDL 46 >40 mg/dL    Cholesterol, LDL, Calculated 99 <100 mg/dL    Cholesterol, Non-HDL, Calculated 119 <130 mg/dL    Triglycerides 871 <849 mg/dL    Fasting Unknown    Ferritin   Result Value Ref Range    Ferritin 27.5 10.5 - 307.3 ng/mL   Iron and TIBC   Result Value Ref Range    Iron 44 (L) 65 - 175 ug/dL    TIBC 676 749 - 574 ug/dL    Iron Saturation (%) 14 (L) 20 - 55 %   Albumin/creatinine urine ratio   Result Value Ref Range    Creat U 51.9 Undefined mg/dL    Albumin Quantitative, Urine 24.3 Undefined mg/dL    Albumin/Creatinine Ratio 468.2 (H) 0.0 - 30.0 ug/mg   Renal Function Panel   Result Value Ref Range    Sodium 139 135 - 145 mmol/L    Potassium 4.8 3.5 - 5.1 mmol/L    Chloride 99 98 - 107 mmol/L    CO2 30.0 20.0 - 31.0 mmol/L    Anion Gap 10 5 - 14 mmol/L    BUN 34 (H) 9 - 23 mg/dL    Creatinine 8.94 9.26 - 1.18 mg/dL    BUN/Creatinine Ratio 32     eGFR CKD-EPI (2021) Male 81 >=60 mL/min/1.41m2    Glucose 209 (H) 70 - 179 mg/dL    Calcium 9.8 8.7 - 89.5 mg/dL    Phosphorus 3.4 2.4 - 5.1 mg/dL    Albumin 3.9 3.4 - 5.0 g/dL   CBC w/ Differential   Result Value Ref Range    WBC 8.3 3.6 - 11.2 10*9/L    RBC 4.51 4.26 - 5.60 10*12/L    HGB 13.5 12.9 - 16.5 g/dL    HCT 60.0 60.9 - 51.9 %    MCV 88.4 77.6 - 95.7 fL    MCH 29.8 25.9 - 32.4 pg    MCHC 33.7 32.0 - 36.0 g/dL    RDW 86.7  12.2 - 15.2 %    MPV 12.7 (H) 6.8 - 10.7 fL    Platelet 164 150 - 450 10*9/L    Neutrophils % 58.3 %    Lymphocytes % 21.0 %    Monocytes % 12.4 %    Eosinophils % 6.8 %    Basophils % 1.5 %    Absolute Neutrophils 4.8 1.8 - 7.8 10*9/L    Absolute Lymphocytes 1.7 1.1 - 3.6 10*9/L    Absolute Monocytes 1.0 (H) 0.3 - 0.8 10*9/L    Absolute Eosinophils 0.6 (H) 0.0 - 0.5 10*9/L    Absolute Basophils 0.1 0.0 - 0.1 10*9/L   Morphology Review   Result Value Ref Range    Smear Review Comments See Comment (A) Undefined       Imaging: None            [1]   Past Medical History:  Diagnosis Date    Cellulitis of left foot 07/24/2021    Diabetes mellitus        Gastroparesis due to DM    09/09/2019    Hypertension     Psoriasis     Substance abuse (CMS-HCC)    [2]   Past Surgical History:  Procedure Laterality Date    PR ANESTH,CLEFT LIP REPAIR Right 04/20/2018    Procedure: DEBRIDEMENT; SKIN, SUBCUTANEOUS TISSUE, & MUSCLE HAND and FINGER wounds;  Surgeon: Logan Amaryllis Aliment, MD;  Location: MAIN OR [place];  Service: Plastics    PR APPLY FOREARM SPLINT,STATIC Right 05/05/2018    Procedure: application right short arm ulnar gutter splint;  Surgeon: Logan Amaryllis Aliment, MD;  Location: MAIN OR [place];  Service: Plastics    PR APPLY FOREARM SPLINT,STATIC Right 05/11/2018    Procedure: Applic Short Arm Splint; Static;  Surgeon: Logan Amaryllis Aliment, MD;  Location: MAIN OR [place]; Service: Plastics    PR COLSC FLX W/RMVL OF TUMOR POLYP LESION SNARE TQ Left 10/06/2023    Procedure: COLONOSCOPY FLEX; W/REMOV TUMOR/LES BY SNARE;  Surgeon: Lucian Mikle Pfeiffer, MD;  Location: GI PROCEDURES [facility];  Service: Gastroenterology    PR DEBRIDEMENT MUSCLE &/FASCIA 1ST 20 SQ CM/< Right 04/27/2018    Procedure: debridement right palmar wound and dorsal right small finger wound;  Surgeon: Logan Amaryllis Aliment, MD;  Location: MAIN OR [place];  Service: Plastics    PR FTH/GF FR W/DIR CLSR F/C/C/M/N/AX/G/H/F 20SQCM/< Right 05/11/2018    Procedure: Full thickness skin graft to right palmar hand and right small finger wounds;  Surgeon: Logan Amaryllis Aliment, MD;  Location: MAIN OR [place];  Service: Plastics    PR NEGATIVE PRESSURE WOUND THERAPY DME <= 50 SQ CM Right 04/22/2018    Procedure: Right Hand Wound Vac Change;  Surgeon: Barnetta Everts, MD;  Location: MAIN OR [place];  Service: Plastics    PR SUB GRFT F/S/N/H/F/G/M/D /<100SCM /<1ST 25 SCM Right 05/05/2018    Procedure: application allograft human skin to right hand and right small finger wounds;  Surgeon: Logan Amaryllis Aliment, MD;  Location: MAIN OR ;  Service: Plastics    PR UPPER GI ENDOSCOPY,DIAGNOSIS Left 10/06/2023    Procedure: UGI ENDO, INCLUDE ESOPHAGUS, STOMACH, & DUODENUM &/OR JEJUNUM; DX W/WO COLLECTION SPECIMN, BY BRUSH OR WASH;  Surgeon: Lucian Mikle Pfeiffer, MD;  Location: [facility];  Service: Gastroenterology    PR WOUND PREP, PED, TRK/ARM/LG 1ST 100 CM Right 05/05/2018    Procedure: Debridement right hand and small finger wounds in preparation for skin grafting;  Surgeon: Eagen Gene Deune,  MD;  Location: MAIN OR [place];  Service: Plastics    PR WOUND PREP, PED, TRK/ARM/LG 1ST 100 CM Right 05/11/2018    Procedure: Surg Preparation/Creation Of Recipient Site By Excision Of Open Wound/Burn Eschar/Scar, Or Incisional Release Of Scar Contracture, Trunk/Arms/Legs; 1st 100 Sq Cm Or 1% Body Area Of Infants & Children;  Surgeon: Eagen Gene Deune, MD;  Location: MAIN OR [place];  Service: Plastics    Right toe amputation     [3]   Current Outpatient Medications   Medication Sig Dispense Refill    omeprazole  (PRILOSEC) 20 MG capsule TAKE 1 CAPSULE(20 MG) BY MOUTH DAILY 90 capsule 1    alcohol  swabs  (ALCOHOL  PADS) PadM Apply 1 swab topically four (4) times a day. 400 each 1    atorvastatin  (LIPITOR ) 40 MG tablet TAKE 1 TABLET(40 MG) BY MOUTH DAILY 90 tablet 2    betamethasone  valerate (VALISONE ) 0.1 % ointment Apply to thick areas of psoriasis twice daily as needed until smooth. Avoid face and skin folds. 45 g 8    blood sugar diagnostic Strp Accu Check Aviva Strip, use 4 times per day for monitoring insulin  dependant uncontrolled diabetes      blood-glucose meter kit Use as instructed 1 each 0    chlorthalidone  (HYGROTON ) 25 MG tablet Take 1 tablet (25 mg total) by mouth every morning. 100 tablet 3    diaper,brief,adult,disposable Misc 1 each by Miscellaneous route two (2) times a day. 20 each 2    dulaglutide (TRULICITY) 1.5 mg/0.5 mL PnIj Inject 0.5 mL (1.5 mg total) under the skin every seven (7) days.      DULoxetine  (CYMBALTA ) 20 MG capsule  (Patient not taking: Reported on 10/06/2023)      empagliflozin  (JARDIANCE ) 10 mg tablet Take 2 tablets (20 mg total) by mouth daily. 180 tablet 2    empty container Misc Use as directed to dispose of Stelara  syringes. 1 each 2    flash glucose sensor (FREESTYLE LIBRE 14 DAY SENSOR) kit Apply new sensor every 14 days and use as directed to monitor and check blood sugars 2 each 12    fluocinolone  acetonide oil 0.01 % Drop Apply 2 -4 drops to ears twice a day 20 mL 4    gabapentin  (NEURONTIN ) 800 MG tablet Take 1 tablet (800 mg total) by mouth Three (3) times a day. 270 tablet 2    hydrocortisone  2.5 % ointment APPLY TO PSORIASIS IN EARS UP TO TWICE DAILY AS NEEDED UNTIL SMOOTH 28.35 g 0    insulin  aspart (NOVOLOG  FLEXPEN U-100 INSULIN ) 100 unit/mL (3 mL) injection pen Inject 0.1 mL (10 Units total) under the skin Three (3) times a day before meals. 27 mL 3    insulin  detemir U-100 (LEVEMIR  FLEXPEN) 100 unit/mL (3 mL) injection pen Inject 0.52 mL (52 Units total) under the skin daily. (Patient not taking: Reported on 07/15/2023) 45 mL 0    NIFEdipine  (PROCARDIA  XL) 30 MG 24 hr tablet Take 1 tablet (30 mg total) by mouth daily. 100 tablet 3    nortriptyline (PAMELOR) 10 MG capsule Take 1 capsule (10 mg total) by mouth nightly.      polyethylene glycol (GOLYTELY) 236-22.74-6.74 gram solution Take by mouth as directed per [facility] GI prep instructions, for split bowel prep. 4000 mL 0    testosterone  20.25 mg/1.25 gram (1.62 %) gel pump PLACE 2 SPRAYS ON THE SKIN DAILY 3.65 g 3    ustekinumab -kfce (YESINTEK ) 45 mg/0.5 mL Syrg Inject the contents of 1 syringe (  45 mg total) under the skin every 12 weeks. 2 mL 2     No current facility-administered medications for this visit.   [4]   Family History  Problem Relation Age of Onset    Diabetes Mother     Diabetes type II Mother     Ovarian cancer Mother     Diabetes Father     Diabetes type II Father     Diabetes type II Sister     Diabetes type II Brother     Diabetes type II Maternal Grandmother     Diabetes type II Maternal Grandfather     Diabetes type II Paternal Grandmother     Diabetes type II Paternal Grandfather     Melanoma Neg Hx     Basal cell carcinoma Neg Hx     Squamous cell carcinoma Neg Hx

## 2023-10-20 NOTE — Unmapped (Signed)
 Call to patient/caregiver with questions regarding day of surgery medications.    Patient/caregiver advised to take the following medication(s) day of surgery per anesthesia guidelines:    NIFEDIPINE   LIPITOR   OMEPRAZOLE   GABAPENTIN     Hold Jardiance  3 days before surgery      Patient/caregiver verbalized understanding, no further questions at this time.

## 2023-10-20 NOTE — Unmapped (Signed)
 Phone call to patient regarding use of GLP-1 medication.    Patient confirmed that they are actively taking Dulaglutide (Trulicity) or have taken a GLP-1 medication in the last 30 days.    Patient advised that beginning at 9 am the day before surgery they should begin a clear liquid diet consisting of water, apple or white grape juice, sodas, sports drinks (except red/purple), or black coffee.  Patient aware that they may have these liquids until 2 hours prior to the arrival time.    Educated to:   avoid broths, jello, and dairy  clear liquids are encouraged until 2 hours prior to arrival time  continue taking their GLP-1 medication as prescribed    Patient was educated that this is to reduce the chance of a preventable complication and increase safety surrounding perioperative use of these medications.    All questions were answered and patient verbalized understanding.

## 2023-10-20 NOTE — Unmapped (Signed)
 GLP1 Call attempted. LM req to call PPS back.

## 2023-10-21 NOTE — Unmapped (Signed)
 Returning call to patient from left message.   Reviewed GLP-1 instructions, DOS medications and Jardiance  hold date. Pt verbalizing understanding.

## 2023-10-22 DIAGNOSIS — E1121 Type 2 diabetes mellitus with diabetic nephropathy: Principal | ICD-10-CM

## 2023-10-22 DIAGNOSIS — I10 Essential (primary) hypertension: Principal | ICD-10-CM

## 2023-10-22 DIAGNOSIS — N182 Chronic kidney disease, stage 2 (mild): Principal | ICD-10-CM

## 2023-10-22 DIAGNOSIS — R809 Proteinuria, unspecified: Principal | ICD-10-CM

## 2023-10-22 NOTE — Unmapped (Signed)
 Referring Provider: [name], FNP     PCP:  [name], FNP    10/22/2023    Chief Complaint: follow-up for proteinuria and CKD    Background:  [name] is a 63 y.o. male who is followed here for CKD. He was referred here for evaluation of proteinuria and first seen in 10/2020.     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 His diabetes was poorly controlled for many years, with a documented a1c on his chart of 14%. He notes that this was fairly typical for him during that time period and many times he would max out his glucometer. His glucose is much better controlled now.     FH is positive for a cousin who required kidney transplant x2. The cause of renal disease is unknown. FH is also positive for DM2.      Was in ED 07/25/2021 for cellulitis to his right great toe. His SBP was noted to be 200 and his lisinopril  was increased to 40mg  and he was started on nifedipine  30mg . He is not taking the nifedipine  at this time as it made him feel strange with some parathesias to his neck. He was also placed on hydrochlorothiazide  12.5mg .      Had a Holter monitor placed 11/2022 for syncope.     HPI: Livio Kloos returns today for follow-up.     Had colonoscopy and upper endoscopy recently. Surgery tomorrow for his finger.     Otherwise doing ok, no complaints today. Remains off lisinopril .     PAST MEDICAL HISTORY:  Past Medical History:   Diagnosis Date    Cellulitis of left foot 07/24/2021    Diabetes mellitus        Gastroparesis due to DM    09/09/2019    Hypertension     Psoriasis     Substance abuse (CMS-HCC)        ALLERGIES  Patient has no known allergies.    SOCIAL HISTORY  Social History     Socioeconomic History    Marital status: Single   Tobacco Use    Smoking status: Never     Passive exposure: Never Smokeless tobacco: Never   Vaping Use    Vaping status: Never Used   Substance and Sexual Activity    Alcohol  use: Yes     Comment: 2 beers/day average    Drug use: Yes     Frequency: 7.0 times per week     Types: Marijuana     Comment: h/o cocaine abuse; daily marijuana    Sexual activity: Not Currently   Other Topics Concern    Do you use sunscreen? Yes    Tanning bed use? No    Are you easily burned? No    Excessive sun exposure? Yes    Blistering sunburns? No   Social History Narrative    Hx of cocaine abuse.  Denies tobacco.     Social Drivers of Psychologist, prison and probation services Strain: Low Risk  (07/24/2022)    Overall Financial Resource Strain (CARDIA)     Difficulty of Paying Living Expenses: Not hard at all   Food Insecurity: No Food Insecurity (06/03/2023)    Hunger Vital Sign     Worried About Running Out of Food in the Last Year:  Never true     Ran Out of Food in the Last Year: Never true   Transportation Needs: No Transportation Needs (06/03/2023)    PRAPARE - Therapist, art (Medical): No     Lack of Transportation (Non-Medical): No   Physical Activity: Insufficiently Active (11/30/2020)    Exercise Vital Sign     Days of Exercise per Week: 3 days     Minutes of Exercise per Session: 40 min   Stress: No Stress Concern Present (11/30/2020)    [name] of Occupational Health - Occupational Stress Questionnaire     Feeling of Stress : Only a little   Social Connections: Unknown (11/30/2020)    Social Connection and Isolation Panel     Frequency of Communication with Friends and Family: More than three times a week     Frequency of Social Gatherings with Friends and Family: More than three times a week     Attends Religious Services: Patient declined     Database administrator or Organizations: Patient declined     Attends Banker Meetings: Patient declined     Marital Status: Divorced   Housing: Low Risk  (06/03/2023)    Housing     Within the past 12 months, have you ever stayed: outside, in a car, in a tent, in an overnight shelter, or temporarily in someone else's home (i.e. couch-surfing)?: No     Are you worried about losing your housing?: No         FAMILY HISTORY  Family History   Problem Relation Age of Onset    Diabetes Mother     Diabetes type II Mother     Ovarian cancer Mother     Diabetes Father     Diabetes type II Father     Diabetes type II Sister     Diabetes type II Brother     Diabetes type II Maternal Grandmother     Diabetes type II Maternal Grandfather     Diabetes type II Paternal Grandmother     Diabetes type II Paternal Grandfather     Melanoma Neg Hx     Basal cell carcinoma Neg Hx     Squamous cell carcinoma Neg Hx         MEDICATIONS:  Current Outpatient Medications   Medication Sig Dispense Refill    alcohol  swabs  (ALCOHOL  PADS) PadM Apply 1 swab topically four (4) times a day. 400 each 1    atorvastatin  (LIPITOR ) 40 MG tablet TAKE 1 TABLET(40 MG) BY MOUTH DAILY 90 tablet 2    betamethasone  valerate (VALISONE ) 0.1 % ointment Apply to thick areas of psoriasis twice daily as needed until smooth. Avoid face and skin folds. 45 g 8    blood sugar diagnostic Strp Accu Check Aviva Strip, use 4 times per day for monitoring insulin  dependant uncontrolled diabetes      blood-glucose meter kit Use as instructed 1 each 0    chlorthalidone  (HYGROTON ) 25 MG tablet Take 1 tablet (25 mg total) by mouth every morning. 100 tablet 3    diaper,brief,adult,disposable Misc 1 each by Miscellaneous route two (2) times a day. 20 each 2    dulaglutide (TRULICITY) 1.5 mg/0.5 mL PnIj Inject 0.5 mL (1.5 mg total) under the skin every seven (7) days.      empagliflozin  (JARDIANCE ) 10 mg tablet Take 2 tablets (20 mg total) by mouth daily. 180 tablet 2    empty container Misc  Use as directed to dispose of Stelara  syringes. 1 each 2    flash glucose sensor (FREESTYLE LIBRE 14 DAY SENSOR) kit Apply new sensor every 14 days and use as directed to monitor and check blood sugars 2 each 12    fluocinolone  acetonide oil 0.01 % Drop Apply 2 -4 drops to ears twice a day 20 mL 4    gabapentin  (NEURONTIN ) 800 MG tablet Take 1 tablet (800 mg total) by mouth Three (3) times a day. 270 tablet 2    hydrocortisone  2.5 % ointment APPLY TO PSORIASIS IN EARS UP TO TWICE DAILY AS NEEDED UNTIL SMOOTH 28.35 g 0    insulin  aspart (NOVOLOG  FLEXPEN U-100 INSULIN ) 100 unit/mL (3 mL) injection pen Inject 0.1 mL (10 Units total) under the skin Three (3) times a day before meals. 27 mL 3    NIFEdipine  (PROCARDIA  XL) 30 MG 24 hr tablet Take 1 tablet (30 mg total) by mouth daily. 100 tablet 3    nortriptyline (PAMELOR) 10 MG capsule Take 1 capsule (10 mg total) by mouth nightly.      omeprazole  (PRILOSEC) 20 MG capsule TAKE 1 CAPSULE(20 MG) BY MOUTH DAILY 90 capsule 1    polyethylene glycol (GOLYTELY) 236-22.74-6.74 gram solution Take by mouth as directed per [facility] GI prep instructions, for split bowel prep. 4000 mL 0    testosterone  20.25 mg/1.25 gram (1.62 %) gel pump PLACE 2 SPRAYS ON THE SKIN DAILY 3.65 g 3    ustekinumab -kfce (YESINTEK ) 45 mg/0.5 mL Syrg Inject the contents of 1 syringe (45 mg total) under the skin every 12 weeks. 2 mL 2    insulin  detemir U-100 (LEVEMIR  FLEXPEN) 100 unit/mL (3 mL) injection pen Inject 0.52 mL (52 Units total) under the skin daily. (Patient not taking: Reported on 10/22/2023) 45 mL 0     No current facility-administered medications for this visit.       PHYSICAL EXAM:     Vitals:    10/22/23 1115   BP: 165/86   Pulse: 84   Temp: 36.1 ??C (96.9 ??F)     CONSTITUTIONAL: Alert, well appearing, no distress  HEENT: Moist mucous membranes, OP clear. Extra ocular movements intact. Sclerae anicteric.  CARDIOVASCULAR: RRR  PULM: Clear to auscultation bilaterally  EXTREMITIES: Trace lower extremity edema bilaterally, L>R (reports L always greater)  NEUROLOGIC: No gross focal motor deficits  PSYCH: alert and oriented x 3    MEDICAL DECISION MAKING    Results for orders placed or performed in visit on 10/15/23   Lipid Panel   Result Value Ref Range    Cholesterol, Total 165 <200 mg/dL    Cholesterol, HDL 46 >40 mg/dL    Cholesterol, LDL, Calculated 99 <100 mg/dL    Cholesterol, Non-HDL, Calculated 119 <130 mg/dL    Triglycerides 871 <849 mg/dL    Fasting Unknown    Ferritin   Result Value Ref Range    Ferritin 27.5 10.5 - 307.3 ng/mL   Iron and TIBC   Result Value Ref Range    Iron 44 (L) 65 - 175 ug/dL    TIBC 676 749 - 574 ug/dL    Iron Saturation (%) 14 (L) 20 - 55 %   Albumin/creatinine urine ratio   Result Value Ref Range    Creat U 51.9 Undefined mg/dL    Albumin Quantitative, Urine 24.3 Undefined mg/dL    Albumin/Creatinine Ratio 468.2 (H) 0.0 - 30.0 ug/mg   Renal Function Panel   Result Value Ref Range  Sodium 139 135 - 145 mmol/L    Potassium 4.8 3.5 - 5.1 mmol/L    Chloride 99 98 - 107 mmol/L    CO2 30.0 20.0 - 31.0 mmol/L    Anion Gap 10 5 - 14 mmol/L    BUN 34 (H) 9 - 23 mg/dL    Creatinine 8.94 9.26 - 1.18 mg/dL    BUN/Creatinine Ratio 32     eGFR CKD-EPI (2021) Male 81 >=60 mL/min/1.64m2    Glucose 209 (H) 70 - 179 mg/dL    Calcium 9.8 8.7 - 89.5 mg/dL    Phosphorus 3.4 2.4 - 5.1 mg/dL    Albumin 3.9 3.4 - 5.0 g/dL   CBC w/ Differential   Result Value Ref Range    WBC 8.3 3.6 - 11.2 10*9/L    RBC 4.51 4.26 - 5.60 10*12/L    HGB 13.5 12.9 - 16.5 g/dL    HCT 60.0 60.9 - 51.9 %    MCV 88.4 77.6 - 95.7 fL    MCH 29.8 25.9 - 32.4 pg    MCHC 33.7 32.0 - 36.0 g/dL    RDW 86.7 87.7 - 84.7 %    MPV 12.7 (H) 6.8 - 10.7 fL    Platelet 164 150 - 450 10*9/L    Neutrophils % 58.3 %    Lymphocytes % 21.0 %    Monocytes % 12.4 %    Eosinophils % 6.8 %    Basophils % 1.5 %    Absolute Neutrophils 4.8 1.8 - 7.8 10*9/L    Absolute Lymphocytes 1.7 1.1 - 3.6 10*9/L    Absolute Monocytes 1.0 (H) 0.3 - 0.8 10*9/L    Absolute Eosinophils 0.6 (H) 0.0 - 0.5 10*9/L    Absolute Basophils 0.1 0.0 - 0.1 10*9/L   Morphology Review   Result Value Ref Range    Smear Review Comments See Comment (A) Undefined Creatinine   Date Value Ref Range Status   10/15/2023 1.05 0.73 - 1.18 mg/dL Final   96/89/7974 8.93 0.73 - 1.18 mg/dL Final   97/71/7974 8.86 0.73 - 1.18 mg/dL Final   97/81/7974 8.81 0.73 - 1.18 mg/dL Final   88/89/7975 8.88 0.73 - 1.18 mg/dL Final   87/69/7978 1.0 0.5 - 1.4 mg/dL Final   89/85/7978 0.9 0.5 - 1.4 mg/dL Final   98/79/7978 0.8 0.5 - 1.4 mg/dL Final   98/83/7978 1.1 0.5 - 1.4 mg/dL Final   87/78/7979 1.0 0.5 - 1.4 mg/dL Final        No results for input(s): NA, K, CL, BUN, CREATININE, GFR, GLU in the last 24 hours.    Invalid input(s): C02    IMAGING STUDIES:     ASSESSMENT/PLAN:    Mr.Eduin Heo is a 63 y.o. patient with a past medical history significant for DM, HTN, and CKD with albuminuria who is being seen in clinic.     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   Lab Results   Component Value Date    CREATININE 1.05 10/15/2023   - Cr stable/improved over the last year, though elevated compared to 2022-2023  - The patient was counseled on avoiding nephrotoxic agents including NSAIDs   - On atorvastatin  40mg  daily. LDL goal <70 given DM, CKD + albuminuria. Most Recent LDL: 99  LDL date: 10/15/2023   - UACR 468.2mcg/mg. ACEi/ARB - lisinopril  stopped in 06/2023 due to hyperkalemia (severe, multiple episodes in spite of thiazide).  Have not restarted. SGLT2i - on Jardiance  10mg  daily.     H/o hyperkalemia: was consistently hyperkalemic since 11/2022, has had hyperkalemia in the past (2021, 2023). Markedly elevated to 6.4 recently on 2/18 with PCP, started on thiazide at that time.   Lab Results   Component Value Date    K 4.8 10/15/2023   - On chlorthalidone  25mg  daily  - Discussed limiting dietary K and given printed handout at previous visit and reviewed with him.   - Consider potassium binder in the future to allow for ACEi/ARB use, though he has been reluctant to take more medications. Anemia:   Lab Results   Component Value Date    HGB 13.5 10/15/2023     Lab Results   Component Value Date    IRON 44 (L) 10/15/2023    TIBC 323 10/15/2023    FERRITIN 27.5 10/15/2023   - Iron studies: Tsat 14%, ferritin 27.5 - lower than prior. Had colonoscopy/EGD 10/06/23 - 2 sessile polyps which were retrieved, otherwise unremarkable. Not anemic. CTM but if ferritin lower or anemic would start oral iron.     HTN: Goal <130/80.   BP Readings from Last 3 Encounters:   10/22/23 165/86   10/06/23 132/82   07/15/23 131/85   - Current meds: chlorthalidone  25mg  daily, nifedipine  30mg  daily.   - On nifedipine  in the past - stopped due to hypotension/parasthesias (unclear). He was seen in 01/2023 by neurology for numbness/tingling/weakness/balance issues, and I see no mention of calcium channel blocker or concern about this. Cardiology note from 01/2023 indicates that he was on nifedipine  at that time and to continue. Also appears he was previously on metoprolol, unclear when this was stopped/why (appears he was on at the time of his cardiology visit in 01/2023).   - Lisinopril  stopped in 06/2023 - would not restart unless potassium significantly lower and would start low dose with very close monitoring if so. Have discussed that given his repeated severe hyperkalemia would only restart if starting concurrent potassium binder - at low dose and with close monitoring.   - He is reluctant to increase his BP meds today, thinks that he runs higher here than at home. Asked him to contact his insurance company to see if he can get a BP cuff (he says he is not able to afford one as he is on limited/fixed income).     DM: followed by endocrinology (Dr. Damian at [facility]). Complicated by retinopathy, neuropathy, nephropathy.   Lab Results   Component Value Date    A1C 6.9 (H) 03/03/2023   - A1C 7 in 05/2023  - UACR 468.2mcg/mg - macroalbuminuria present.   - Current medications: Jardiance  10mg  daily, Trulicity 1.5mg  weekly. Off insulin .   - H/o intolerance to metformin (diarrhea).     Mr.Marquise Archibeque will follow up in 6 months or sooner as needed  The patient will need renal function panel, UACR, CBC, iron panel, ferritin, lipid panel within 4 weeks prior to next visit.

## 2023-10-23 ENCOUNTER — Inpatient Hospital Stay: Admit: 2023-10-23 | Discharge: 2023-10-23 | Payer: Medicare (Managed Care)

## 2023-10-23 ENCOUNTER — Ambulatory Visit: Admit: 2023-10-23 | Discharge: 2023-10-23 | Payer: Medicare (Managed Care)

## 2023-10-23 ENCOUNTER — Encounter
Admit: 2023-10-23 | Discharge: 2023-10-23 | Payer: Medicare (Managed Care) | Attending: Anesthesiology | Primary: Anesthesiology

## 2023-10-23 DIAGNOSIS — M720 Palmar fascial fibromatosis [Dupuytren]: Principal | ICD-10-CM

## 2023-10-23 MED ORDER — OXYCODONE 5 MG TABLET
ORAL_TABLET | ORAL | 0 refills | 2.00000 days | Status: CP | PRN
Start: 2023-10-23 — End: 2023-10-28

## 2023-10-23 MED ADMIN — acetaminophen (TYLENOL) tablet 1,000 mg: 1000 mg | ORAL | @ 10:00:00 | Stop: 2023-10-23

## 2023-10-23 MED ADMIN — ceFAZolin (ANCEF) IVPB 2 g in 50 ml dextrose (premix): 2 g | INTRAVENOUS | @ 12:00:00 | Stop: 2023-10-23

## 2023-10-23 MED ADMIN — propofol (DIPRIVAN) infusion 10 mg/mL: INTRAVENOUS | @ 12:00:00 | Stop: 2023-10-23

## 2023-10-23 MED ADMIN — bupivacaine (PF) (MARCAINE) 0.5 % (5 mg/mL) 5 mL, lidocaine (XYLOCAINE) 10 mg/mL (1 %) 5 mL: @ 12:00:00 | Stop: 2023-10-23

## 2023-10-23 MED ADMIN — oxyCODONE (ROXICODONE) immediate release tablet 5 mg: 5 mg | ORAL | @ 12:00:00 | Stop: 2023-10-23

## 2023-10-23 MED ADMIN — lidocaine (PF) (XYLOCAINE-MPF) 20 mg/mL (2 %) injection: INTRAVENOUS | @ 12:00:00 | Stop: 2023-10-23

## 2023-10-23 MED ADMIN — lactated Ringers infusion: 10 mL/h | INTRAVENOUS | @ 11:00:00 | Stop: 2023-10-23

## 2023-10-23 MED ADMIN — midazolam (VERSED) injection: INTRAVENOUS | @ 11:00:00 | Stop: 2023-10-23

## 2023-10-23 MED ADMIN — Propofol (DIPRIVAN) injection: INTRAVENOUS | @ 12:00:00 | Stop: 2023-10-23

## 2023-10-23 MED ADMIN — fentaNYL (PF) (SUBLIMAZE) injection 25 mcg: 25 ug | INTRAVENOUS | @ 12:00:00 | Stop: 2023-10-23

## 2023-10-23 MED ADMIN — ketorolac (TORADOL) injection: INTRAVENOUS | @ 12:00:00 | Stop: 2023-10-23

## 2023-10-23 MED ADMIN — ondansetron (ZOFRAN) injection: INTRAVENOUS | @ 12:00:00 | Stop: 2023-10-23

## 2023-10-23 NOTE — Unmapped (Addendum)
 1000 mg of acetaminophen  (Tylenol ) was given at 630AM. Wait until at least 1230PM to take next dose.     A medication similar to ibuprofen (Motrin, Advil) was given at 8AM. Next dose wont be until 4PM as needed.

## 2023-10-23 NOTE — Unmapped (Signed)
 Orthopaedic Surgery Operative Note (CSN: [id number])  Date of Surgery: 10/23/2023  Admit Date: 10/23/2023  Attending Physician: Cordella Hickman, MD      Preoperative Diagnosis: Dupuytren's contracture [M72.0]    Postoperative Diagnosis: Dupuytren's contracture [M72.0]    Procedure:  1.  Application of digit widget external fixator to right small finger (CPT [id number])     Resident Physician(s): Will DeBrock, MD    Anesthesia: Monitor Anesthesia Care     Antibiotics: Ancef  2 grams IV preoperatively.    Tourniquet time: None    Estimated Blood Loss: Minimal     Complications: None     Specimens: None     Indications for Surgery:    Cory Bentley is a 63 y.o. male with history of Dupuytren's disease.  He has previously had surgery of the right small finger at an outside location and has had significant recurrence of the flexion contracture of the proximal interphalangeal joint.  Given the significant nature of the flexion contracture I opted to proceed with placement of a dynamic external fixator/digit widget to the right small finger prior to proceeding with any additional fasciectomy.   Risks benefits and alternatives to surgery were discussed.     Operative Findings:  See op note     Procedure:                   The patient was identified in the preoperative holding area where the surgical site was marked with indelible ink. The patient was taken to the OR where a procedural timeout was called and the above noted anesthesia was induced.  Preoperative antibiotics were dosed.  The patient's right upper extremity was prepped and draped in the usual sterile fashion.  Prior to the start of the surgical procedure a second preoperative timeout was called which included verifying the correct patient, the correct operative site, and correct procedure.  We then commenced with our operation.       The pin guide from the digit widget set was brought onto the field and positioned on the middle phalanx of the right small finger. Proper positioning was confirmed using intraoperative fluoroscopy.  The 2 provided pins were then drilled through the guide holes in the guide.  Care was taken to obtain bicortical purchase.  The guidewires were then removed one at the time and replaced with the threaded pins.  The pins were then cut and the guide was removed.  The sled was then positioned on the threaded pins and secured in place.   Final imaging revealed proper device positioning at the operative digit. The hand wrap was positioned on the hand and the digit widget arm was secured to the sled.  A medium tension band was applied.  At the end of the case all sponge instrument and needle counts were correct.  He was transferred to the postoperative care unit in stable condition.      Post-op Plan/Instructions:     The patient will be discharged home.  He will begin occupational therapy at his first postoperative visit.     Teaching Surgeon Attestation: Cordella Hickman MD was present, scrubbed and an active participant for the entire procedure.      Cordella CHRISTELLA Hickman, MD   Date: 10/23/2023  Time: 11:12 AM

## 2023-10-24 NOTE — Unmapped (Signed)
 Patient is postop day 1 from placement of digit widget to the right small finger.  Patient called due to dissatisfaction with his postoperative instructions and post operative care coordination for him to be able to take care of himself when he gets home.  Patient was angry and aggressive on the phone and required direction to calm down. Patient states that he is unable to place a bag over his hand and his leg and does not have anyone at home to help him. States that he is unable to perform his daily hygiene due to the digit widget. States that the Velcro of the digit widget continues to get stuck on the Velcro of the shoe and pulls on it as well.  He is concerned that the digit widget may no longer be in place.  We instructed the patient that he can send us  a photo of the digit widget or he can come to the emergency department to have it assessed.  Patient states that he is currently okay with how it looks and thinks that it may be okay.  States that the rubber bands are still in place.  He denies any issues related to the hand itself.  I instructed him to follow his postoperative instructions and do not take off the digit widget until his postoperative appointment.  If he showers, I instructed him to cover with a bag and do not let it get wet.  He expressed understanding of this.  He stated that if he has any other issues he will reach out.    Crystal Phlegm, MD  PGY-3, Plastic and Reconstructive Surgery

## 2023-10-24 NOTE — Unmapped (Signed)
 Date and time called: 10/24/2023 4:28 PM  Provider: [name]  Spoke to patient: no  Left message: yes  Patient to respond via: [phone]  Details of call: Received voicemail from patient on clinic line stating he had questions about the device on his hand. Attempted to speak with the patient but phone call was not answered and a voicemail was left. I will attempt to speak with the patient on Monday.

## 2023-10-29 NOTE — Unmapped (Signed)
 Date and time called: 10/29/2023 9:01 AM  Provider: [name]  Spoke to patient: yes  Left message: no  Patient to respond via: [phone]  Details of call: Received voicemail from patient requesting return call to discuss postop concerns.  Patient had surgery with Dr. Knoll on 7/10 for right small finger digit widget application, patient has been wearing a Band-Aid on his right ring finger due to rubbing of the device causing irritation, indentation.  Recommended patient to stop by the [facility] clinic today for visual inspection and to discuss other ways to manage his ring finger symptoms.  Patient stated he has his mother staying with him and he would prefer to not drive back and forth with her.  Discussed alternative ways to keep the area from rubbing, we will evaluate the digit widget at his first postop appointment.  Will follow-up with patient at the end of the week but patient can contact the office with any concerns prior to that.

## 2023-10-30 MED ORDER — OXYCODONE 5 MG TABLET
ORAL_TABLET | ORAL | 0 refills | 1.00000 days | Status: CP | PRN
Start: 2023-10-30 — End: ?

## 2023-11-01 NOTE — Unmapped (Unsigned)
 ORTHOPAEDIC RETURN CLINIC NOTE     ASSESSMENT:  Cory Bentley is a 63 y.o. male status post R SF digit widget application.   Date of surgery: 10/23/23    PLAN:  ***    INTERIM HISTORY:  ***    PHYSICAL EXAM:  General: Well developed, well nourished male in no apparent distress  Musculoskeletal: ***    IMAGING:  ***

## 2023-11-03 NOTE — Unmapped (Signed)
 [facility]  [address] [place], [place] [address]    [phone]    This note is to let you know that Cory Bentley Occupational Therapy Evaluation was cancelled by her provider.  Please contact me if you have any questions or concerns.    Thank you for this referral,     Signed: Arne LELON Close, OT  11/03/2023 1:01 PM

## 2023-11-04 NOTE — Unmapped (Unsigned)
 OUTPATIENT OCCUPATIONAL THERAPY    UPPER EXTREMITY EVALUATION    Patient Name: Cory Bentley  Date of Birth:07-22-1960  Date: 11/05/2023  Visit #: 1  Insurance Type: [employer] of Care Certification Dates:     No diagnosis found.  Reason for referral: OT evaluation and treat   Referring Provider: Cordella CHRISTELLA Hickman  Onset of Symptoms: sx 10/23/23  Per Referring Provider's note:   Post-op Plan/Instructions:     The patient will be discharged home.  He will begin occupational therapy at his first postoperative visit.    Communication preference: verbal, written, visual  Prognosis: fair due to motivation, health status     OT EVALUATION ASSESSMENT:   63 y.o. year old male with above diagnosis. Korde presents with digit widget on R small finger. He has the distal phalanx of the ring finger wrapped with coban secondary to digit widget cutting into tip of adjacent finger. 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 Patient requires skilled Occupational Therapy services for decreased range of motion, decreased strength, orthotic fit/management, impaired daily activities of living as appropriate.     Previous Level of Function: Pt was previously independent with all ADLs and IADLs.     CURRENT LEVEL OF FUNCTION  Social and Occupational: he had an infection in the R SF secondary to diabetes.  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  This patient has no co-morbidities affecting occupational performance.  A problem focused assessment was performed.  Please refer to Current level of function section for further details.     PLAN  Short Term Goals:  1. In 1 session, patient will perform home exercise program with need for cuing to max IND with ADLs and IADLs. (met)    Long Term Goals:   1. In 12 weeks, patient will perform upgraded home exercise program, to include progression to strengthening, independently to max IND with ADLs including bathing activities.  2. In 12 weeks, pt will demo functional fist to max IND with grasp and release of daily living tools such as those used for grooming activities.   3. In 12 weeks, patient will demo finger extension of 15 degrees to increase independence with I/ADLs.  4. In 12 weeks, patient will demonstrate an average grip strength that is 65% of the contralateral side (left) to max IND with I/ADLs, such as carrying/lifting heavy items around the home.  5. In 12 weeks, patient will self report =< 2/10 pain score with activities to max IND with I/ADLs, such as writing, lifting/carrying functional items.    OT  PLAN OF CARE:  Pt will participate in:  Self Care/Hometraining  Orthotic Fit/Management   Therapeutic Exercise  Therapeutic Activity   Neuromuscular Re-education  Ultrasound  Hot/Cold Pack  Electrical Stimulation  Iontophoresis  Orthotic/Prosthetic Measure and Fit   Joint Mobilization  Physical Performance Measure   Manual Therapy    Planned frequency and duration of treatment: every other week/ 12 weeks. Plan will be adjusted as necessary.     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               AROM (degrees) Date: 11/05/2023  Right Date: 11/05/2023  Left   Wrist     Extension/flexion     Radial/ulnar deviation     Forearm     Supination/pronation Elbow     Ext/flex     Composite flex to DPC   (cm lack) Full     Index     Middle     Ring     Small     Digit Extension 1 cm from [facility] to fingernail of SF    Thumb  Opposition        Digit ROM Index finger Long finger Ring finger Small finger   MCP ext/flex       PIP ext/flex       DIP ext/flex       TAM total         Grip and Pinch Strength Date:  Right Date:  Left   Gross Grip Strength (lbs)  Position 2 Male - age 38-64 R: 51-137 L: 27-116    Trial 1     Trial 2     Trial 3          Average         Other notes: 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 This is due to several factors including but not limited to hormonal fluctuations caused by natural aging, gender affirming care, the presence of underlying or undetected medial conditions, and the historically poor representation of several populations throughout normative testing. Therefore functional deficits should be prioritized when considering deficits or impairments.       Wound/Incision(s) or Scar: digit widget present    Edema: min    Educated pt on the need to perform active range of motion to allow adequate venous return and prevent stiffness.     TREATMENT:    Self Care/home training (10 minutes):  Therapist issued HEP with patient demonstration (see below) with handouts provided to the patient.   Educated pt on weightbearing precautions and healing time-line.    Home Program:   Apply low to moderate heat 10 min prior to exercises for improved tissue extensibility  Initiated home training  Access Code: [email or web address]  URL: [email or web address]  Date: 11/05/2023  Prepared by: Consepcion Cleaves    Program Notes  Do not do exercises if it causes pain greater than 3/10 (discomfort).    Exercises  - Seated Finger Composite Flexion Extension  - 4 x daily - 7 x weekly - 1 sets - 10 reps - 5 seconds hold    I reviewed the no-show/attendance policy with the patient and caregiver(s). The family is aware that they must call to cancel appointments more than 24 hours in advance. They are also aware that if they late cancel or no-show three times, we reserve the right to cancel their remaining  appointments. This policy is in place to allow us  to best serve the needs of our caseload.    Treatment Rendered:   Self Care/Home Training: 10 min    Total Evaluation time:  15 minutes  Total Treatment Time:  10 minutes  Total Session Time:   25 minutes     Patient Education:  Topics: home program, disease process  Education Provided to: patient  Education Type: education, demonstration, literature  Response to education/teachback: verbal understanding received, return demonstration    PAST MEDICAL HISTORY:  Reviewed   Past Medical History[1]    Past Surgical History: Reviewed  Past Surgical History[2]    Allergies: Reviewed  Patient has no known allergies.    Medications: Reviewed  Current Medications[3]    I attest that I have reviewed the above information.  SignedBETHA CONSEPCION CLEAVES, OT  11/05/2023 8:43 AM           [1]   Past Medical History:  Diagnosis Date    Cellulitis of left foot 07/24/2021    Diabetes mellitus        Gastroparesis due to DM    09/09/2019    Hypertension     Psoriasis     Substance abuse (CMS-HCC)    [2]   Past Surgical History:  Procedure Laterality Date    PR ANESTH,CLEFT LIP REPAIR Right 04/20/2018    Procedure: DEBRIDEMENT; SKIN, SUBCUTANEOUS TISSUE, & MUSCLE HAND and FINGER wounds;  Surgeon: Logan Amaryllis Aliment, MD;  Location: MAIN OR [place];  Service: Plastics    PR APPLICATION UNIPLANE EXTERNAL FIXATION SYSTEM Right 10/23/2023    Procedure: UPPER EXTREMITY -APPLICATION OF A UNIPLANE (PINS OR WIRES IN 1 PLANE), UNILATERAL, EXTERNAL FIXATION SYSTEM;  Surgeon: Bonifacio Cordella HERO, MD;  Location: OR [facility] [place];  Service: Orthopedics    PR APPLY FOREARM SPLINT,STATIC Right 05/05/2018 Procedure: application right short arm ulnar gutter splint;  Surgeon: Logan Amaryllis Aliment, MD;  Location: MAIN OR [place];  Service: Plastics    PR APPLY FOREARM SPLINT,STATIC Right 05/11/2018    Procedure: Applic Short Arm Splint; Static;  Surgeon: Logan Amaryllis Aliment, MD;  Location: MAIN OR [place];  Service: Plastics    PR COLSC FLX W/RMVL OF TUMOR POLYP LESION SNARE TQ Left 10/06/2023    Procedure: COLONOSCOPY FLEX; W/REMOV TUMOR/LES BY SNARE;  Surgeon: Lucian Mikle Pfeiffer, MD;  Location: GI PROCEDURES [facility];  Service: Gastroenterology    PR DEBRIDEMENT MUSCLE &/FASCIA 1ST 20 SQ CM/< Right 04/27/2018    Procedure: debridement right palmar wound and dorsal right small finger wound;  Surgeon: Logan Amaryllis Aliment, MD;  Location: MAIN OR [place];  Service: Plastics    PR FTH/GF FR W/DIR CLSR F/C/C/M/N/AX/G/H/F 20SQCM/< Right 05/11/2018    Procedure: Full thickness skin graft to right palmar hand and right small finger wounds;  Surgeon: Logan Amaryllis Aliment, MD;  Location: MAIN OR [place];  Service: Plastics    PR NEGATIVE PRESSURE WOUND THERAPY DME <= 50 SQ CM Right 04/22/2018    Procedure: Right Hand Wound Vac Change;  Surgeon: Barnetta Everts, MD;  Location: MAIN OR [place];  Service: Plastics    PR SUB GRFT F/S/N/H/F/G/M/D /<100SCM /<1ST 25 SCM Right 05/05/2018    Procedure: application allograft human skin to right hand and right small finger wounds;  Surgeon: Logan Amaryllis Aliment, MD;  Location: MAIN OR ;  Service: Plastics    PR UPPER GI ENDOSCOPY,DIAGNOSIS Left 10/06/2023    Procedure: UGI ENDO, INCLUDE ESOPHAGUS, STOMACH, & DUODENUM &/OR JEJUNUM; DX W/WO COLLECTION  SPECIMN, BY BRUSH OR WASH;  Surgeon: Lucian Mikle Pfeiffer, MD;  Location: [facility];  Service: Gastroenterology    PR WOUND PREP, PED, TRK/ARM/LG 1ST 100 CM Right 05/05/2018    Procedure: Debridement right hand and small finger wounds in preparation for skin grafting;  Surgeon: Logan Amaryllis Aliment, MD;  Location: MAIN OR [facility];  Service: Plastics PR WOUND PREP, PED, TRK/ARM/LG 1ST 100 CM Right 05/11/2018    Procedure: Surg Preparation/Creation Of Recipient Site By Excision Of Open Wound/Burn Eschar/Scar, Or Incisional Release Of Scar Contracture, Trunk/Arms/Legs; 1st 100 Sq Cm Or 1% Body Area Of Infants & Children;  Surgeon: Eagen Gene Deune, MD;  Location: MAIN OR [place];  Service: Plastics    Right toe amputation     [3]   Current Outpatient Medications:     alcohol  swabs  (ALCOHOL  PADS) PadM, Apply 1 swab topically four (4) times a day., Disp: 400 each, Rfl: 1    atorvastatin  (LIPITOR ) 40 MG tablet, TAKE 1 TABLET(40 MG) BY MOUTH DAILY, Disp: 90 tablet, Rfl: 2    betamethasone  valerate (VALISONE ) 0.1 % ointment, Apply to thick areas of psoriasis twice daily as needed until smooth. Avoid face and skin folds., Disp: 45 g, Rfl: 8    blood sugar diagnostic Strp, Accu Check Aviva Strip, use 4 times per day for monitoring insulin  dependant uncontrolled diabetes, Disp: , Rfl:     blood-glucose meter kit, Use as instructed, Disp: 1 each, Rfl: 0    chlorthalidone  (HYGROTON ) 25 MG tablet, Take 1 tablet (25 mg total) by mouth every morning., Disp: 100 tablet, Rfl: 3    diaper,brief,adult,disposable Misc, 1 each by Miscellaneous route two (2) times a day., Disp: 20 each, Rfl: 2    dulaglutide (TRULICITY) 1.5 mg/0.5 mL PnIj, Inject 0.5 mL (1.5 mg total) under the skin every seven (7) days., Disp: , Rfl:     empagliflozin  (JARDIANCE ) 10 mg tablet, Take 2 tablets (20 mg total) by mouth daily., Disp: 180 tablet, Rfl: 2    empty container Misc, Use as directed to dispose of Stelara  syringes., Disp: 1 each, Rfl: 2    flash glucose sensor (FREESTYLE LIBRE 14 DAY SENSOR) kit, Apply new sensor every 14 days and use as directed to monitor and check blood sugars, Disp: 2 each, Rfl: 12    fluocinolone  acetonide oil 0.01 % Drop, Apply 2 -4 drops to ears twice a day, Disp: 20 mL, Rfl: 4    gabapentin  (NEURONTIN ) 800 MG tablet, Take 1 tablet (800 mg total) by mouth Three (3) times a day., Disp: 270 tablet, Rfl: 2    hydrocortisone  2.5 % ointment, APPLY TO PSORIASIS IN EARS UP TO TWICE DAILY AS NEEDED UNTIL SMOOTH, Disp: 28.35 g, Rfl: 0    insulin  aspart (NOVOLOG  FLEXPEN U-100 INSULIN ) 100 unit/mL (3 mL) injection pen, Inject 0.1 mL (10 Units total) under the skin Three (3) times a day before meals., Disp: 27 mL, Rfl: 3    insulin  detemir U-100 (LEVEMIR  FLEXPEN) 100 unit/mL (3 mL) injection pen, Inject 0.52 mL (52 Units total) under the skin daily. (Patient not taking: Reported on 10/22/2023), Disp: 45 mL, Rfl: 0    NIFEdipine  (PROCARDIA  XL) 30 MG 24 hr tablet, Take 1 tablet (30 mg total) by mouth daily., Disp: 100 tablet, Rfl: 3    nortriptyline (PAMELOR) 10 MG capsule, Take 1 capsule (10 mg total) by mouth nightly., Disp: , Rfl:     omeprazole  (PRILOSEC) 20 MG capsule, TAKE 1 CAPSULE(20 MG) BY MOUTH DAILY,  Disp: 90 capsule, Rfl: 1    oxyCODONE  (ROXICODONE ) 5 MG immediate release tablet, Take 1 tablet (5 mg total) by mouth every four (4) hours as needed for pain for up to 5 doses., Disp: 5 tablet, Rfl: 0    polyethylene glycol (GOLYTELY) 236-22.74-6.74 gram solution, Take by mouth as directed per [facility] GI prep instructions, for split bowel prep., Disp: 4000 mL, Rfl: 0    testosterone  20.25 mg/1.25 gram (1.62 %) gel pump, PLACE 2 SPRAYS ON THE SKIN DAILY, Disp: 3.65 g, Rfl: 3    ustekinumab -kfce (YESINTEK ) 45 mg/0.5 mL Syrg, Inject the contents of 1 syringe (45 mg total) under the skin every 12 weeks., Disp: 2 mL, Rfl: 2 omeprazole  (PRILOSEC) 20 MG capsule, TAKE 1 CAPSULE(20 MG) BY MOUTH DAILY, Disp: 90 capsule, Rfl: 1    oxyCODONE  (ROXICODONE ) 5 MG immediate release tablet, Take 1 tablet (5 mg total) by mouth every four (4) hours as needed for pain for up to 5 doses., Disp: 5 tablet, Rfl: 0    polyethylene glycol (GOLYTELY) 236-22.74-6.74 gram solution, Take by mouth as directed per [facility] GI prep instructions, for split bowel prep., Disp: 4000 mL, Rfl: 0    testosterone  20.25 mg/1.25 gram (1.62 %) gel pump, PLACE 2 SPRAYS ON THE SKIN DAILY, Disp: 3.65 g, Rfl: 3    ustekinumab -kfce (YESINTEK ) 45 mg/0.5 mL Syrg, Inject the contents of 1 syringe (45 mg total) under the skin every 12 weeks., Disp: 2 mL, Rfl: 2

## 2023-11-05 ENCOUNTER — Ambulatory Visit
Admit: 2023-11-05 | Discharge: 2023-11-06 | Payer: Medicare (Managed Care) | Attending: Plastic and Reconstructive Surgery | Primary: Plastic and Reconstructive Surgery

## 2023-11-05 ENCOUNTER — Ambulatory Visit: Admit: 2023-11-05 | Payer: Medicare (Managed Care)

## 2023-11-05 NOTE — Unmapped (Signed)
 ORTHOPAEDIC RETURN CLINIC NOTE     ASSESSMENT:  Cory Bentley is a 63 y.o. male status post R SF digit widget application. Doing well.  Date of surgery: 10/23/23    PLAN:  -Switched to heavy bands. Re-discussed application of bands  -Coban dressing to RF rubbing on bands  -Continue OT  -Discussed that we do not prescribe narcotics long term for OT  -Pain management referral  -RTC 3 weeks    INTERIM HISTORY:  Has not yet changed out bands. Concerned that device is rubbing on his finger    PHYSICAL EXAM:  General: Well developed, well nourished male in no apparent distress  Musculoskeletal: digit widget in place, no erythema, no drainage    IMAGING:  none

## 2023-11-05 NOTE — Unmapped (Signed)
 Thank you for choosing [facility]!  We appreciate the opportunity to participate in your care.      If any questions or concerns arise after your visit, please do not hesitant to contact me by [facility] or by calling my clinical support, Irving Burton, at [phone].      Voicemail messages: Messages are checked between 8:00 am- 4:00 pm Monday- Friday.    MyChart messages: These messages are checked by the clinical staff during normal business hours 8:30 am-4:30 pm Monday-Friday every 24-48 hours and are for non-urgent, non-emergent concerns. You may be asked to return for a follow up visit if it is deemed your questions are best handled in the clinic setting. If you are not signed up on [facility], please call the [facility] at [phone] to receive help activating your account.    Please let me know if I can be of assistance with this or other orthopaedic issues in the future.

## 2023-11-12 DIAGNOSIS — R7989 Other specified abnormal findings of blood chemistry: Principal | ICD-10-CM

## 2023-11-12 MED ORDER — TESTOSTERONE 20.25 MG/1.25 GRAM PER PUMP ACT.(1.62 %) TRANSDERMAL GEL
0.00000 days
Start: 2023-11-12 — End: ?

## 2023-11-16 MED ORDER — TESTOSTERONE 20.25 MG/1.25 GRAM PER PUMP ACT.(1.62 %) TRANSDERMAL GEL
TRANSDERMAL | 1 refills | 0.00000 days | Status: CP
Start: 2023-11-16 — End: ?

## 2023-11-18 NOTE — Unmapped (Signed)
 OUTPATIENT OCCUPATIONAL THERAPY    UPPER EXTREMITY TREATMENT    Patient Name: Cory Bentley  Date of Birth:1960/12/02  Date: 11/26/2023  Visit #: 2  Insurance Type: [employer] of Care Certification Dates: 11/05/2023 - 01/28/2024    Encounter Diagnosis   Name Primary?    Dupuytren's contracture of right hand Yes     Reason for referral: OT evaluation and treat   Referring Provider: None Per Patient Pcp  Onset of Symptoms: sx 10/23/23  Per Referring Provider's note:   Post-op Plan/Instructions:     The patient will be discharged home.  He will begin occupational therapy at his first postoperative visit.    Communication preference: verbal, written, visual  Prognosis: fair due to motivation, health status     OT EVALUATION ASSESSMENT:   63 y.o. year old male with above diagnosis. Ahnaf presents with digit widget on R small finger. He has the distal phalanx of the ring finger wrapped with coban secondary to digit widget cutting into tip of adjacent finger. 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 Patient requires skilled Occupational Therapy services for decreased range of motion, decreased strength, orthotic fit/management, impaired daily activities of living as appropriate.     Previous Level of Function: Pt was previously independent with all ADLs and IADLs.     CURRENT LEVEL OF FUNCTION  Social and Occupational: he had an infection in the R SF secondary to diabetes.  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  This patient has no co-morbidities affecting occupational performance.  A problem focused assessment was performed. Please refer to Current level of function section for further details.     PLAN  Short Term Goals:  1. In 1 session, patient will perform home exercise program with need for cuing to max IND with ADLs and IADLs. (met)    Long Term Goals:   1. In 12 weeks, patient will perform upgraded home exercise program, to include progression to strengthening, independently to max IND with ADLs including bathing activities.  2. In 12 weeks, pt will demo functional fist to max IND with grasp and release of daily living tools such as those used for grooming activities.   3. In 12 weeks, patient will demo finger extension of 15 degrees to increase independence with I/ADLs.  4. In 12 weeks, patient will demonstrate an average grip strength that is 65% of the contralateral side (left) to max IND with I/ADLs, such as carrying/lifting heavy items around the home.  5. In 12 weeks, patient will self report =< 2/10 pain score with activities to max IND with I/ADLs, such as writing, lifting/carrying functional items.    OT  PLAN OF CARE:  Pt will participate in:  Self Care/Hometraining  Orthotic Fit/Management   Therapeutic Exercise  Therapeutic Activity   Neuromuscular Re-education  Ultrasound  Hot/Cold Pack  Electrical Stimulation  Iontophoresis  Orthotic/Prosthetic Measure and Fit   Joint Mobilization  Physical Performance Measure   Manual Therapy    Planned frequency and duration of treatment: every other week/ 12 weeks. Plan will be adjusted as necessary.     Patient in agreement with plan of care?: Yes  SUBJECTIVE:  Patient goals: use my hand normally, golf    Pain: 8/10 in the hands    Sensation: 50% neuropathy in hands and feet  CTS in both hands  Dupuytren's in both hands    Prior OT Service: yes    OBJECTIVE:  Precautions: digit widget in place  diabetes    Upper Extremity Function:  Shoulder:  WFL     Elbow:  WFL    Hand:   Pt is right hand dominant.               AROM (degrees) Date: 11/05/2023  Right Date: 11/05/2023  Left 11/26/23  RIght   Wrist      Extension/flexion      Radial/ulnar deviation      Forearm      Supination/pronation      Elbow      Ext/flex      Composite flex to [facility]   (cm lack) Full   full   Index      Middle      Ring      Small      Digit Extension 1 cm from [facility] to fingernail of SF     Thumb  Opposition         Digit ROM R Small finger 11/26/23  finger     MCP ext/flex -65/95      PIP ext/flex -64/87      DIP ext/flex -68/80      TAM total         Grip and Pinch Strength Date:  Right Date:  Left   Gross Grip Strength (lbs)  Position 2 Male - age 73-64 R: 51-137 L: 27-116    Trial 1     Trial 2     Trial 3          Average         Other notes: 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 This is due to several factors including but not limited to hormonal fluctuations caused by natural aging, gender affirming care, the presence of underlying or undetected medial conditions, and the historically poor representation of several populations throughout normative testing. Therefore functional deficits should be prioritized when considering deficits or impairments.       Wound/Incision(s) or Scar: digit widget present    Edema: min    Educated pt on the need to perform active range of motion to allow adequate venous return and prevent stiffness.     TREATMENT:  Daily report: that he has been feeling some pain, but has been doing his therapy  Pain: Pain present?: yes; Intensity: 5-8/10     Manual Therapy (30 minutes):  Patient provided with heat pack prior to start of exercises to increase blood flow, tissue extensibility, and joint mobility for 5 minutes (untimed) to prepare the affected area to start therapy session. Patient's skin was intact prior to and after application.  Therapist performed retrograde massage to help reduce edema in the R small finger. Therapist performed retrograde in the direction towards the body to simulate fluids away from the affected area and to help increase mobility. Patient was able to tolerate treatment.    Self Care/home training (10 minutes):  Therapist instructed to take off lever of the digit widget to perform shower/han  hygiene  Patient also provided with coban wrap and gloves when showering to cover the R SF    Post treatment Assessment: Patient continues to be able to be able to make a fist, but continues to demonstrate finger flexion contraction. Patient demonstrates maceration of the skin at the volar base of the small finger and was instructed to continue with skin checks. Patient demonstrates firm, dry skin of the ring finger, as it was reported it obtained an open wound from the digit widget.   Plan: continue with ROM    Home Program:   11/26/23 - Patient vended silicone silicone cap (size L)    Apply low to moderate heat 10 min prior to exercises for improved tissue extensibility  Initiated home training  Access Code: [email or web address]  URL: [email or web address]  Date: 11/05/2023  Prepared by: Consepcion Cleaves    Program Notes  Do not do exercises if it causes pain greater than 3/10 (discomfort).    Exercises  - Seated Finger Composite Flexion Extension  - 4 x daily - 7 x weekly - 1 sets - 10 reps - 5 seconds hold    I reviewed the no-show/attendance policy with the patient and caregiver(s). The family is aware that they must call to cancel appointments more than 24 hours in advance. They are also aware that if they late cancel or no-show three times, we reserve the right to cancel their remaining appointments. This policy is in place to allow us  to best serve the needs of our caseload.    Treatment Rendered:   Manual Therapy Techniques: 30 min  Self Care/Home Training: 10 min      Total Treatment Time:  40 minutes    Patient Education:  Topics: home program, disease process  Education Provided to: patient  Education Type: education, demonstration, literature  Response to education/teachback: verbal understanding received, return demonstration    PAST MEDICAL HISTORY:  Reviewed   Past Medical History[1]    Past Surgical History: Reviewed  Past Surgical History[2]    Allergies: Reviewed  Patient has no known allergies.    Medications: Reviewed  Current Medications[3]    I attest that I have reviewed the above information.  Signed: Tawni KANDICE Ruth, OT  11/26/2023 7:31 AM           [1]   Past Medical History:  Diagnosis Date    Cellulitis of left foot 07/24/2021    Diabetes mellitus         Gastroparesis due to DM     09/09/2019    Hypertension     Psoriasis     Substance abuse (CMS-HCC)    [2]   Past Surgical History:  Procedure Laterality Date    PR ANESTH,CLEFT LIP REPAIR Right 04/20/2018    Procedure: DEBRIDEMENT; SKIN, SUBCUTANEOUS TISSUE, & MUSCLE HAND and FINGER wounds;  Surgeon: Logan Amaryllis Aliment, MD;  Location: MAIN OR [place];  Service: Plastics    PR APPLICATION UNIPLANE EXTERNAL FIXATION SYSTEM Right 10/23/2023    Procedure: UPPER EXTREMITY -APPLICATION OF A UNIPLANE (PINS OR WIRES IN 1 PLANE), UNILATERAL, EXTERNAL FIXATION SYSTEM;  Surgeon: Bonifacio Cordella HERO, MD;  Location: OR [facility];  Service: Orthopedics    PR APPLY FOREARM SPLINT,STATIC Right 05/05/2018    Procedure: application right short arm ulnar gutter splint;  Surgeon: Logan Amaryllis Aliment, MD;  Location: MAIN OR [place];  Service: Plastics    PR APPLY FOREARM SPLINT,STATIC Right 05/11/2018    Procedure: Applic Short Arm Splint; Static;  Surgeon: Eagen  Amaryllis Aliment, MD;  Location: MAIN OR [place];  Service: Plastics    PR COLSC FLX W/RMVL OF TUMOR POLYP LESION SNARE TQ Left 10/06/2023    Procedure: COLONOSCOPY FLEX; W/REMOV TUMOR/LES BY SNARE;  Surgeon: Lucian Mikle Pfeiffer, MD;  Location: GI PROCEDURES [facility] [facility];  Service: Gastroenterology    PR DEBRIDEMENT MUSCLE &/FASCIA 1ST 20 SQ CM/< Right 04/27/2018    Procedure: debridement right palmar wound and dorsal right small finger wound;  Surgeon: Logan Amaryllis Aliment, MD;  Location: MAIN OR [facility];  Service: Plastics    PR FTH/GF FR W/DIR CLSR F/C/C/M/N/AX/G/H/F 20SQCM/< Right 05/11/2018    Procedure: Full thickness skin graft to right palmar hand and right small finger wounds;  Surgeon: Logan Amaryllis Aliment, MD;  Location: MAIN OR [place];  Service: Plastics    PR NEGATIVE PRESSURE WOUND THERAPY DME <= 50 SQ CM Right 04/22/2018    Procedure: Right Hand Wound Vac Change;  Surgeon: Barnetta Everts, MD;  Location: MAIN OR [place];  Service: Plastics    PR SUB GRFT F/S/N/H/F/G/M/D /<100SCM /<1ST 25 SCM Right 05/05/2018    Procedure: application allograft human skin to right hand and right small finger wounds;  Surgeon: Logan Amaryllis Aliment, MD;  Location: MAIN OR [place];  Service: Plastics    PR UPPER GI ENDOSCOPY,DIAGNOSIS Left 10/06/2023    Procedure: UGI ENDO, INCLUDE ESOPHAGUS, STOMACH, & DUODENUM &/OR JEJUNUM; DX W/WO COLLECTION SPECIMN, BY BRUSH OR WASH;  Surgeon: Lucian Mikle Pfeiffer, MD;  Location: [facility] PROCEDURES [place] [facility];  Service: Gastroenterology    PR WOUND PREP, PED, TRK/ARM/LG 1ST 100 CM Right 05/05/2018    Procedure: Debridement right hand and small finger wounds in preparation for skin grafting;  Surgeon: Logan Amaryllis Aliment, MD;  Location: MAIN OR [place];  Service: Plastics    PR WOUND PREP, PED, TRK/ARM/LG 1ST 100 CM Right 05/11/2018    Procedure: Surg Preparation/Creation Of Recipient Site By Excision Of Open Wound/Burn Eschar/Scar, Or Incisional Release Of Scar Contracture, Trunk/Arms/Legs; 1st 100 Sq Cm Or 1% Body Area Of Infants & Children;  Surgeon: Eagen Gene Deune, MD;  Location: MAIN OR [place];  Service: Plastics    Right toe amputation     [3]   Current Outpatient Medications:     alcohol  swabs  (ALCOHOL  PADS) PadM, Apply 1 swab topically four (4) times a day., Disp: 400 each, Rfl: 1    atorvastatin  (LIPITOR ) 40 MG tablet, TAKE 1 TABLET(40 MG) BY MOUTH DAILY, Disp: 90 tablet, Rfl: 2    betamethasone  valerate (VALISONE ) 0.1 % ointment, Apply to thick areas of psoriasis twice daily as needed until smooth. Avoid face and skin folds., Disp: 45 g, Rfl: 8    blood sugar diagnostic Strp, Accu Check Aviva Strip, use 4 times per day for monitoring insulin  dependant uncontrolled diabetes, Disp: , Rfl:     blood-glucose meter kit, Use as instructed, Disp: 1 each, Rfl: 0    chlorthalidone  (HYGROTON ) 25 MG tablet, Take 1 tablet (25 mg total) by mouth every morning., Disp: 100 tablet, Rfl: 3    diaper,brief,adult,disposable Misc, 1 each by Miscellaneous route two (2) times a day., Disp: 20 each, Rfl: 2    dulaglutide (TRULICITY) 1.5 mg/0.5 mL PnIj, Inject 0.5 mL (1.5 mg total) under the skin every seven (7) days., Disp: , Rfl:     empagliflozin  (JARDIANCE ) 10 mg tablet, Take 2 tablets (20 mg total) by mouth daily., Disp: 180 tablet, Rfl: 2    empty container Misc, Use as directed to dispose of Stelara  syringes., Disp:  1 each, Rfl: 2    flash glucose sensor (FREESTYLE LIBRE 14 DAY SENSOR) kit, Apply new sensor every 14 days and use as directed to monitor and check blood sugars, Disp: 2 each, Rfl: 12    fluocinolone  acetonide oil 0.01 % Drop, Apply 2 -4 drops to ears twice a day, Disp: 20 mL, Rfl: 4    gabapentin  (NEURONTIN ) 800 MG tablet, Take 1 tablet (800 mg total) by mouth Three (3) times a day., Disp: 270 tablet, Rfl: 2    hydrocortisone  2.5 % ointment, APPLY TO PSORIASIS IN EARS UP TO TWICE DAILY AS NEEDED UNTIL SMOOTH, Disp: 28.35 g, Rfl: 0    insulin  aspart (NOVOLOG  FLEXPEN U-100 INSULIN ) 100 unit/mL (3 mL) injection pen, Inject 0.1 mL (10 Units total) under the skin Three (3) times a day before meals., Disp: 27 mL, Rfl: 3    insulin  detemir U-100 (LEVEMIR  FLEXPEN) 100 unit/mL (3 mL) injection pen, Inject 0.52 mL (52 Units total) under the skin daily. (Patient not taking: Reported on 10/22/2023), Disp: 45 mL, Rfl: 0    NIFEdipine  (PROCARDIA  XL) 30 MG 24 hr tablet, Take 1 tablet (30 mg total) by mouth daily., Disp: 100 tablet, Rfl: 3    nortriptyline (PAMELOR) 10 MG capsule, Take 1 capsule (10 mg total) by mouth nightly., Disp: , Rfl:     omeprazole  (PRILOSEC) 20 MG capsule, TAKE 1 CAPSULE(20 MG) BY MOUTH DAILY, Disp: 90 capsule, Rfl: 1    oxyCODONE  (ROXICODONE ) 5 MG immediate release tablet, Take 1 tablet (5 mg total) by mouth every four (4) hours as needed for pain for up to 5 doses., Disp: 5 tablet, Rfl: 0    polyethylene glycol (GOLYTELY) 236-22.74-6.74 gram solution, Take by mouth as directed per [facility] GI prep instructions, for split bowel prep., Disp: 4000 mL, Rfl: 0    testosterone  20.25 mg/1.25 gram (1.62 %) gel pump, PLACE 2 SPRAYS ON SKIN DAILY, Disp: 75 g, Rfl: 1    ustekinumab -kfce (YESINTEK ) 45 mg/0.5 mL Syrg, Inject the contents of 1 syringe (45 mg total) under the skin every 12 weeks., Disp: 2 mL, Rfl: 2

## 2023-11-20 NOTE — Unmapped (Signed)
 ORTHOPAEDIC RETURN CLINIC NOTE     ASSESSMENT:  Cory Bentley is a 64 y.o. male status post R SF digit widget application.  There is some improvement in the proximal interphalangeal joint contracture however he is still contracted down approximately 80 degrees.  I would like to see some improvement in this prior to proceeding with any sort of intervention.  I have recommended adding an additional heavy band every other day.  Date of surgery: 10/23/23    PLAN:  -Discussed placement of 2 heavy bands.  -Continue OT   -RTC in 4 weeks     INTERIM HISTORY:  Pt switched to heavy bands. He has continued OT.     PHYSICAL EXAM:  General: Well developed, well nourished male in no apparent distress  Musculoskeletal: digit widget in place, no erythema, no drainage. Slight improvement in SF contracture however PIP is still flexed to approx 80 degrees    IMAGING:  None.

## 2023-11-26 ENCOUNTER — Ambulatory Visit
Admit: 2023-11-26 | Discharge: 2023-11-27 | Payer: Medicare (Managed Care) | Attending: Plastic and Reconstructive Surgery | Primary: Plastic and Reconstructive Surgery

## 2023-12-02 ENCOUNTER — Encounter: Admit: 2023-12-02 | Discharge: 2023-12-02 | Payer: Medicare (Managed Care)

## 2023-12-02 DIAGNOSIS — E11628 Type 2 diabetes mellitus with other skin complications: Principal | ICD-10-CM

## 2023-12-02 DIAGNOSIS — Z794 Long term (current) use of insulin: Principal | ICD-10-CM

## 2023-12-02 DIAGNOSIS — E1142 Type 2 diabetes mellitus with diabetic polyneuropathy: Principal | ICD-10-CM

## 2023-12-02 DIAGNOSIS — D509 Iron deficiency anemia, unspecified: Principal | ICD-10-CM

## 2023-12-02 LAB — VITAMIN B12: VITAMIN B-12: 1133 pg/mL — ABNORMAL HIGH (ref 211–911)

## 2023-12-02 LAB — FERRITIN: FERRITIN: 26.5 ng/mL (ref 10.5–307.3)

## 2023-12-02 LAB — HEMOGLOBIN A1C
ESTIMATED AVERAGE GLUCOSE: 148 mg/dL
HEMOGLOBIN A1C: 6.8 % — ABNORMAL HIGH (ref 4.8–5.6)

## 2023-12-02 LAB — IRON & TIBC
IRON SATURATION: 22 % (ref 20–55)
IRON: 65 ug/dL (ref 65–175)
TOTAL IRON BINDING CAPACITY: 299 ug/dL (ref 250–425)

## 2023-12-02 MED ORDER — GABAPENTIN 800 MG TABLET
ORAL_TABLET | Freq: Four times a day (QID) | ORAL | 2 refills | 68.00000 days
Start: 2023-12-02 — End: 2024-12-01

## 2023-12-08 NOTE — Unmapped (Signed)
 Assessment and Plan:     Cory Bentley was seen today for hypertension and diabetes.    Diagnoses and all orders for this visit:    Type 2 diabetes mellitus with other skin complication, with long-term current use of insulin       -     Hemoglobin A1c    Diabetic polyneuropathy associated with type 2 diabetes mellitus      -     gabapentin  (NEURONTIN ) 800 MG tablet; Take 1 tablet (800 mg total) by mouth four (4) times a day.  -     Vitamin B12 Level    Coronary artery stenosis     Iron deficiency anemia, unspecified iron deficiency anemia type  -     Iron & TIBC  -     Ferritin    Primary hypertension    BP today is 126/78  This is well controlled   Pt denies CP, SHOB, DOE, palpitations, dizziness, HA's, vision changes   Continue with chlorthalidone  25mg  daily and procardia  30mg  daily     Hyperlipidemia associated with type 2 diabetes mellitus       Pt is currently on atorvastatin  40mg  daily   He denies SE of statin therapy   Patient was encouraged to follow a dietary pattern of intake of lean proteins with supplemental vegetables, low-fat low carbohydrate food choices - The importance of portion control is emphasized - Patient is encouraged to participate in regular exercise activity, striving for 30 minutes of total exercise activity 4-5 times weekly   Will continue with this regimen      Gastroesophageal reflux disease, unspecified whether esophagitis present    Assessment/Plan:     Type 2 diabetes mellitus with diabetic neuropathy and right great toe foot ulcer  Type 2 diabetes mellitus with diabetic neuropathy, managed with Trulicity , Jardiance , and NovoLog . Blood sugar levels range from 95 to 145 mg/dL, with occasional spikes up to 245 mg/dL.   Right great toe foot ulcer present, healing but still open.   Neuropathy managed with gabapentin , currently taking 1600 mg twice daily, advised to adjust to 800 mg four times daily.  He is followed by Endo and Podiatry   Reviewed ADA recommendations for diet and exercise   - Continue Trulicity  1.5 mg weekly.  - Continue Jardiance  20 mg daily.  - Use NovoLog  as needed, approximately 8 units.  - Adjust gabapentin  to 800 mg four times daily.  - Change dressing every 2-3 days for right foot ulcer.  - Apply antibiotics and offload with horseshoe pad.  - Follow up with podiatrist in one week.    Carotid artery stenosis, left, 50%  Left carotid artery stenosis with 50% blockage noted on carotid US  08/2023.   He states he was not previously informed of this condition. Further evaluation with CTA may be indicated based on cardiologist's assessment.  - Discuss carotid artery stenosis with cardiologist during October follow-up.    Stroke  History of stroke, currently not using any assistive devices for mobility.    Status post digit widget placement, right hand  Status post digit widget placement in the right hand to aid in finger extension. Device involves two steel rods and a crank mechanism to gradually open the finger over 6-8 weeks.  - Use latex gloves to cover the device during showers.    Iron deficiency (suspected)  Suspected iron deficiency based on previous low iron levels. No current iron supplementation.  - Order iron level test.  - Order B12 level test.  -  Order A1c test.          I personally spent 30 minutes face-to-face and non-face-to-face in the care of this patient, which includes all pre, intra, and post visit time on the date of service.  All documented time was specific to the E/M visit and does not include any procedures that may have been performed.        Return in about 6 months (around 06/03/2024) for Recheck.    Subjective:     HPI: Cory Bentley is a 63 y.o. male here for   Chief Complaint   Patient presents with    Hypertension    Diabetes     Has appt with eye doctor coming up for diabetic eye exam and follows with podiatrist for his foot exam.   :    HPI      History of Present Illness    Cory Bentley is a 63 year old male with diabetes and peripheral vascular disease who presents for follow-up of his diabetes management    Glycemic control  - Monitors blood glucose at home with averages between 95 to 145 mg/dL  - Occasional hyperglycemia with blood glucose rising to 230-245 mg/dL, managed with NovoLog  as needed  - Current medications: Trulicity  1.5 mg weekly, Jardiance  20 mg daily, NovoLog  approximately 8 units as needed    Right foot ulcer  - Ulcer located under the right big toe  - Treated with horseshoe pad and dressing changes every 2-3 days  - Difficulty using recommended post-op shoe due to mobility limitations and home environment (two-story home, small dog)  - Son assists with dressing changes    Peripheral neuropathy  - Takes gabapentin  800 mg, two tablets in the morning and two at night, for neuropathic symptoms    Post-stroke sequelae  - History of stroke  - Initially required walker, then cane, now ambulates independently  - Difficulty with word retrieval, attributed to prior stroke    Carotid artery stenosis  - Previously identified 50% stenosis of the left carotid artery on ultrasound  - No symptoms related to carotid stenosis    Hand device use  - Currently using a 'digit widget' device with two steel rods in the bone to assist with finger extension  - Changes bands every other day  - Covers device with a latex glove while showering to prevent moisture exposure    Dyslipidemia  - Takes atorvastatin  40 mg daily    Iron deficiency  - History of low iron levels  - Takes a multivitamin but not a specific iron supplement           ROS:   Review of Systems   Constitutional:  Negative for activity change, appetite change, fatigue and unexpected weight change.   Respiratory:  Negative for chest tightness and shortness of breath.    Cardiovascular:  Negative for chest pain and palpitations.   Gastrointestinal:  Negative for abdominal pain, diarrhea, nausea and vomiting.   Neurological:  Negative for dizziness, light-headedness and headaches. Psychiatric/Behavioral:  Negative for sleep disturbance. The patient is not nervous/anxious.         Review of systems negative unless otherwise noted as per HPI    Objective:     Visit Vitals  BP 126/78   Pulse 81   Temp 36.9 ??C (98.4 ??F) (Oral)   Ht 172.7 cm (5' 8)   Wt 83 kg (183 lb)   SpO2 98%   BMI 27.83 kg/m??  12/02/23 1255   PainSc: 8         Physical Exam  Constitutional:       General: He is not in acute distress.     Appearance: Normal appearance. He is well-developed and well-groomed. He is not ill-appearing.   HENT:      Head: Normocephalic and atraumatic.      Mouth/Throat:      Lips: Pink.      Mouth: Mucous membranes are moist.   Eyes:      Conjunctiva/sclera: Conjunctivae normal.   Cardiovascular:      Rate and Rhythm: Normal rate and regular rhythm.      Heart sounds: Normal heart sounds, S1 normal and S2 normal.   Pulmonary:      Effort: Pulmonary effort is normal.      Breath sounds: Normal breath sounds and air entry.   Skin:     General: Skin is warm and dry.   Neurological:      Mental Status: He is alert and oriented to person, place, and time.   Psychiatric:         Mood and Affect: Mood normal.         Behavior: Behavior is cooperative.          [facility]:     Medication adherence and barriers to the treatment plan have been addressed. Opportunities to optimize healthy behaviors have been discussed. Patient / caregiver voiced understanding.

## 2023-12-10 ENCOUNTER — Ambulatory Visit
Admit: 2023-12-10 | Payer: Medicare (Managed Care) | Attending: Rehabilitative and Restorative Service Providers" | Primary: Rehabilitative and Restorative Service Providers"

## 2023-12-10 ENCOUNTER — Ambulatory Visit: Admit: 2023-12-10 | Payer: Medicare (Managed Care)

## 2023-12-10 NOTE — Unmapped (Signed)
 OUTPATIENT OCCUPATIONAL THERAPY    UPPER EXTREMITY TREATMENT    Patient Name: Cory Bentley  Date of Birth:November 26, 1960  Date: 12/10/2023  Completed/Authorized Visits: 3/99  Insurance Type: [employer] of Care Certification Dates: 11/05/2023 - 01/28/2024    Encounter Diagnosis   Name Primary?    Dupuytren's contracture of right hand Yes     Reason for referral: OT evaluation and treat   Referring Provider: None Per Patient Pcp  Onset of Symptoms: sx 10/23/23  Per Referring Provider's note:   Post-op Plan/Instructions:     The patient will be discharged home.  He will begin occupational therapy at his first postoperative visit.    Communication preference: verbal, written, visual  Prognosis: fair due to motivation, health status     OT EVALUATION ASSESSMENT:   63 y.o. year old male with above diagnosis. Deval presents with digit widget on R small finger. He has the distal phalanx of the ring finger wrapped with coban secondary to digit widget cutting into tip of adjacent finger. 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 Patient requires skilled Occupational Therapy services for decreased range of motion, decreased strength, orthotic fit/management, impaired daily activities of living as appropriate.     Previous Level of Function: Pt was previously independent with all ADLs and IADLs.     CURRENT LEVEL OF FUNCTION  Social and Occupational: he had an infection in the R SF secondary to diabetes.  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  This patient has no co-morbidities affecting occupational performance.  A problem focused assessment was performed.  Please refer to Current level of function section for further details.     PLAN  Short Term Goals:  1. In 1 session, patient will perform home exercise program with need for cuing to max IND with ADLs and IADLs. (met)    Long Term Goals:   1. In 12 weeks, patient will perform upgraded home exercise program, to include progression to strengthening, independently to max IND with ADLs including bathing activities.  2. In 12 weeks, pt will demo functional fist to max IND with grasp and release of daily living tools such as those used for grooming activities.   3. In 12 weeks, patient will demo finger extension of 15 degrees to increase independence with I/ADLs.  4. In 12 weeks, patient will demonstrate an average grip strength that is 65% of the contralateral side (left) to max IND with I/ADLs, such as carrying/lifting heavy items around the home.  5. In 12 weeks, patient will self report =< 2/10 pain score with activities to max IND with I/ADLs, such as writing, lifting/carrying functional items.    OT  PLAN OF CARE:  Pt will participate in:  Self Care/Hometraining  Orthotic Fit/Management   Therapeutic Exercise  Therapeutic Activity   Neuromuscular Re-education  Ultrasound  Hot/Cold Pack  Electrical Stimulation  Iontophoresis  Orthotic/Prosthetic Measure and Fit   Joint Mobilization  Physical Performance Measure   Manual Therapy    Planned frequency and duration of treatment: every other week/ 12 weeks. Plan will be adjusted as necessary.     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               AROM (degrees) Date: 11/05/2023  Right Date: 11/05/2023  Left 11/26/23  RIght   Wrist      Extension/flexion      Radial/ulnar deviation      Forearm      Supination/pronation      Elbow      Ext/flex      Composite flex to [facility]   (cm lack) Full   full   Index      Middle      Ring      Small      Digit Extension 1 cm from [facility] to fingernail of SF     Thumb  Opposition         Digit ROM R Small finger 11/26/23  finger     MCP ext/flex -65/95      PIP ext/flex -64/87      DIP ext/flex -68/80      TAM total         Grip and Pinch Strength Date:  Right Date:  Left   Gross Grip Strength (lbs)  Position 2 Male - age 61-64 R: 51-137 L: 27-116    Trial 1     Trial 2     Trial 3          Average         Other notes: 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 This is due to several factors including but not limited to hormonal fluctuations caused by natural aging, gender affirming care, the presence of underlying or undetected medial conditions, and the historically poor representation of several populations throughout normative testing. Therefore functional deficits should be prioritized when considering deficits or impairments.       Wound/Incision(s) or Scar: digit widget present    Edema: min    Educated pt on the need to perform active range of motion to allow adequate venous return and prevent stiffness.     TREATMENT:  Daily report: My hand feels fine.   Pain: Pain present?: yes; Intensity: 8/10. The pain happens when it is pulling     Manual Therapy (38 minutes):  Patient provided with heat pack prior to start of exercises to increase blood flow, tissue extensibility, and joint mobility for 5 minutes (untimed) to prepare the affected area to start therapy session. Patient's skin was intact prior to and after application. Retrograde massage to 5th digit to aid in reducing fluid build up. PROM digit extension to unaffected digits which were notably tight. Soft tissue to volar and dorsal forearm to reduce extrinsic tightness.         Post treatment Assessment: Maceration has improved however skin at the volar MP crease is dark brown/grey. Therapist will monitor 2/2  to diabetic concerns for poor wound healing and potential for necrotic tissue. Pt did enter session very upset that appointment arrival time was different than actual appointment time. Pt felt as though clinic was not being forthcoming by providing early arrival time. After de escalation therapist explained the purpose for the early arrival time and provided print out and highlighted visit start times. Pt was appreciative     Plan: continue with ROM    Home Program:   11/26/23 - Patient vended silicone silicone cap (size L)    Apply low to moderate heat 10 min prior to exercises for improved tissue extensibility  Initiated home training  Access Code: [email or web address]  URL: [email or web address]  Date: 11/05/2023  Prepared by: Consepcion Cleaves    Program Notes  Do not do exercises if it causes pain greater than 3/10 (discomfort).    Exercises  - Seated Finger Composite Flexion Extension  - 4 x daily - 7 x weekly - 1 sets - 10 reps - 5 seconds hold    I reviewed the no-show/attendance policy with the patient and caregiver(s). The family is aware that they must call to cancel appointments more than 24 hours in advance. They are also aware that if they late cancel or no-show three times, we reserve the right to cancel their remaining appointments. This policy is in place to allow us  to best serve the needs of our caseload.    Treatment Rendered:   Manual Therapy Techniques: 38 min      Total Treatment Time:  43 minutes    Patient Education:  Topics: home program, disease process  Education Provided to: patient  Education Type: education, demonstration, literature  Response to education/teachback: verbal understanding received, return demonstration    PAST MEDICAL HISTORY:  Reviewed   Past Medical History[1]    Past Surgical History: Reviewed  Past Surgical History[2]    Allergies: Reviewed  Patient has no known allergies.    Medications: Reviewed  Current Medications[3]    I attest that I have reviewed the above information.  Signed: Alexi Geibel K Jancie Kercher, OT  12/10/2023 8:11 AM           [1]   Past Medical History:  Diagnosis Date    Cellulitis of left foot 07/24/2021    Diabetes mellitus         Gastroparesis due to DM     09/09/2019    Hypertension     Psoriasis     Substance abuse (CMS-HCC)    [2]   Past Surgical History:  Procedure Laterality Date    PR ANESTH,CLEFT LIP REPAIR Right 04/20/2018    Procedure: DEBRIDEMENT; SKIN, SUBCUTANEOUS TISSUE, & MUSCLE HAND and FINGER wounds;  Surgeon: Logan Amaryllis Aliment, MD;  Location: MAIN OR [place];  Service: Plastics    PR APPLICATION UNIPLANE EXTERNAL FIXATION SYSTEM Right 10/23/2023    Procedure: UPPER EXTREMITY -APPLICATION OF A UNIPLANE (PINS OR WIRES IN 1 PLANE), UNILATERAL, EXTERNAL FIXATION SYSTEM;  Surgeon: Bonifacio Cordella HERO, MD;  Location: OR [facility] [place];  Service: Orthopedics    PR APPLY FOREARM SPLINT,STATIC Right 05/05/2018    Procedure: application right short arm ulnar gutter splint;  Surgeon: Logan Amaryllis Aliment, MD;  Location: MAIN OR [place];  Service: Plastics    PR APPLY FOREARM SPLINT,STATIC Right 05/11/2018    Procedure: Applic Short Arm Splint; Static;  Surgeon: Eagen Gene Deune, MD;  Location: MAIN OR [place];  Service: Plastics    PR COLSC FLX W/RMVL OF TUMOR POLYP LESION SNARE TQ  Left 10/06/2023    Procedure: COLONOSCOPY FLEX; W/REMOV TUMOR/LES BY SNARE;  Surgeon: Lucian Mikle Pfeiffer, MD;  Location: GI PROCEDURES [facility];  Service: Gastroenterology    PR DEBRIDEMENT MUSCLE &/FASCIA 1ST 20 SQ CM/< Right 04/27/2018    Procedure: debridement right palmar wound and dorsal right small finger wound;  Surgeon: Logan Amaryllis Aliment, MD;  Location: MAIN OR [place];  Service: Plastics    PR FTH/GF FR W/DIR CLSR F/C/C/M/N/AX/G/H/F 20SQCM/< Right 05/11/2018    Procedure: Full thickness skin graft to right palmar hand and right small finger wounds;  Surgeon: Logan Amaryllis Aliment, MD;  Location: MAIN OR [place];  Service: Plastics    PR NEGATIVE PRESSURE WOUND THERAPY DME <= 50 SQ CM Right 04/22/2018    Procedure: Right Hand Wound Vac Change;  Surgeon: Barnetta Everts, MD;  Location: MAIN OR [place];  Service: Plastics    PR SUB GRFT F/S/N/H/F/G/M/D /<100SCM /<1ST 25 SCM Right 05/05/2018    Procedure: application allograft human skin to right hand and right small finger wounds;  Surgeon: Logan Amaryllis Aliment, MD;  Location: MAIN OR [place];  Service: Plastics    PR UPPER GI ENDOSCOPY,DIAGNOSIS Left 10/06/2023    Procedure: UGI ENDO, INCLUDE ESOPHAGUS, STOMACH, & DUODENUM &/OR JEJUNUM; DX W/WO COLLECTION SPECIMN, BY BRUSH OR WASH;  Surgeon: Lucian Mikle Pfeiffer, MD;  Location: [facility] PROCED[name];  Service: Gastroenterology    PR WOUND PREP, PED, TRK/ARM/LG 1ST 100 CM Right 05/05/2018    Procedure: Debridement right hand and small finger wounds in preparation for skin grafting;  Surgeon: Logan Amaryllis Aliment, MD;  Location: MAIN OR [facility];  Service: Plastics    PR WOUND PREP, PED, TRK/ARM/LG 1ST 100 CM Right 05/11/2018    Procedure: Surg Preparation/Creation Of Recipient Site By Excision Of Open Wound/Burn Eschar/Scar, Or Incisional Release Of Scar Contracture, Trunk/Arms/Legs; 1st 100 Sq Cm Or 1% Body Area Of Infants & Children;  Surgeon: Logan Amaryllis Aliment, MD;  Location: MAIN OR ;  Service: Plastics    Right toe amputation     [3]   Current Outpatient Medications:     atorvastatin  (LIPITOR ) 40 MG tablet, TAKE 1 TABLET(40 MG) BY MOUTH DAILY, Disp: 90 tablet, Rfl: 2    chlorthalidone  (HYGROTON ) 25 MG tablet, Take 1 tablet (25 mg total) by mouth every morning., Disp: 100 tablet, Rfl: 3    diaper,brief,adult,disposable Misc, 1 each by Miscellaneous route two (2) times a day., Disp: 20 each, Rfl: 2    dulaglutide  (TRULICITY ) 1.5 mg/0.5 mL PnIj, Inject 0.5 mL (1.5 mg total) under the skin every seven (7) days., Disp: , Rfl:     empagliflozin  (JARDIANCE ) 10 mg tablet, Take 2 tablets (20 mg total) by mouth daily., Disp: 180 tablet, Rfl: 2    empty container Misc, Use as directed to dispose of Stelara  syringes., Disp: 1 each, Rfl: 2    flash glucose sensor (FREESTYLE LIBRE 14 DAY SENSOR) kit, Apply new sensor every 14 days and use as directed to monitor and check blood sugars, Disp: 2 each, Rfl: 12    fluocinolone  acetonide oil 0.01 % Drop, Apply 2 -4 drops to ears twice a day, Disp: 20 mL, Rfl: 4    gabapentin  (NEURONTIN ) 800 MG tablet, Take 1 tablet (800 mg total) by mouth four (4) times a day., Disp: 270 tablet, Rfl: 2    hydrocortisone  2.5 % ointment, APPLY TO PSORIASIS IN EARS UP TO TWICE DAILY AS NEEDED UNTIL SMOOTH (Patient not taking: Reported on 12/02/2023), Disp: 28.35 g, Rfl: 0  insulin  aspart (NOVOLOG  FLEXPEN U-100 INSULIN ) 100 unit/mL (3 mL) injection pen, Inject 0.1 mL (10 Units total) under the skin Three (3) times a day before meals., Disp: 27 mL, Rfl: 3    NIFEdipine  (PROCARDIA  XL) 30 MG 24 hr tablet, Take 1 tablet (30 mg total) by mouth daily., Disp: 100 tablet, Rfl: 3    nortriptyline (PAMELOR) 10 MG capsule, Take 1 capsule (10 mg total) by mouth nightly., Disp: , Rfl:     omeprazole  (PRILOSEC) 20 MG capsule, TAKE 1 CAPSULE(20 MG) BY MOUTH DAILY, Disp: 90 capsule, Rfl: 1    testosterone  20.25 mg/1.25 gram (1.62 %) gel pump, PLACE 2 SPRAYS ON SKIN DAILY, Disp: 75 g, Rfl: 1    ustekinumab -kfce (YESINTEK ) 45 mg/0.5 mL Syrg, Inject the contents of 1 syringe (45 mg total) under the skin every 12 weeks., Disp: 2 mL, Rfl: 2

## 2023-12-11 MED ORDER — TRULICITY 3 MG/0.5 ML SUBCUTANEOUS PEN INJECTOR
SUBCUTANEOUS | 2 refills | 28.00000 days | Status: CP
Start: 2023-12-11 — End: ?

## 2023-12-11 NOTE — Unmapped (Signed)
 Good afternoon Cory Bentley     I have reviewed your labs     Your B12 is elevated - Please stop taking any B12 supplements     Your A1c is elevated at 6.8  You are currently taking trulicity  1.5mg  weekly   We need to increase this to 3mg  weekly   I will send in the new prescription     Otherwise, your labs are normal     Verneita

## 2023-12-17 NOTE — Unmapped (Signed)
 Please notify pt of lab result/recommendations note by phone and if they do not answer mail them a lab result letter   Thanks   Verneita

## 2023-12-23 NOTE — Unmapped (Addendum)
 OUTPATIENT OCCUPATIONAL THERAPY    UPPER EXTREMITY TREATMENT    Patient Name: Cory Bentley  Date of Birth:[birth year]  Date: 12/24/2023  Completed/Authorized Visits: 4/99  Insurance Type: [employer] of Care Certification Dates: 11/05/2023 - 01/28/2024    Encounter Diagnosis   Name Primary?    Dupuytren's contracture of right hand Yes       Reason for referral: OT evaluation and treat   Referring Provider: None Per Patient Pcp  Onset of Symptoms: sx 10/23/23  Per Referring Provider's note:   Post-op Plan/Instructions:     The patient will be discharged home.  He will begin occupational therapy at his first postoperative visit.    Communication preference: verbal, written, visual  Prognosis: fair due to motivation, health status     OT EVALUATION ASSESSMENT:   63 y.o. year old male with above diagnosis. Eustacio presents with digit widget on R small finger. He has the distal phalanx of the ring finger wrapped with coban secondary to digit widget cutting into tip of adjacent finger. 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 Patient requires skilled Occupational Therapy services for decreased range of motion, decreased strength, orthotic fit/management, impaired daily activities of living as appropriate.     Previous Level of Function: Pt was previously independent with all ADLs and IADLs.     CURRENT LEVEL OF FUNCTION  Social and Occupational: he had an infection in the R SF secondary to diabetes.  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  This patient has no co-morbidities affecting occupational performance.  A problem focused assessment was performed.  Please refer to Current level of function section for further details.     PLAN  Short Term Goals:  1. In 1 session, patient will perform home exercise program with need for cuing to max IND with ADLs and IADLs. (met)    Long Term Goals:   1. In 12 weeks, patient will perform upgraded home exercise program, to include progression to strengthening, independently to max IND with ADLs including bathing activities.  2. In 12 weeks, pt will demo functional fist to max IND with grasp and release of daily living tools such as those used for grooming activities.   3. In 12 weeks, patient will demo finger extension of 15 degrees to increase independence with I/ADLs.  4. In 12 weeks, patient will demonstrate an average grip strength that is 65% of the contralateral side (left) to max IND with I/ADLs, such as carrying/lifting heavy items around the home.  5. In 12 weeks, patient will self report =< 2/10 pain score with activities to max IND with I/ADLs, such as writing, lifting/carrying functional items.    OT  PLAN OF CARE:  Pt will participate in:  Self Care/Hometraining  Orthotic Fit/Management   Therapeutic Exercise  Therapeutic Activity   Neuromuscular Re-education  Ultrasound  Hot/Cold Pack  Electrical Stimulation  Iontophoresis  Orthotic/Prosthetic Measure and Fit   Joint Mobilization  Physical Performance Measure   Manual Therapy    Planned frequency and duration of treatment: every other week/ 12 weeks. Plan will be adjusted as necessary.     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               AROM (degrees) Date: 11/05/2023  Right Date: 11/05/2023  Left 11/26/23  RIght   Wrist      Extension/flexion      Radial/ulnar deviation      Forearm      Supination/pronation      Elbow      Ext/flex      Composite flex to [facility]   (cm lack) Full   full   Index      Middle      Ring      Small      Digit Extension 1 cm from [facility] to fingernail of SF  4.5 cm from the [facility]   Thumb  Opposition         Digit ROM R Small finger 11/26/23  R Small finger  12/24/23     MCP ext/flex -65/95 -48/88     PIP ext/flex -64/87 -30/2     DIP ext/flex -68/80 -52/40     TAM total         Grip and Pinch Strength Date:  Right Date:  Left   Gross Grip Strength (lbs)  Position 2 Male - age 85-64 R: 51-137 L: 27-116    Trial 1     Trial 2     Trial 3          Average         Other notes: 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 This is due to several factors including but not limited to hormonal fluctuations caused by natural aging, gender affirming care, the presence of underlying or undetected medial conditions, and the historically poor representation of several populations throughout normative testing. Therefore functional deficits should be prioritized when considering deficits or impairments.       Wound/Incision(s) or Scar: digit widget present    Edema: min    Educated pt on the need to perform active range of motion to allow adequate venous return and prevent stiffness.     TREATMENT:  Daily report: that it has been feeling swollen  Pain: Pain present?: yes; Intensity: not quantified this session /10. The pain happens when it is pulling     Manual Therapy (35 minutes):  Therapist performed retrograde massage to help reduce edema in the R SF. Therapist performed retrograde in the direction towards the body to simulate fluids away from the affected area and to help increase mobility. Patient was able to tolerate treatment.  Patient performed concurrently with AROM PIP flexion and full composite flexion    Orthotic Fit/Management (15 minutes):  Therapist fabricated digit extension orthosis, with goal of immobilization.  Patient educated on splint wearing protocol, including at all times except to check skin and exercises.  Patient also educated on the need to perform frequent skin checks to ensure that there is no skin break down.  Patient educated on proper care of orthosis, including cleaning procedures and advising to stay away from heat sources. Patient educated on and demonstrated proper donning and doffing of the orthosis.  Patient advised that if there are any remaining questions or problems regarding the orthosis, they are encouraged to contact us .     Self Care/home training (5 minutes):  Therapist issued HEP with patient demonstration (see below) with handouts provided to the patient.   Educated pt on weightbearing precautions and healing time-line.   Patient also instructed in coban wrapping as needed to let the orthosis stay in place.      Post treatment Assessment: Patient presents to therapy with increase in swelling as pins were not visible. Patient states it was swollen for approximately a week and therapist notified doctor of the changes. Patient demonstrates improvements with extension this session, but is unable to activate flexion at PIP joint of the R SF.  After MD: Patient provided with custom fabricated orthosis as patient had pins removed at MD appointment. Patient was pleased with fit and comfort prior to leaving the clinic.    Plan: continue with ROM    Home Program:   Access Code: [email or web address]  URL: [email or web address]  Date: 12/24/2023  Prepared by: Tawni Ruth    Program Notes  Do not do exercises if it causes pain greater than 3/10 (discomfort).    Exercises  - Seated Finger Composite Flexion Extension  - 4 x daily - 7 x weekly - 1 sets - 10 reps - 5 seconds hold  - Wrist Tendon Gliding  - 3 x daily - 7 x weekly - 1 sets - 10 reps - 3 seconds hold  - Seated Finger PIP AROM  - 3 x daily - 7 x weekly - 1 sets - 10 reps - 3 seconds hold  - PIP Extension with Reverse Blocking  - 3 x daily - 7 x weekly - 1 sets - 10 reps - 3 seconds hold  11/26/23 - Patient vended silicone silicone cap (size L)    Apply low to moderate heat 10 min prior to exercises for improved tissue extensibility  Initiated home training  Access Code: [email or web address]  URL: [email or web address]  Date: 11/05/2023  Prepared by: Consepcion Cleaves    Program Notes  Do not do exercises if it causes pain greater than 3/10 (discomfort).    Exercises  - Seated Finger Composite Flexion Extension  - 4 x daily - 7 x weekly - 1 sets - 10 reps - 5 seconds hold    I reviewed the no-show/attendance policy with the patient and caregiver(s). The family is aware that they must call to cancel appointments more than 24 hours in advance. They are also aware that if they late cancel or no-show three times, we reserve the right to cancel their remaining appointments. This policy is in place to allow us  to best serve the needs of our caseload.    Treatment Rendered:   Orthotic Management/Training: 15 min  Manual Therapy Techniques: 35 min  Self Care/Home Training: 5 min      Total Treatment Time:  55 minutes    Patient Education:  Topics: home program, disease process  Education Provided to: patient  Education Type: education, demonstration, literature  Response to education/teachback: verbal understanding received, return demonstration    PAST MEDICAL HISTORY:  Reviewed   Past Medical History[1]  Past Surgical History: Reviewed  Past Surgical History[2]    Allergies: Reviewed  Patient has no known allergies.    Medications: Reviewed  Current Medications[3]    I attest that I have reviewed the above information.  Signed: Tawni KANDICE Ruth, OT  12/24/2023 11:29 AM           [1]   Past Medical History:  Diagnosis Date Cellulitis of left foot 07/24/2021    Diabetes mellitus    (CMS-HCC)     Gastroparesis due to DM    (CMS-HCC) 09/09/2019    Hypertension     Psoriasis     Substance abuse (CMS-HCC)    [2]   Past Surgical History:  Procedure Laterality Date    PR ANESTH,CLEFT LIP REPAIR Right 04/20/2018    Procedure: DEBRIDEMENT; SKIN, SUBCUTANEOUS TISSUE, & MUSCLE HAND and FINGER wounds;  Surgeon: Logan Amaryllis Aliment, MD;  Location: MAIN OR [place];  Service: Plastics    PR APPLICATION UNIPLANE EXTERNAL FIXATION SYSTEM Right 10/23/2023    Procedure: UPPER EXTREMITY -APPLICATION OF A UNIPLANE (PINS OR WIRES IN 1 PLANE), UNILATERAL, EXTERNAL FIXATION SYSTEM;  Surgeon: Bonifacio Cordella HERO, MD;  Location: OR [facility];  Service: Orthopedics    PR APPLY FOREARM SPLINT,STATIC Right 05/05/2018    Procedure: application right short arm ulnar gutter splint;  Surgeon: Logan Amaryllis Aliment, MD;  Location: MAIN OR [place];  Service: Plastics    PR APPLY FOREARM SPLINT,STATIC Right 05/11/2018    Procedure: Applic Short Arm Splint; Static;  Surgeon: Logan Amaryllis Aliment, MD;  Location: MAIN OR [place];  Service: Plastics    PR COLSC FLX W/RMVL OF TUMOR POLYP LESION SNARE TQ Left 10/06/2023    Procedure: COLONOSCOPY FLEX; W/REMOV TUMOR/LES BY SNARE;  Surgeon: Lucian Mikle Pfeiffer, MD;  Location: GI PROCEDURES [facility];  Service: Gastroenterology    PR DEBRIDEMENT MUSCLE &/FASCIA 1ST 20 SQ CM/< Right 04/27/2018    Procedure: debridement right palmar wound and dorsal right small finger wound;  Surgeon: Logan Amaryllis Aliment, MD;  Location: MAIN OR [place];  Service: Plastics    PR FTH/GF FR W/DIR CLSR F/C/C/M/N/AX/G/H/F 20SQCM/< Right 05/11/2018    Procedure: Full thickness skin graft to right palmar hand and right small finger wounds;  Surgeon: Logan Amaryllis Aliment, MD;  Location: MAIN OR [place];  Service: Plastics    PR NEGATIVE PRESSURE WOUND THERAPY DME <= 50 SQ CM Right 04/22/2018    Procedure: Right Hand Wound Vac Change;  Surgeon: Barnetta Everts, MD;  Location: MAIN OR [place];  Service: Plastics    PR SUB GRFT F/S/N/H/F/G/M/D /<100SCM /<1ST 25 SCM Right 05/05/2018    Procedure: application allograft human skin to right hand and right small finger wounds;  Surgeon: Logan Amaryllis Aliment, MD;  Location: MAIN OR [place];  Service: Plastics    PR UPPER GI ENDOSCOPY,DIAGNOSIS Left 10/06/2023    Procedure: UGI ENDO, INCLUDE ESOPHAGUS, STOMACH, & DUODENUM &/OR JEJUNUM; DX W/WO COLLECTION SPECIMN, BY BRUSH OR WASH;  Surgeon: Lucian Mikle Pfeiffer, MD;  Location: GI PROCED[facility] [place] [facility];  Service: Gastroenterology    PR WOUND PREP, PED, TRK/ARM/LG 1ST 100 CM Right 05/05/2018    Procedure: Debridement right hand and small finger wounds in preparation for skin grafting;  Surgeon: Logan Amaryllis Aliment, MD;  Location: MAIN OR [facility];  Service: Plastics    PR WOUND PREP, PED, TRK/ARM/LG 1ST 100 CM Right 05/11/2018    Procedure: Surg Preparation/Creation Of Recipient Site By Excision Of Open Wound/Burn Eschar/Scar, Or Incisional Release Of Scar Contracture, Trunk/Arms/Legs; 1st  100 Sq Cm Or 1% Body Area Of Infants & Children;  Surgeon: Eagen Gene Deune, MD;  Location: MAIN OR [place];  Service: Plastics    Right toe amputation     [3]   Current Outpatient Medications:     atorvastatin  (LIPITOR ) 40 MG tablet, TAKE 1 TABLET(40 MG) BY MOUTH DAILY, Disp: 90 tablet, Rfl: 2    chlorthalidone  (HYGROTON ) 25 MG tablet, Take 1 tablet (25 mg total) by mouth every morning., Disp: 100 tablet, Rfl: 3    diaper,brief,adult,disposable Misc, 1 each by Miscellaneous route two (2) times a day., Disp: 20 each, Rfl: 2    dulaglutide  (TRULICITY ) 3 mg/0.5 mL injection pen, Inject 0.5 mL (3 mg total) under the skin every seven (7) days., Disp: 2 mL, Rfl: 2    empagliflozin  (JARDIANCE ) 10 mg tablet, Take 2 tablets (20 mg total) by mouth daily., Disp: 180 tablet, Rfl: 2    empty container Misc, Use as directed to dispose of Stelara  syringes., Disp: 1 each, Rfl: 2    flash glucose sensor (FREESTYLE LIBRE 14 DAY SENSOR) kit, Apply new sensor every 14 days and use as directed to monitor and check blood sugars, Disp: 2 each, Rfl: 12    fluocinolone  acetonide oil 0.01 % Drop, Apply 2 -4 drops to ears twice a day, Disp: 20 mL, Rfl: 4    gabapentin  (NEURONTIN ) 800 MG tablet, Take 1 tablet (800 mg total) by mouth four (4) times a day., Disp: 270 tablet, Rfl: 2    hydrocortisone  2.5 % ointment, APPLY TO PSORIASIS IN EARS UP TO TWICE DAILY AS NEEDED UNTIL SMOOTH (Patient not taking: Reported on 12/02/2023), Disp: 28.35 g, Rfl: 0    insulin  aspart (NOVOLOG  FLEXPEN U-100 INSULIN ) 100 unit/mL (3 mL) injection pen, Inject 0.1 mL (10 Units total) under the skin Three (3) times a day before meals., Disp: 27 mL, Rfl: 3    NIFEdipine  (PROCARDIA  XL) 30 MG 24 hr tablet, Take 1 tablet (30 mg total) by mouth daily., Disp: 100 tablet, Rfl: 3    nortriptyline (PAMELOR) 10 MG capsule, Take 1 capsule (10 mg total) by mouth nightly., Disp: , Rfl:     omeprazole  (PRILOSEC) 20 MG capsule, TAKE 1 CAPSULE(20 MG) BY MOUTH DAILY, Disp: 90 capsule, Rfl: 1    testosterone  20.25 mg/1.25 gram (1.62 %) gel pump, PLACE 2 SPRAYS ON SKIN DAILY, Disp: 75 g, Rfl: 1    ustekinumab -kfce (YESINTEK ) 45 mg/0.5 mL Syrg, Inject the contents of 1 syringe (45 mg total) under the skin every 12 weeks., Disp: 2 mL, Rfl: 2

## 2023-12-23 NOTE — Unmapped (Signed)
 ORTHOPAEDIC RETURN CLINIC NOTE     ASSESSMENT:  Cory Bentley is a 63 y.o. male  status post R SF digit widget application.   Date of surgery: 10/23/23    PLAN:  Digit widget removed in clinic today.  Patient to return to OT today for extension splint fabrication.  He will begin on working on range of motion of the PIP joint.  Return in 4 to 6 weeks for repeat evaluation    INTERIM HISTORY:  Cory Bentley is doing well postop.  There has been complete correction of the proximal interphalangeal joint contracture with the digit widget.  He denies drainage from the finger.    PHYSICAL EXAM:  General: Well developed, well nourished male in no apparent distress  Musculoskeletal: Moderate swelling of the small finger.  No signs of infection.  Pin sites clean.  Neutral position of proximal interphalangeal joint without residual flexion contracture    IMAGING:  None.

## 2023-12-24 ENCOUNTER — Ambulatory Visit
Admit: 2023-12-24 | Discharge: 2023-12-25 | Payer: Medicare (Managed Care) | Attending: Plastic and Reconstructive Surgery | Primary: Plastic and Reconstructive Surgery

## 2023-12-24 NOTE — Unmapped (Signed)
 Thank you for choosing [facility]!  We appreciate the opportunity to participate in your care.      If any questions or concerns arise after your visit, please do not hesitant to contact me by [facility] or by calling my clinical support, Irving Burton, at [phone].      Voicemail messages: Messages are checked between 8:00 am- 4:00 pm Monday- Friday.    MyChart messages: These messages are checked by the clinical staff during normal business hours 8:30 am-4:30 pm Monday-Friday every 24-48 hours and are for non-urgent, non-emergent concerns. You may be asked to return for a follow up visit if it is deemed your questions are best handled in the clinic setting. If you are not signed up on [facility], please call the [facility] at [phone] to receive help activating your account.    Please let me know if I can be of assistance with this or other orthopaedic issues in the future.

## 2023-12-24 NOTE — Unmapped (Addendum)
 I reviewed this patient case and all documentation provided by the learner and was readily available for consultation during their interaction with the patient.  I agree with the assessment and plan listed below.    [facility] Specialty Pharmacist        [facility] Clinical Assessment & Refill Coordination Note    Cory Bentley, DOB: 08/11/1960  Phone: [phone] (home)     All above HIPAA information was verified with patient.     Was a Nurse, learning disability used for this call? No    Specialty Medication(s):   Inflammatory Disorders: Yesintek      Current Medications[1]     Changes to medications: [name] reports no changes at this time.    Medication list has been reviewed and updated in Epic: Yes    Allergies[2]    Changes to allergies: No    Allergies have been reviewed and updated in Epic: Yes    SPECIALTY MEDICATION ADHERENCE     Yesintek  45mg /0.29mL : 0 doses of medicine on hand       Medication Adherence    Patient reported X missed doses in the last month: 0  Specialty Medication: [facility]        Specialty medication(s) dose(s) confirmed: Regimen is correct and unchanged.     Are there any concerns with adherence? No    Adherence counseling provided? Not needed    CLINICAL MANAGEMENT AND INTERVENTION      Clinical Benefit Assessment:    Do you feel the medicine is effective or helping your condition? Yes    Clinical Benefit counseling provided? Not needed    Adverse Effects Assessment:    Are you experiencing any side effects? No    Are you experiencing difficulty administering your medicine? No    Quality of Life Assessment:    Quality of Life    Rheumatology  Oncology  Dermatology  1. What impact has your specialty medication had on the symptoms of your skin condition (i.e. itchiness, soreness, stinging)?: Minimal  2. What impact has your specialty medication had on your comfort level with your skin?: Minimal  Cystic Fibrosis          How many days over the past month did your psoriasis  keep you from your normal activities? For example, brushing your teeth or getting up in the morning. 0    Have you discussed this with your provider? Not needed    Acute Infection Status:    Acute infections noted within Epic:  No active infections    Patient reported infection: None    Therapy Appropriateness:    Is therapy appropriate based on current medication list, adverse reactions, adherence, clinical benefit and progress toward achieving therapeutic goals? Yes, therapy is appropriate and should be continued     Clinical Intervention:    Was an intervention completed as part of this clinical assessment? No    DISEASE/MEDICATION-SPECIFIC INFORMATION      For patients on injectable medications: Next injection is scheduled for 9/12.    Chronic Inflammatory Diseases: Have you experienced any flares in the last month? No  Has this been reported to your provider? Not applicable    PATIENT SPECIFIC NEEDS     Does the patient have any physical, cognitive, or cultural barriers? No    Is the patient high risk? No    Does the patient require physician intervention or other additional services (i.e., nutrition, smoking  cessation, social work)? No    Does the patient have an additional or emergency contact listed in their chart? Yes    SOCIAL DETERMINANTS OF HEALTH     At [facility], we have learned that life circumstances - like trouble affording food, housing, utilities, or transportation can affect the health of many of our patients.   That is why we wanted to ask: are you currently experiencing any life circumstances that are negatively impacting your health and/or quality of life? No    Social Drivers of Health     Food Insecurity: No Food Insecurity (06/03/2023)    Hunger Vital Sign     Worried About Running Out of Food in the Last Year: Never true     Ran Out of Food in the Last Year: Never true   Tobacco Use: Low Risk  (12/09/2023)    Received from [facility]    Patient History     Smoking Tobacco Use: Never     Smokeless Tobacco Use: Never     Passive Exposure: Not on file   Transportation Needs: No Transportation Needs (06/03/2023)    PRAPARE - Transportation     Lack of Transportation (Medical): No     Lack of Transportation (Non-Medical): No   Alcohol  Use: Not At Risk (12/02/2023)    Alcohol  Use     How often do you have a drink containing alcohol ?: Monthly or less     How many drinks containing alcohol  do you have on a typical day when you are drinking?: 1 - 2     How often do you have 5 or more drinks on one occasion?: Never   Housing: Low Risk  (06/03/2023)    Housing     Within the past 12 months, have you ever stayed: outside, in a car, in a tent, in an overnight shelter, or temporarily in someone else's home (i.e. couch-surfing)?: No     Are you worried about losing your housing?: No   Physical Activity: Insufficiently Active (11/30/2020)    Exercise Vital Sign     Days of Exercise per Week: 3 days     Minutes of Exercise per Session: 40 min   Utilities: Low Risk  (06/03/2023)    Utilities     Within the past 12 months, have you been unable to get utilities (heat, electricity) when it was really needed?: No   Stress: No Stress Concern Present (11/30/2020)    [name] of Occupational Health - Occupational Stress Questionnaire     Feeling of Stress : Only a little   Interpersonal Safety: Not At Risk (10/23/2023)    Interpersonal Safety     Unsafe Where You Currently Live: No     Physically Hurt by Anyone: No     Abused by Anyone: No   Substance Use: Medium Risk (12/02/2023)    Substance Use     In the past year, how often have you used prescription drugs for non-medical reasons?: Never     In the past year, how often have you used illegal drugs?: Daily or Almost Daily     In the past year, have you used any substance for non-medical reasons?: Yes   Intimate Partner Violence: Not At Risk (10/23/2023)    Humiliation, Afraid, Rape, and Kick questionnaire     Fear of Current or Ex-Partner: No     Emotionally Abused: No     Physically Abused: No     Sexually  Abused: No   Social Connections: Unknown (11/30/2020)    Social Connection and Isolation Panel     Frequency of Communication with Friends and Family: More than three times a week     Frequency of Social Gatherings with Friends and Family: More than three times a week     Attends Religious Services: Patient declined     Database administrator or Organizations: Patient declined     Attends Banker Meetings: Patient declined     Marital Status: Divorced   Programmer, applications: Low Risk  (07/24/2022)    Overall Financial Resource Strain (CARDIA)     Difficulty of Paying Living Expenses: Not hard at all   Health Literacy: Low Risk  (07/24/2022)    Health Literacy     : Never   Internet Connectivity: Possible Internet connectivity concern identified (09/05/2021)    Internet Connectivity     Do you have access to internet services: Yes     How do you connect to the internet: Not on file     Is your internet connection strong enough for you to watch video on your device without major problems?: Yes     Do you have enough data to get through the month?: Yes     Does at least one of the devices have a camera that you can use for video chat?: Yes       Would you be willing to receive help with any of the needs that you have identified today? Not applicable       SHIPPING     Specialty Medication(s) to be Shipped:   Inflammatory Disorders: Yesintek     Other medication(s) to be shipped: No additional medications requested for fill at this time    Specialty Medications not needed at this time: N/A     Changes to insurance: No    Cost and Payment: Patient has a $0 copay, payment information is not required.    Delivery Scheduled: Yes, Expected medication delivery date: 9/12.     Medication will be delivered via Same Day Courier to the confirmed prescription address in [facility].    The patient will receive a drug information handout for each medication shipped and additional FDA Medication Guides as required.  Verified that patient has previously received a Conservation officer, historic buildings and a Surveyor, mining.    The patient or caregiver noted above participated in the development of this care plan and knows that they can request review of or adjustments to the care plan at any time.      All of the patient's questions and concerns have been addressed.    Camie FORBES Jes, PharmD   [facility] Specialty Pharmacist          [1]   Current Outpatient Medications   Medication Sig Dispense Refill    atorvastatin  (LIPITOR ) 40 MG tablet TAKE 1 TABLET(40 MG) BY MOUTH DAILY 90 tablet 2    chlorthalidone  (HYGROTON ) 25 MG tablet Take 1 tablet (25 mg total) by mouth every morning. 100 tablet 3    diaper,brief,adult,disposable Misc 1 each by Miscellaneous route two (2) times a day. 20 each 2    dulaglutide  (TRULICITY ) 3 mg/0.5 mL injection pen Inject 0.5 mL (3 mg total) under the skin every seven (7) days. 2 mL 2    empagliflozin  (JARDIANCE ) 10 mg tablet Take 2 tablets (20 mg total) by mouth daily. 180 tablet 2    empty  container Misc Use as directed to dispose of Stelara  syringes. 1 each 2    flash glucose sensor (FREESTYLE LIBRE 14 DAY SENSOR) kit Apply new sensor every 14 days and use as directed to monitor and check blood sugars 2 each 12    fluocinolone  acetonide oil 0.01 % Drop Apply 2 -4 drops to ears twice a day 20 mL 4    gabapentin  (NEURONTIN ) 800 MG tablet Take 1 tablet (800 mg total) by mouth four (4) times a day. 270 tablet 2    hydrocortisone  2.5 % ointment APPLY TO PSORIASIS IN EARS UP TO TWICE DAILY AS NEEDED UNTIL SMOOTH (Patient not taking: Reported on 12/02/2023) 28.35 g 0    insulin  aspart (NOVOLOG  FLEXPEN U-100 INSULIN ) 100 unit/mL (3 mL) injection pen Inject 0.1 mL (10 Units total) under the skin Three (3) times a day before meals. 27 mL 3    NIFEdipine  (PROCARDIA  XL) 30 MG 24 hr tablet Take 1 tablet (30 mg total) by mouth daily. 100 tablet 3    nortriptyline (PAMELOR) 10 MG capsule Take 1 capsule (10 mg total) by mouth nightly.      omeprazole  (PRILOSEC) 20 MG capsule TAKE 1 CAPSULE(20 MG) BY MOUTH DAILY 90 capsule 1    testosterone  20.25 mg/1.25 gram (1.62 %) gel pump PLACE 2 SPRAYS ON SKIN DAILY 75 g 1    ustekinumab -kfce (YESINTEK ) 45 mg/0.5 mL Syrg Inject the contents of 1 syringe (45 mg total) under the skin every 12 weeks. 2 mL 2     No current facility-administered medications for this visit.   [2] No Known Allergies

## 2023-12-26 MED FILL — YESINTEK 45 MG/0.5 ML SUBCUTANEOUS SYRINGE: SUBCUTANEOUS | 84 days supply | Qty: 0.5 | Fill #1

## 2023-12-26 NOTE — Unmapped (Signed)
 OUTPATIENT OCCUPATIONAL THERAPY    UPPER EXTREMITY TREATMENT    Patient Name: Cory Bentley  Date of Birth:10-28-1960  Date: 01/07/2024  Completed/Authorized Visits: 5/99  Insurance Type: [employer] of Care Certification Dates: 11/05/2023 - 01/28/2024    Encounter Diagnosis   Name Primary?    Dupuytren's contracture of right hand Yes       Reason for referral: OT evaluation and treat   Referring Provider: None Per Patient Pcp  Onset of Symptoms: sx 10/23/23  Per Referring Provider's note:   Post-op Plan/Instructions:     The patient will be discharged home.  He will begin occupational therapy at his first postoperative visit.    Communication preference: verbal, written, visual  Prognosis: fair due to motivation, health status     OT EVALUATION ASSESSMENT:   63 y.o. year old male with above diagnosis. Bronsen presents with digit widget on R small finger. He has the distal phalanx of the ring finger wrapped with coban secondary to digit widget cutting into tip of adjacent finger. 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 Patient requires skilled Occupational Therapy services for decreased range of motion, decreased strength, orthotic fit/management, impaired daily activities of living as appropriate.     Previous Level of Function: Pt was previously independent with all ADLs and IADLs.     CURRENT LEVEL OF FUNCTION  Social and Occupational: he had an infection in the R SF secondary to diabetes.  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  This patient has no co-morbidities affecting occupational performance.  A problem focused assessment was performed.  Please refer to Current level of function section for further details.     PLAN  Short Term Goals:  1. In 1 session, patient will perform home exercise program with need for cuing to max IND with ADLs and IADLs. (met)    Long Term Goals:   1. In 12 weeks, patient will perform upgraded home exercise program, to include progression to strengthening, independently to max IND with ADLs including bathing activities.  2. In 12 weeks, pt will demo functional fist to max IND with grasp and release of daily living tools such as those used for grooming activities.   3. In 12 weeks, patient will demo finger extension of 15 degrees to increase independence with I/ADLs.  4. In 12 weeks, patient will demonstrate an average grip strength that is 65% of the contralateral side (left) to max IND with I/ADLs, such as carrying/lifting heavy items around the home.  5. In 12 weeks, patient will self report =< 2/10 pain score with activities to max IND with I/ADLs, such as writing, lifting/carrying functional items.    OT  PLAN OF CARE:  Pt will participate in:  Self Care/Hometraining  Orthotic Fit/Management   Therapeutic Exercise  Therapeutic Activity   Neuromuscular Re-education  Ultrasound  Hot/Cold Pack  Electrical Stimulation  Iontophoresis  Orthotic/Prosthetic Measure and Fit   Joint Mobilization  Physical Performance Measure   Manual Therapy    Planned frequency and duration of treatment: every other week/ 12 weeks. Plan will be adjusted as necessary.     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               AROM (degrees) Date: 11/05/2023  Right Date: 11/05/2023  Left 11/26/23  RIght   Wrist      Extension/flexion      Radial/ulnar deviation      Forearm      Supination/pronation      Elbow      Ext/flex      Composite flex to [facility]   (cm lack) Full   full   Index      Middle      Ring      Small      Digit Extension 1 cm from [facility] to fingernail of SF  4.5 cm from the [facility]   Thumb  Opposition         Digit ROM R Small finger 11/26/23  R Small finger  12/24/23 R SF   01/07/24  *after heat and massage    MCP ext/flex -65/95 -48/88 -50/88    PIP ext/flex -64/87 -30/2 -30/33    DIP ext/flex -68/80 -52/40 -8/10    TAM total   43      Grip and Pinch Strength Date:  Right Date:  Left   Gross Grip Strength (lbs)  Position 2 Male - age 57-64 R: 51-137 L: 27-116    Trial 1     Trial 2     Trial 3          Average         Other notes: 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 This is due to several factors including but not limited to hormonal fluctuations caused by natural aging, gender affirming care, the presence of underlying or undetected medial conditions, and the historically poor representation of several populations throughout normative testing. Therefore functional deficits should be prioritized when considering deficits or impairments.       Wound/Incision(s) or Scar: digit widget present    Edema: min    Educated pt on the need to perform active range of motion to allow adequate venous return and prevent stiffness.     01/07/24   7.7 cm circumference in the R SF    TREATMENT:  Daily report:  that it has been feeling swollen  Pain: Pain present?: yes; Intensity: not quantified this session /10. The pain happens when it is pulling     Manual Therapy (40 minutes):  Therapist performed retrograde massage to help reduce edema in the R SF. Therapist performed retrograde in the direction towards the body to simulate fluids away from the affected area and to help increase mobility. Patient was able to tolerate treatment.  Patient performed concurrently with PROM PIP flexion and full composite flexion and place and holds, with 10 seconds hold at end range  Patient provided with digit sleeve (size XL) to wear 10-15 minutes, throughout the day as needed. Was instructed in how to check capillary refill.    Post treatment Assessment: Patient continues to demonstrate moderate edema of the R SF with pin site still open and no signs of infection or drainage. Therapist notified MD for swelling, who came to the hand clinic and saw the digit. Patient was able to passively flex the finger towards the palm at the end of the session. Patient provided with digit sleeve for edema management. Patient was unable to maintain place and hold positioning.    Plan: continue with ROM, edema management    Home Program:   ADDED: 01/07/24  - Seated Finger Composite Flexion Stretch  - 3 x daily - 7 x weekly - 1 sets - 10 reps - 10-15 seconds hold  - Seated Edema Reduction Massage  - 3 x daily - 7 x weekly - 1 sets - 1 reps - 2-3 minutes hold    Access Code: [email or web address]  URL: [email or web address]  Date: 12/24/2023  Prepared by: Tawni Ruth    Program Notes  Do not do exercises if it causes pain greater than 3/10 (discomfort).    Exercises  - Seated Finger Composite Flexion Extension  - 4 x daily - 7 x weekly - 1 sets - 10 reps - 5 seconds hold  - Wrist Tendon Gliding  - 3 x daily - 7 x weekly - 1 sets - 10 reps - 3 seconds hold  - Seated Finger PIP AROM  - 3 x daily - 7 x weekly - 1 sets - 10 reps - 3 seconds hold  - PIP Extension with Reverse Blocking  - 3 x daily - 7 x weekly - 1 sets - 10 reps - 3 seconds hold  11/26/23 - Patient vended silicone silicone cap (size L)    Apply low to moderate heat 10 min prior to exercises for improved tissue extensibility  Initiated home training  Access Code: [email or web address]  URL: [email or web address]  Date: 11/05/2023  Prepared by: Consepcion Cleaves    Program Notes  Do not do exercises if it causes pain greater than 3/10 (discomfort).    Exercises  - Seated Finger Composite Flexion Extension  - 4 x daily - 7 x weekly - 1 sets - 10 reps - 5 seconds hold    I reviewed the no-show/attendance policy with the patient and caregiver(s). The family is aware that they must call to cancel appointments more than 24 hours in advance. They are also aware that if they late cancel or no-show three times, we reserve the right to cancel their remaining appointments. This policy is in place to allow us  to best serve the needs of our caseload.    Treatment Rendered:   Manual Therapy Techniques: 40 min      Total Treatment Time:  40 minutes    Patient Education:  Topics: home program, disease process  Education Provided to: patient  Education Type: education, demonstration, literature  Response to education/teachback: verbal understanding received, return demonstration    PAST MEDICAL HISTORY:  Reviewed   Past Medical History[1]    Past Surgical History: Reviewed  Past Surgical History[2]    Allergies: Reviewed  Patient has no known allergies.    Medications: Reviewed  Current Medications[3]    I attest that I have reviewed the above information.  Signed: Tawni KANDICE Ruth, OT  01/07/2024 10:37 AM           [  1]   Past Medical History:  Diagnosis Date    Cellulitis of left foot 07/24/2021    Diabetes mellitus    (CMS-HCC)     Gastroparesis due to DM    (CMS-HCC) 09/09/2019    Hypertension     Psoriasis     Substance abuse (CMS-HCC)    [2]   Past Surgical History:  Procedure Laterality Date    PR ANESTH,CLEFT LIP REPAIR Right 04/20/2018    Procedure: DEBRIDEMENT; SKIN, SUBCUTANEOUS TISSUE, & MUSCLE HAND and FINGER wounds;  Surgeon: Logan Amaryllis Aliment, MD;  Location: MAIN OR [place];  Service: Plastics    PR APPLICATION UNIPLANE EXTERNAL FIXATION SYSTEM Right 10/23/2023    Procedure: UPPER EXTREMITY -APPLICATION OF A UNIPLANE (PINS OR WIRES IN 1 PLANE), UNILATERAL, EXTERNAL FIXATION SYSTEM;  Surgeon: Bonifacio Cordella HERO, MD;  Location: OR [facility];  Service: Orthopedics    PR APPLY FOREARM SPLINT,STATIC Right 05/05/2018    Procedure: application right short arm ulnar gutter splint;  Surgeon: Logan Amaryllis Aliment, MD;  Location: MAIN OR [place];  Service: Plastics    PR APPLY FOREARM SPLINT,STATIC Right 05/11/2018    Procedure: Applic Short Arm Splint; Static;  Surgeon: Logan Amaryllis Aliment, MD;  Location: MAIN OR [place];  Service: Plastics    PR COLSC FLX W/RMVL OF TUMOR POLYP LESION SNARE TQ Left 10/06/2023    Procedure: COLONOSCOPY FLEX; W/REMOV TUMOR/LES BY SNARE;  Surgeon: Lucian Mikle Pfeiffer, MD;  Location: GI PROCEDURES [facility];  Service: Gastroenterology    PR DEBRIDEMENT MUSCLE &/FASCIA 1ST 20 SQ CM/< Right 04/27/2018    Procedure: debridement right palmar wound and dorsal right small finger wound;  Surgeon: Logan Amaryllis Aliment, MD;  Location: MAIN OR [place];  Service: Plastics    PR FTH/GF FR W/DIR CLSR F/C/C/M/N/AX/G/H/F 20SQCM/< Right 05/11/2018    Procedure: Full thickness skin graft to right palmar hand and right small finger wounds;  Surgeon: Logan Amaryllis Aliment, MD;  Location: MAIN OR [place];  Service: Plastics    PR NEGATIVE PRESSURE WOUND THERAPY DME <= 50 SQ CM Right 04/22/2018    Procedure: Right Hand Wound Vac Change;  Surgeon: Barnetta Everts, MD;  Location: MAIN OR [place];  Service: Plastics    PR SUB GRFT F/S/N/H/F/G/M/D /<100SCM /<1ST 25 SCM Right 05/05/2018    Procedure: application allograft human skin to right hand and right small finger wounds;  Surgeon: Logan Amaryllis Aliment, MD;  Location: MAIN OR [place];  Service: Plastics    PR UPPER GI ENDOSCOPY,DIAGNOSIS Left 10/06/2023    Procedure: UGI ENDO, INCLUDE ESOPHAGUS, STOMACH, & DUODENUM &/OR JEJUNUM; DX W/WO COLLECTION SPECIMN, BY BRUSH OR WASH;  Surgeon: Lucian Mikle Pfeiffer, MD;  Location: [facility] [place];  Service: Gastroenterology    PR WOUND PREP, PED, TRK/ARM/LG 1ST 100 CM Right 05/05/2018    Procedure: Debridement right hand and small finger wounds in preparation for skin grafting;  Surgeon: Logan Amaryllis Aliment, MD;  Location: MAIN OR [facility];  Service: Plastics    PR WOUND PREP, PED, TRK/ARM/LG 1ST 100 CM Right 05/11/2018    Procedure: Surg Preparation/Creation Of Recipient Site By Excision Of Open Wound/Burn Eschar/Scar, Or Incisional Release Of Scar Contracture, Trunk/Arms/Legs; 1st 100 Sq Cm Or 1% Body Area Of Infants & Children;  Surgeon: Logan Amaryllis Aliment, MD;  Location: MAIN OR ;  Service: Plastics    Right toe amputation     [3]   Current Outpatient Medications:     atorvastatin  (LIPITOR ) 40 MG tablet, TAKE 1 TABLET(40 MG) BY MOUTH  DAILY, Disp: 90 tablet, Rfl: 2    chlorthalidone  (HYGROTON ) 25 MG tablet, Take 1 tablet (25 mg total) by mouth every morning., Disp: 100 tablet, Rfl: 3    diaper,brief,adult,disposable Misc, 1 each by Miscellaneous route two (2) times a day., Disp: 20 each, Rfl: 2    dulaglutide  (TRULICITY ) 3 mg/0.5 mL injection pen, Inject 0.5 mL (3 mg total) under the skin every seven (7) days., Disp: 2 mL, Rfl: 2    empagliflozin  (JARDIANCE ) 10 mg tablet, Take 2 tablets (20 mg total) by mouth daily., Disp: 180 tablet, Rfl: 2    empty container Misc, Use as directed to dispose of Stelara  syringes., Disp: 1 each, Rfl: 2    flash glucose sensor (FREESTYLE LIBRE 14 DAY SENSOR) kit, Apply new sensor every 14 days and use as directed to monitor and check blood sugars, Disp: 2 each, Rfl: 12    fluocinolone  acetonide oil 0.01 % Drop, Apply 2 -4 drops to ears twice a day, Disp: 20 mL, Rfl: 4    gabapentin  (NEURONTIN ) 800 MG tablet, Take 1 tablet (800 mg total) by mouth four (4) times a day., Disp: 270 tablet, Rfl: 2    hydrocortisone  2.5 % ointment, APPLY TO PSORIASIS IN EARS UP TO TWICE DAILY AS NEEDED UNTIL SMOOTH (Patient not taking: Reported on 12/02/2023), Disp: 28.35 g, Rfl: 0    insulin  aspart (NOVOLOG  FLEXPEN U-100 INSULIN ) 100 unit/mL (3 mL) injection pen, Inject 0.1 mL (10 Units total) under the skin Three (3) times a day before meals., Disp: 27 mL, Rfl: 3    NIFEdipine  (PROCARDIA  XL) 30 MG 24 hr tablet, Take 1 tablet (30 mg total) by mouth daily., Disp: 100 tablet, Rfl: 3    nortriptyline (PAMELOR) 10 MG capsule, Take 1 capsule (10 mg total) by mouth nightly., Disp: , Rfl:     omeprazole  (PRILOSEC) 20 MG capsule, TAKE 1 CAPSULE(20 MG) BY MOUTH DAILY, Disp: 90 capsule, Rfl: 1    testosterone  20.25 mg/1.25 gram (1.62 %) gel pump, PLACE 2 SPRAYS ON SKIN DAILY, Disp: 75 g, Rfl: 1    ustekinumab -kfce (YESINTEK ) 45 mg/0.5 mL Syrg, Inject the contents of 1 syringe (45 mg total) under the skin every 12 weeks., Disp: 2 mL, Rfl: 2

## 2024-01-07 ENCOUNTER — Ambulatory Visit: Admit: 2024-01-07 | Payer: Medicare (Managed Care)

## 2024-01-07 ENCOUNTER — Ambulatory Visit
Admit: 2024-01-07 | Payer: Medicare (Managed Care) | Attending: Rehabilitative and Restorative Service Providers" | Primary: Rehabilitative and Restorative Service Providers"

## 2024-01-07 DIAGNOSIS — L089 Local infection of the skin and subcutaneous tissue, unspecified: Principal | ICD-10-CM

## 2024-01-07 MED ORDER — CEPHALEXIN 500 MG CAPSULE
ORAL_CAPSULE | Freq: Four times a day (QID) | ORAL | 0 refills | 10.00000 days | Status: CP
Start: 2024-01-07 — End: 2024-01-17

## 2024-01-09 NOTE — Unmapped (Unsigned)
 OUTPATIENT OCCUPATIONAL THERAPY    UPPER EXTREMITY TREATMENT    Patient Name: Cory Bentley  Date of Birth:1960-05-10  Date: 01/13/2024  Completed/Authorized Visits: The Patient Has No Linked Referral  Insurance Type: [employer] of Care Certification Dates:     No diagnosis found.      Reason for referral: OT evaluation and treat   Referring Provider:    Onset of Symptoms: sx 10/23/23  Per Referring Provider's note:   Post-op Plan/Instructions:     The patient will be discharged home.  He will begin occupational therapy at his first postoperative visit.    Communication preference: verbal, written, visual  Prognosis: fair due to motivation, health status     OT EVALUATION ASSESSMENT:   63 y.o. year old male with above diagnosis. Kamaury presents with digit widget on R small finger. He has the distal phalanx of the ring finger wrapped with coban secondary to digit widget cutting into tip of adjacent finger. 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 Patient requires skilled Occupational Therapy services for decreased range of motion, decreased strength, orthotic fit/management, impaired daily activities of living as appropriate.     Previous Level of Function: Pt was previously independent with all ADLs and IADLs.     CURRENT LEVEL OF FUNCTION  Social and Occupational: he had an infection in the R SF secondary to diabetes.  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  This patient has no co-morbidities affecting occupational performance.  A problem focused assessment was performed.  Please refer to Current level of function section for further details.     PLAN  Short Term Goals:  1. In 1 session, patient will perform home exercise program with need for cuing to max IND with ADLs and IADLs. (met)    Long Term Goals:   1. In 12 weeks, patient will perform upgraded home exercise program, to include progression to strengthening, independently to max IND with ADLs including bathing activities.  2. In 12 weeks, pt will demo functional fist to max IND with grasp and release of daily living tools such as those used for grooming activities.   3. In 12 weeks, patient will demo finger extension of 15 degrees to increase independence with I/ADLs.  4. In 12 weeks, patient will demonstrate an average grip strength that is 65% of the contralateral side (left) to max IND with I/ADLs, such as carrying/lifting heavy items around the home.  5. In 12 weeks, patient will self report =< 2/10 pain score with activities to max IND with I/ADLs, such as writing, lifting/carrying functional items.    OT  PLAN OF CARE:  Pt will participate in:  Self Care/Hometraining  Orthotic Fit/Management   Therapeutic Exercise  Therapeutic Activity   Neuromuscular Re-education  Ultrasound  Hot/Cold Pack  Electrical Stimulation  Iontophoresis  Orthotic/Prosthetic Measure and Fit   Joint Mobilization  Physical Performance Measure   Manual Therapy    Planned frequency and duration of treatment: every other week/ 12 weeks. Plan will be adjusted as necessary.     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               AROM (degrees) Date: 11/05/2023  Right Date: 11/05/2023  Left 11/26/23  RIght   Wrist      Extension/flexion Radial/ulnar deviation      Forearm      Supination/pronation      Elbow      Ext/flex      Composite flex to [facility]   (cm lack) Full   full   Index      Middle      Ring      Small      Digit Extension 1 cm from [facility] to fingernail of SF  4.5 cm from the [facility]   Thumb  Opposition         Digit ROM R Small finger 11/26/23  R Small finger  12/24/23 R SF   01/07/24  *after heat and massage    MCP ext/flex -65/95 -48/88 -50/88    PIP ext/flex -64/87 -30/2 -30/33    DIP ext/flex -68/80 -52/40 -8/10    TAM total   43      Grip and Pinch Strength Date:  Right Date:  Left   Gross Grip Strength (lbs)  Position 2 Male - age 88-64 R: 51-137 L: 27-116    Trial 1     Trial 2     Trial 3          Average         Other notes: 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 This is due to several factors including but not limited to hormonal fluctuations caused by natural aging, gender affirming care, the presence of underlying or undetected medial conditions, and the historically poor representation of several populations throughout normative testing. Therefore functional deficits should be prioritized when considering deficits or impairments.       Wound/Incision(s) or Scar: digit widget present    Edema: min    Educated pt on the need to perform active range of motion to allow adequate venous return and prevent stiffness.     01/07/24   7.7 cm circumference in the R SF    TREATMENT:  Daily report:  that it has been feeling swollen  Pain: Pain present?: yes; Intensity: not quantified this session /10. The pain happens when it is pulling     Manual Therapy (40 minutes):  Therapist performed retrograde massage to help reduce edema in the R SF. Therapist performed retrograde in the direction towards the body to simulate fluids away from the affected area and to help increase mobility. Patient was able to tolerate treatment.  Patient performed concurrently with PROM PIP flexion and full composite flexion and place and holds, with 10 seconds hold at end range  Patient provided with digit sleeve (size XL) to wear 10-15 minutes, throughout the day as needed. Was  instructed in how to check capillary refill.    Post treatment Assessment: Patient continues to demonstrate moderate edema of the R SF with pin site still open and no signs of infection or drainage. Therapist notified MD for swelling, who came to the hand clinic and saw the digit. Patient was able to passively flex the finger towards the palm at the end of the session. Patient provided with digit sleeve for edema management. Patient was unable to maintain place and hold positioning.    Plan: continue with ROM, edema management    Home Program:   ADDED: 01/07/24  - Seated Finger Composite Flexion Stretch  - 3 x daily - 7 x weekly - 1 sets - 10 reps - 10-15 seconds hold  - Seated Edema Reduction Massage  - 3 x daily - 7 x weekly - 1 sets - 1 reps - 2-3 minutes hold    Access Code: [email or web address]  URL: [email or web address]  Date: 12/24/2023  Prepared by: Tawni Ruth    Program Notes  Do not do exercises if it causes pain greater than 3/10 (discomfort).    Exercises  - Seated Finger Composite Flexion Extension  - 4 x daily - 7 x weekly - 1 sets - 10 reps - 5 seconds hold  - Wrist Tendon Gliding  - 3 x daily - 7 x weekly - 1 sets - 10 reps - 3 seconds hold  - Seated Finger PIP AROM  - 3 x daily - 7 x weekly - 1 sets - 10 reps - 3 seconds hold  - PIP Extension with Reverse Blocking  - 3 x daily - 7 x weekly - 1 sets - 10 reps - 3 seconds hold  11/26/23 - Patient vended silicone silicone cap (size L)    Apply low to moderate heat 10 min prior to exercises for improved tissue extensibility  Initiated home training  Access Code: [email or web address]  URL: [email or web address]  Date: 11/05/2023  Prepared by: Consepcion Cleaves    Program Notes  Do not do exercises if it causes pain greater than 3/10 (discomfort).    Exercises  - Seated Finger Composite Flexion Extension  - 4 x daily - 7 x weekly - 1 sets - 10 reps - 5 seconds hold    I reviewed the no-show/attendance policy with the patient and caregiver(s). The family is aware that they must call to cancel appointments more than 24 hours in advance. They are also aware that if they late cancel or no-show three times, we reserve the right to cancel their remaining appointments. This policy is in place to allow us  to best serve the needs of our caseload.    Treatment Rendered:   Manual Therapy Techniques: 40 min      Total Treatment Time:  40 minutes    Patient Education:  Topics: home program, disease process  Education Provided to: patient  Education Type: education, demonstration, literature  Response to education/teachback: verbal understanding received, return demonstration    PAST MEDICAL HISTORY:  Reviewed   Past Medical History[1]    Past Surgical History: Reviewed  Past Surgical History[2]    Allergies: Reviewed  Patient has no known allergies.    Medications: Reviewed  Current Medications[3]    I attest that I have reviewed the above information.  Signed: Olam CHRISTELLA Muslim, OT  01/13/2024 1:55 PM           [1]   Past Medical History:  Diagnosis Date    Cellulitis of left foot  07/24/2021    Diabetes mellitus    (CMS-HCC)     Gastroparesis due to DM    (CMS-HCC) 09/09/2019    Hypertension     Psoriasis     Substance abuse (CMS-HCC)    [2]   Past Surgical History:  Procedure Laterality Date    PR ANESTH,CLEFT LIP REPAIR Right 04/20/2018    Procedure: DEBRIDEMENT; SKIN, SUBCUTANEOUS TISSUE, & MUSCLE HAND and FINGER wounds;  Surgeon: Logan Amaryllis Aliment, MD;  Location: MAIN OR [place];  Service: Plastics    PR APPLICATION UNIPLANE EXTERNAL FIXATION SYSTEM Right 10/23/2023    Procedure: UPPER EXTREMITY -APPLICATION OF A UNIPLANE (PINS OR WIRES IN 1 PLANE), UNILATERAL, EXTERNAL FIXATION SYSTEM;  Surgeon: Bonifacio Cordella HERO, MD;  Location: [facility] [place];  Service: Orthopedics    PR APPLY FOREARM SPLINT,STATIC Right 05/05/2018    Procedure: application right short arm ulnar gutter splint;  Surgeon: Logan Amaryllis Aliment, MD;  Location: MAIN OR [place];  Service: Plastics    PR APPLY FOREARM SPLINT,STATIC Right 05/11/2018    Procedure: Applic Short Arm Splint; Static;  Surgeon: Logan Amaryllis Aliment, MD;  Location: MAIN OR [place];  Service: Plastics    PR COLSC FLX W/RMVL OF TUMOR POLYP LESION SNARE TQ Left 10/06/2023    Procedure: COLONOSCOPY FLEX; W/REMOV TUMOR/LES BY SNARE;  Surgeon: Lucian Mikle Pfeiffer, MD;  [facility]: GI PROCEDURES [facility];  Service: Gastroenterology    PR DEBRIDEMENT MUSCLE &/FASCIA 1ST 20 SQ CM/< Right 04/27/2018    Procedure: debridement right palmar wound and dorsal right small finger wound;  Surgeon: Logan Amaryllis Aliment, MD;  Location: MAIN OR [place];  Service: Plastics    PR FTH/GF FR W/DIR CLSR F/C/C/M/N/AX/G/H/F 20SQCM/< Right 05/11/2018    Procedure: Full thickness skin graft to right palmar hand and right small finger wounds;  Surgeon: Logan Amaryllis Aliment, MD;  Location: MAIN OR [place];  Service: Plastics    PR NEGATIVE PRESSURE WOUND THERAPY DME <= 50 SQ CM Right 04/22/2018    Procedure: Right Hand Wound Vac Change;  Surgeon: Barnetta Everts, MD;  Location: MAIN OR [place];  Service: Plastics    PR SUB GRFT F/S/N/H/F/G/M/D /<100SCM /<1ST 25 SCM Right 05/05/2018    Procedure: application allograft human skin to right hand and right small finger wounds;  Surgeon: Logan Amaryllis Aliment, MD;  Location: MAIN OR [place];  Service: Plastics    PR UPPER GI ENDOSCOPY,DIAGNOSIS Left 10/06/2023    Procedure: UGI ENDO, INCLUDE ESOPHAGUS, STOMACH, & DUODENUM &/OR JEJUNUM; DX W/WO COLLECTION SPECIMN, BY BRUSH OR WASH;  Surgeon: Lucian Mikle Pfeiffer, MD;  [facility]: [facility];  Service: Gastroenterology    PR WOUND PREP, PED, TRK/ARM/LG 1ST 100 CM Right 05/05/2018    Procedure: Debridement right hand and small finger wounds in preparation for skin grafting;  Surgeon: Logan Amaryllis Aliment, MD;  Location: MAIN OR [facility];  Service: Plastics    PR WOUND PREP, PED, TRK/ARM/LG 1ST 100 CM Right 05/11/2018    Procedure: Surg Preparation/Creation Of Recipient Site By Excision Of Open Wound/Burn Eschar/Scar, Or Incisional Release Of Scar Contracture, Trunk/Arms/Legs; 1st 100 Sq Cm Or 1% Body Area Of Infants & Children;  Surgeon: Logan Amaryllis Aliment, MD;  Location: MAIN OR [place];  Service: Plastics    Right toe amputation     [3]   Current Outpatient Medications:     atorvastatin  (LIPITOR ) 40 MG tablet, TAKE 1 TABLET(40 MG) BY MOUTH DAILY, Disp: 90 tablet, Rfl: 2    cephalexin  (KEFLEX ) 500 MG capsule, Take 1  capsule (500 mg total) by mouth four (4) times a day for 10 days., Disp: 40 capsule, Rfl: 0    chlorthalidone  (HYGROTON ) 25 MG tablet, Take 1 tablet (25 mg total) by mouth every morning., Disp: 100 tablet, Rfl: 3    diaper,brief,adult,disposable Misc, 1 each by Miscellaneous route two (2) times a day., Disp: 20 each, Rfl: 2    dulaglutide  (TRULICITY ) 3 mg/0.5 mL injection pen, Inject 0.5 mL (3 mg total) under the skin every seven (7) days., Disp: 2 mL, Rfl: 2    empagliflozin  (JARDIANCE ) 10 mg tablet, Take 2 tablets (20 mg total) by mouth daily., Disp: 180 tablet, Rfl: 2    empty container Misc, Use as directed to dispose of Stelara  syringes., Disp: 1 each, Rfl: 2    flash glucose sensor (FREESTYLE LIBRE 14 DAY SENSOR) kit, Apply new sensor every 14 days and use as directed to monitor and check blood sugars, Disp: 2 each, Rfl: 12    fluocinolone  acetonide oil 0.01 % Drop, Apply 2 -4 drops to ears twice a day, Disp: 20 mL, Rfl: 4    gabapentin  (NEURONTIN ) 800 MG tablet, Take 1 tablet (800 mg total) by mouth four (4) times a day., Disp: 270 tablet, Rfl: 2    hydrocortisone  2.5 % ointment, APPLY TO PSORIASIS IN EARS UP TO TWICE DAILY AS NEEDED UNTIL SMOOTH (Patient not taking: Reported on 12/02/2023), Disp: 28.35 g, Rfl: 0    insulin  aspart (NOVOLOG  FLEXPEN U-100 INSULIN ) 100 unit/mL (3 mL) injection pen, Inject 0.1 mL (10 Units total) under the skin Three (3) times a day before meals., Disp: 27 mL, Rfl: 3    NIFEdipine  (PROCARDIA  XL) 30 MG 24 hr tablet, Take 1 tablet (30 mg total) by mouth daily., Disp: 100 tablet, Rfl: 3    nortriptyline (PAMELOR) 10 MG capsule, Take 1 capsule (10 mg total) by mouth nightly., Disp: , Rfl:     omeprazole  (PRILOSEC) 20 MG capsule, TAKE 1 CAPSULE(20 MG) BY MOUTH DAILY, Disp: 90 capsule, Rfl: 1    testosterone  20.25 mg/1.25 gram (1.62 %) gel pump, PLACE 2 SPRAYS ON SKIN DAILY, Disp: 75 g, Rfl: 1    ustekinumab -kfce (YESINTEK ) 45 mg/0.5 mL Syrg, Inject the contents of 1 syringe (45 mg total) under the skin every 12 weeks., Disp: 2 mL, Rfl: 2

## 2024-01-13 NOTE — Unmapped (Signed)
 Patient call transferred from [facility] to staff.     Patient called and yelled at front desk staff due to inability to stack patient appointments with MD appt tomorrow.   Due to yelling [facility] instructed the patient that a therapist will be in contact and ended the call.     This therapist called the patient back to triage the conversation. Immediately the patient began yelling at the therapist. The therapist asked the patient to lower his voice and that we would work something out but couldn't do so in the animated state. Patient lowered his voice then raised it again. Pt reported his wife is a professor and he knows the head of [place] and should he have any more complaints he would be bringing them to the head of . Patient was refocused to task at hand.     Therapist educated patient that at the time of scheduling there was nothing available to stack. Therapist made a treatment type exception to get patient scheduled next day following MD appointment. Therapist offered to revisit helping to find a location closer to home. Pt reported therapy/MD did this in the past but he was not satisfied with options.     Several times through the phone call patient had to be reminded that he needed to cease yelling at the provider. Pt asked for Directors information for this department. This was provided.       A safe report has been submitted and department heads aware.

## 2024-01-14 ENCOUNTER — Encounter: Admit: 2024-01-14 | Discharge: 2024-01-14 | Payer: Medicare (Managed Care)

## 2024-01-14 ENCOUNTER — Inpatient Hospital Stay: Admit: 2024-01-14 | Discharge: 2024-01-14 | Payer: Medicare (Managed Care)

## 2024-01-14 ENCOUNTER — Ambulatory Visit
Admit: 2024-01-14 | Discharge: 2024-01-14 | Payer: Medicare (Managed Care) | Attending: Plastic and Reconstructive Surgery | Primary: Plastic and Reconstructive Surgery

## 2024-01-14 DIAGNOSIS — Z792 Long term (current) use of antibiotics: Principal | ICD-10-CM

## 2024-01-14 DIAGNOSIS — E118 Type 2 diabetes mellitus with unspecified complications: Principal | ICD-10-CM

## 2024-01-14 DIAGNOSIS — Z9189 Other specified personal risk factors, not elsewhere classified: Principal | ICD-10-CM

## 2024-01-14 DIAGNOSIS — M869 Osteomyelitis, unspecified: Principal | ICD-10-CM

## 2024-01-14 DIAGNOSIS — Z794 Long term (current) use of insulin: Principal | ICD-10-CM

## 2024-01-14 LAB — CBC W/ AUTO DIFF
BASOPHILS ABSOLUTE COUNT: 0.1 10*9/L (ref 0.0–0.1)
BASOPHILS RELATIVE PERCENT: 0.8 %
EOSINOPHILS ABSOLUTE COUNT: 0.5 10*9/L (ref 0.0–0.5)
EOSINOPHILS RELATIVE PERCENT: 5.1 %
HEMATOCRIT: 41.7 % (ref 39.0–48.0)
HEMOGLOBIN: 13.8 g/dL (ref 12.9–16.5)
LYMPHOCYTES ABSOLUTE COUNT: 1.9 10*9/L (ref 1.1–3.6)
LYMPHOCYTES RELATIVE PERCENT: 20.2 %
MEAN CORPUSCULAR HEMOGLOBIN CONC: 33.1 g/dL (ref 32.0–36.0)
MEAN CORPUSCULAR HEMOGLOBIN: 29.3 pg (ref 25.9–32.4)
MEAN CORPUSCULAR VOLUME: 88.4 fL (ref 77.6–95.7)
MEAN PLATELET VOLUME: 12.6 fL — ABNORMAL HIGH (ref 6.8–10.7)
MONOCYTES ABSOLUTE COUNT: 0.9 10*9/L — ABNORMAL HIGH (ref 0.3–0.8)
MONOCYTES RELATIVE PERCENT: 9.3 %
NEUTROPHILS ABSOLUTE COUNT: 6 10*9/L (ref 1.8–7.8)
NEUTROPHILS RELATIVE PERCENT: 64.6 %
PLATELET COUNT: 162 10*9/L (ref 150–450)
RED BLOOD CELL COUNT: 4.72 10*12/L (ref 4.26–5.60)
RED CELL DISTRIBUTION WIDTH: 13.9 % (ref 12.2–15.2)
WBC ADJUSTED: 9.3 10*9/L (ref 3.6–11.2)

## 2024-01-14 LAB — COMPREHENSIVE METABOLIC PANEL
ALBUMIN: 3.9 g/dL (ref 3.4–5.0)
ALKALINE PHOSPHATASE: 75 U/L (ref 46–116)
ALT (SGPT): 50 U/L — ABNORMAL HIGH (ref 10–49)
ANION GAP: 12 mmol/L (ref 5–14)
AST (SGOT): 41 U/L — ABNORMAL HIGH (ref <=34)
BILIRUBIN TOTAL: 0.4 mg/dL (ref 0.3–1.2)
BLOOD UREA NITROGEN: 22 mg/dL (ref 9–23)
BUN / CREAT RATIO: 27
CALCIUM: 9.9 mg/dL (ref 8.7–10.4)
CHLORIDE: 98 mmol/L (ref 98–107)
CO2: 28.8 mmol/L (ref 20.0–31.0)
CREATININE: 0.81 mg/dL (ref 0.73–1.18)
EGFR CKD-EPI (2021) MALE: >90 mL/min/1.73m2 (ref >=60)
GLUCOSE RANDOM: 146 mg/dL (ref 70–179)
POTASSIUM: 4.1 mmol/L (ref 3.4–4.8)
PROTEIN TOTAL: 7.8 g/dL (ref 5.7–8.2)
SODIUM: 139 mmol/L (ref 135–145)

## 2024-01-14 LAB — C-REACTIVE PROTEIN: C-REACTIVE PROTEIN: <5 mg/L (ref <=10.0)

## 2024-01-14 LAB — SEDIMENTATION RATE: ERYTHROCYTE SEDIMENTATION RATE: 68 mm/h — ABNORMAL HIGH (ref 0–20)

## 2024-01-14 MED ORDER — LINEZOLID 600 MG TABLET
ORAL_TABLET | Freq: Two times a day (BID) | ORAL | 0 refills | 14.00000 days | Status: CP
Start: 2024-01-14 — End: 2024-01-28

## 2024-01-14 MED ORDER — CIPROFLOXACIN 500 MG TABLET
ORAL_TABLET | Freq: Two times a day (BID) | ORAL | 0 refills | 14.00000 days | Status: CP
Start: 2024-01-14 — End: 2024-01-28

## 2024-01-14 NOTE — Unmapped (Signed)
 I contacted the patient at [facility] to inform him of his new arrival time of 2:05. He asked for the reason, and I explained that additional cases were added on, and since his is local only, those are scheduled in the afternoon. He then stated that Dr. Knoll had already set his surgery time. I clarified that Dr. Bonifacio does not assign arrival times. The patient mentioned that Scotty had told him 12:30. I apologized for the change but explained the updated schedule.  The patient became upset, spoke over me, and stated he would not come in at the new time. I told him I would have the nurse or Dr. Knoll call him directly. He responded, ???Yeah, you do that.???  I then contacted Damien and informed her of the call. She will reach out to the patient to confirm the arrival time again.

## 2024-01-14 NOTE — Unmapped (Addendum)
 Thank you for entrusting [facility] with your care!    FOLLOW-UP IN 1 WEEK FROM SURGERY ON 10/9/ AT 11 AM.     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  -Stop your current antibiotics to ensure accurate culture results.  -Proceed with debridement and culture collection tomorrow.  -After surgery, start taking linezolid (1 pill twice daily) and ciprofloxacin (1.5 pills twice daily).  -Watch for side effects like stomach upset, nausea, diarrhea, altered taste, black tongue, numbness, and tendinitis.  -Schedule a follow-up appointment in one week to review your culture results and adjust antibiotics if needed.    TYPE 2 DIABETES MELLITUS: Your diabetes is well-controlled with your current medications, Trulicity  and Jardiance .  -Continue taking Trulicity  and Jardiance  as prescribed.    PERIPHERAL NEUROPATHY AND CARPAL TUNNEL SYNDROME: You have nerve damage and carpal tunnel syndrome likely due to your long-standing diabetes, causing difficulty in grasping small objects.  -Monitor for any new numbness or tingling in your hands or feet during your antibiotic treatment.      Please note that your laboratory and other results may be visible to you in real time, possibly before they reach your provider. Please allow 48 hours for clinical interpretation of these results. Importantly, even if a result is flagged as abnormal, it may not be one that impacts your health.    [place] OPAT Program  OPAT Program Phone: [phone]   OPAT Program Fax:  [phone]   [facility] Phone: [phone] (toll free [phone]).    For urgent issues on nights and weekends you may reach the ID Physician on call through the [facility] Operator at [phone].     [name], MD PhD    [facility] at [facility]  [address]   [place], [place] [address]

## 2024-01-14 NOTE — Unmapped (Signed)
 PRE-OPERATIVE INFORMATION       Cordella Hickman, MD  [facility]  Clinical Support: Damien [phone]  (medical issues)  Surgery Scheduler: Arman Kiang [phone]  Financial Care Counselor: Evalene Kitty [phone]  Estimate Line: [phone]        Surgery Date: Tomorrow - Thursday, January 15, 2024 at the Amb[facility] Care Center located at [address] Floor, [place], [place] [address]    If a surgery date is not provided to you today, our surgery scheduler will reach out to you within 1 week to provide a surgery date. If you have not been contacted within 1 week of your surgical consultation, please contact our scheduler: Arman Kiang at [phone].   If need to cancel your surgery for any reason please call the scheduling office asap to due so.        Surgical Time  Surgical times are given out from 2:00pm-5:00pm on the last business day before your scheduled surgery. A preoperative nurse will call you during this time to confirm when you should arrive for your surgery. If you have not been called by 5:00pm please contact [phone].      The Night Before Your Surgery     DO NOT EAT OR DRINK ANYTHING AFTER MIDNIGHT, unless instructed otherwise. If you are diabetic, check with your primary care physician for special instructions concerning your insulin  dosage prior to surgery. Take your usual medications as prescribed, unless otherwise instructed.         The Morning of Your Surgery    You should wear loose fitting clothes that will not be restrictive to your surgical site. Please leave jewelry, credit cards, cash, and other valuables at home. Do not wear any piercings, nail polish, make-up, or metal hair clips the day before surgery. Contact lenses, glasses, and dentures must be removed before surgery. Please make sure to bring a case to store these items in during surgery.     You may take your usual medications as prescribed with a small sip of water, unless otherwise instructed. Do NOT drink a full glass of water.     A RESPONSIBLE ADULT (OVER 18) MUST ACCOMPANY YOU ON THE DAY OF SURGERY AND BE AVAILABLE THROUGHOUT YOUR PROCEDURE IN THE EVENT THERE ARE QUESTIONS OR COMPLICATION. THEY ALSO MUST BE AVAILABLE TO TAKE YOU HOME FOLLOWING YOUR PROCEDURE AS IT WILL NOT BE SAFE FOR YOU TO DRIVE OR TAKE PUBLIC TRANSPORTATION ALONE.     Medications    If you are instructed by Dr. Knoll to discontinue taking a medication, such as blood thinners, prior to surgery you will need to contact the prescribing provider of that medication to receive instructions on the safest way to do so.        FOLLOW-UP APPOINTMENTS    Your return appointment will be scheduled approximately 2-3 weeks after surgery, our scheduling office will call you to make a post operative follow up appointment after surgery.    If you need to reschedule this appointment or have questions as to the time of   your appointment please call.      OCCUPATIONAL THERAPY    You may have to attend Occupational Therapy following your surgery. A referral will be placed if it is necessary. If Occupational Therapy is determined to be necessary by your provider someone will be contacting you following your surgery to schedule. Many therapy appointments will be done same day after your first post-operative appointment with your provider.  IF YOU HAVE QUESTIONS or CONCERNS  During normal business hours, call Dr. Jenean clinical support staff member Damien at [phone] for medical issues, or Dr. Jenean surgery scheduler, Arman, at [phone] for scheduling concerns.      During evenings/nights/weekends, call [facility] at [phone] and ask for the on-call orthopaedic surgery resident for urgent medical issues.

## 2024-01-14 NOTE — Unmapped (Signed)
 INFECTIOUS DISEASES CLINIC  [address]  [place], [place]  [address]  P [phone]  F [phone]     Primary care provider: [name], FNP      [facility] Infectious Diseases Ambulatory Pre-Surgical Evaluation for OPAT (PrOPAT)     Cory Bentley is being seen in consultation at the request of Information Unavailable* for presurgical evaluation and management of Right 5th Finger Osteomyelitis after pin placement and assessment of the patient for need for post-operative IV or PO antibiotics.    Assessment and Plan:  Problem requiring evaluation: Right fifth finger Osteomyelitis with hardware in place  Date of scheduled surgery: 01/15/24  Surgical attending and service line: Dr. Cordella Bentley, Ortho, Hand  Date of Initial Surgical evaluation for infection: 01/14/24  Is hardware present: Yes  Is hardware to be removed: unknown at this time  Available microbiologic date: None current. Previous 04/2018 Enterbacter cloacae/Serratia - Susc to quinolones,  Previous 03/2018 MSSA - right hand; 03/2017 MSSA- Left hand  Available imaging: Xray finger right with osteomyelitis  Evaluation for need for IV ABX vs Oral option: Plan for Oral Abx (right handed; No other support for IV at home)  Does patient have insurance: Yes  [place] of Residence:  [facility]    Is the patient deemed a candidate for management by the [facility] OPAT program: Yes    TREATMENT:  Recommend Linezolid 600 mg PO BID and Ciprofloxacin 750 PO BID  14 days called in to pharmacy.  Patient instructed not to take any until the evening after surgery  Recommend stopping keflex  (hold any doses after noon 10/1)  Patient will NOT need first dose of antibiotics after surgery in the PACU    DIAGNOSTICS:  Cultures Requested: Aerobic and Anaerobic Culture  Monitor safety labs for toxicity of parenteral antibiotics with CBC w diff and CMP weekly   Will obtain baseline safety labs today   '  OPAT:  D/W OPAT team: ID pharmacist, RN, scheduling  OPAT Program RN Cory Bentley) spoke with the patient and discussed the processes and expectations in detail if IV antibiotics are needed and he is a candidate.   Assessment/Plan:     Right finger osteomyelitis after pin placement with infection of internal fixation device  Right finger osteomyelitis post-pin placement with persistent swelling and drainage. Previous Keflex  treatment ineffective. Scheduled for debridement and culture. Considering oral antibiotics post-surgery, with potential for IV if resistant bacteria. Previous MSSA infection susceptible to antibiotics. Discussed oral antibiotics' side effects, including GI upset, nausea, diarrhea, altered taste, black tongue, neuropathy, tendinitis. Linezolid may cause bone marrow suppression with prolonged use.  - Stop current antibiotics for accurate culture results.  - Proceed with debridement and culture collection tomorrow.  - Initiate oral antibiotics post-surgery: linezolid 1 pill twice daily and ciprofloxacin 1.5 pills twice daily.  - Monitor for side effects such as GI upset, nausea, diarrhea, altered taste, black tongue, neuropathy, and tendinitis.  - Schedule follow-up in one week to review culture results and adjust antibiotics if necessary.    Type 2 diabetes mellitus  Type 2 diabetes mellitus, well-controlled with A1c under 7, managed with Trulicity  and Jardiance . History of poor control with A1c of 14, leading to complications such as neuropathy.    Peripheral neuropathy and carpal tunnel syndrome  Peripheral neuropathy and carpal tunnel syndrome likely secondary to long-standing diabetes. Symptoms include difficulty grasping small objects. Potential for worsening neuropathy with linezolid treatment, requiring monitoring.  - Monitor for new numbness or tingling in  hands or feet during antibiotic treatment.            Diagnosis ICD-10-CM Associated Orders   1. At risk for adverse drug event  Z91.89 CBC w/ Differential     Comprehensive Metabolic Panel C-reactive protein     Sedimentation Rate     linezolid (ZYVOX) 600 mg tablet     ciprofloxacin HCl (CIPRO) 500 MG tablet      2. Long term (current) use of antibiotics  Z79.2 CBC w/ Differential     Comprehensive Metabolic Panel     C-reactive protein     Sedimentation Rate     linezolid (ZYVOX) 600 mg tablet     ciprofloxacin HCl (CIPRO) 500 MG tablet      3. Osteomyelitis of finger of right hand    (CMS-HCC)  M86.9 CBC w/ Differential     Comprehensive Metabolic Panel     C-reactive protein     Sedimentation Rate     linezolid (ZYVOX) 600 mg tablet     ciprofloxacin HCl (CIPRO) 500 MG tablet     Wound Culture, Aerobic     Wound Culture, Aerobic      4. Controlled type 2 diabetes mellitus with complication, with long-term current use of insulin     (CMS-HCC)  E11.8     Z79.4           Recommendations communicated via shared medical record.    []  I personally spent 65 minutes face-to-face and non-face-to-face in the care of this patient, which includes all pre, intra, and post visit time on the date of service.  All documented time was specific to the E/M visit and does not include any procedures that may have been performed.  [x]  Billed by E&M  Disposition:  To be seen 1 week post-surgery for evaluation   Return in about 8 days (around 01/22/2024) for In-person or Video Visit.    Cory Dorothyann Sink, MD PhD  [facility] Division of Infectious Diseases  [address]  [place], [place]           SUBJECTIVE   Chief complaint: Evaluation for need for OPAT to start after scheduled surgical procedure for subacute or chronic infection    This is my first time seeing the patient.  All records from the hospitalization, including H&P, consult notes, discharge summaries, radiology, pathology, laboratory and microbiology results were reviewed in detail and are summarized in the HPI.    History of Present Illness:  Cory Bentley is a 63 y.o. male  History of Present Illness    Cory Bentley is a 63 year old male with right finger osteomyelitis who presents for urgent evaluation of antibiotic therapy prior to surgery. He was referred by Doctor Null for urgent evaluation prior to surgery.    He has right thumb osteomyelitis following pin placement, characterized by persistent swelling and mild drainage from the pin sites. The swelling has not improved despite a 10-day course of Keflex , and the patient noticed some mild drainage from the pin sites. The infection began after he nicked his nail while scraping, leading to the current condition.    He has a history of diabetes for approximately 20 years, which is currently well-controlled with an A1c under 7, managed with Trulicity  and Jardiance . 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 He also has a history of MSSA infection in his right hand in December 2019, which required hospitalization and antibiotic treatment.  He has a history of substance use but has been clean for over two years. He lives alone with his dog and engages in activities such as playing golf, attending concerts, and volunteering. 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    No fever, chills, nausea, vomiting, diarrhea, rashes, chest pain, or palpitations. No known antibiotic allergies or intolerances. He is currently taking Lipitor , Trulicity , Jardiance , and occasionally nortriptyline for sleep, though he has not used it in over a month. He reports difficulty grasping small objects due to neuropathy and carpal tunnel syndrome.       01/14/2024 XRAY right hand: Mildly displaced fracture involving little finger middle phalanx base.  Severe chronic osseous abnormality of the middle phalanx likely reflecting sequela of remote/chronic osteomyelitis. There is soft tissue swelling throughout the little finger; if there is clinical concern for acute osteomyelitis,        Allergies:  Patient has no known allergies.    Medications:   Current antibiotics:  Anti-infectives (720h ago through 24h from now)      None            Previous antibiotics:    Current/Prior immunomodulators:    Current Outpatient Medications   Medication Instructions    atorvastatin  (LIPITOR ) 40 mg, Oral, Daily (standard)    chlorthalidone  (HYGROTON ) 25 mg, Oral, Every morning    ciprofloxacin HCl (CIPRO) 750 mg, Oral, 2 times a day (standard)    diaper,brief,adult,disposable Misc 1 each, Miscellaneous, 2 times a day    empagliflozin  (JARDIANCE ) 20 mg, Oral, Daily (standard)    empty container Misc Use as directed to dispose of Stelara  syringes.    flash glucose sensor (FREESTYLE LIBRE 14 DAY SENSOR) kit Apply new sensor every 14 days and use as directed to monitor and check blood sugars    fluocinolone  acetonide oil 0.01 % Drop Apply 2 -4 drops to ears twice a day    gabapentin  (NEURONTIN ) 800 mg, Oral, 4 times a day    hydrocortisone  2.5 % ointment APPLY TO PSORIASIS IN EARS UP TO TWICE DAILY AS NEEDED UNTIL SMOOTH    insulin  aspart (NOVOLOG  FLEXPEN U-100 INSULIN ) 10 Units, Subcutaneous, 3 times a day (AC)    linezolid (ZYVOX) 600 mg, Oral, Every 12 hours    NIFEdipine  (PROCARDIA  XL) 30 mg, Oral, Daily (standard)    omeprazole  (PRILOSEC) 20 mg, Oral, Daily (standard)    testosterone  20.25 mg/1.25 gram (1.62 %) gel pump PLACE 2 SPRAYS ON SKIN DAILY    TRULICITY  3 mg, Subcutaneous, Every 7 days    ustekinumab -kfce (YESINTEK ) 45 mg/0.5 mL Syrg Inject the contents of 1 syringe (45 mg total) under the skin every 12 weeks.       Medical History:  Past Medical History[1]    Surgical History:  Past Surgical History[2]    Social History:   reports that he has never smoked. He has never been exposed to tobacco smoke. He has never used smokeless tobacco. He reports current alcohol  use. He reports current drug use. Frequency: 7.00 times per week. Drug: Marijuana.     OBJECTIVE   BP 176/89 (BP Site: L Arm, BP Position: Sitting, BP Cuff Size: Medium)  - Pulse 84  - Temp 36.4 ??C (97.6 ??F) (Oral)  - Ht 172.7 cm (5' 8)  - Wt 83.8 kg (184 lb 12.8 oz)  - BMI 28.10 kg/m??   83.8 kg (184 lb 12.8 oz)  Ideal body weight: 68.4 kg (150 lb 12.7  oz)  Adjusted ideal body weight: 74.6 kg (164 lb 6.4 oz)    Physical Exam:    Constitutional:  Alert, NAD, interactive  HEENT:  Lids and Lashes normal, No scleral icterus  Pulm/Lungs: Unlabored respirations, symmetric chest wall movements, speaking in full sentences  Skin: Warm and dry without obvious rashes on clothed exam  Neuro: grossly non-focal, MAE x 4  Physical Exam    EXTREMITIES: Right thumb with swelling and mild drainage from pin sites. No swelling of the hand, localized swelling at right thumb. No cyanosis or edema. Culture collected but could not get much purulence out. Likely to be negative          Studies:   Pathology:  Final Diagnosis   Date Value Ref Range Status   08/25/2019   Final    A. Duodenum, biopsy: Normal appearing small bowel mucosa with no evidence of a villous abnormality, intraepithelial lymphocytosis, or parasite.     B. Transverse colon polyp, biopsy: Tubular adenoma    C. Sigmoid colon polyp, biopsy: Tubular adenoma  Electronically signed by Charlie JONETTA Louder, MD on 08/27/2019 at  9:51 AM       Diagnosis   Date Value Ref Range Status   10/06/2023   Final    Descending and sigmoid colon polyps, polypectomy:  - Sessile serrated adenoma x 2.    This electronic signature is attestation that the pathologist personally reviewed the submitted material(s) and the final diagnosis reflects that evaluation.          Microbiology:    Labs:  Lab Results   Component Value Date    WBC 9.3 01/14/2024    NEUTROABS 6.0 01/14/2024    LYMPHSABS 1.9 01/14/2024    EOSABS 0.5 01/14/2024    HGB 13.8 01/14/2024    HCT 41.7 01/14/2024    PLT 162 01/14/2024     Lab Results   Component Value Date    NA 139 01/14/2024    K 4.1 01/14/2024    CL 98 01/14/2024    CO2 28.8 01/14/2024    BUN 22 01/14/2024    CREATININE 0.81 01/14/2024 CALCIUM 9.9 01/14/2024    MG 2.2 02/23/2023    PHOS 3.4 10/15/2023     Lab Results   Component Value Date    ALKPHOS 75 01/14/2024    BILITOT 0.4 01/14/2024    BILIDIR 0.40 06/01/2018    PROT 7.8 01/14/2024    ALBUMIN 3.9 01/14/2024    ALT 50 (H) 01/14/2024    AST 41 (H) 01/14/2024    GGT 1,007 (H) 05/28/2018     Lab Results   Component Value Date    ESR 68 (H) 01/14/2024    ESR 91 (H) 02/23/2023    CRP <5.0 01/14/2024    CRP 17.0 (H) 02/23/2023     Lab Results   Component Value Date/Time    HEPCAB Nonreactive 09/26/2020 10:56 AM    HEPBSAB Nonreactive 09/26/2020 10:56 AM    HEPBCAB Nonreactive 09/26/2020 10:56 AM    HEPAIGG Nonreactive 04/22/2018 04:00 AM     No results found for: HCVR, HCVRNA, HCVIU, HCVRNAIU  Lab Results   Component Value Date/Time    AST 41 (H) 01/14/2024 11:55 AM    AST 14 04/13/2020 12:18 PM    ALT 50 (H) 01/14/2024 11:55 AM    ALT 11 04/13/2020 12:18 PM    PLT 162 01/14/2024 11:55 AM    PLT 170 04/13/2020 12:18 PM    ALBUMIN 3.9 01/14/2024 11:55  AM    ALBUMIN 3.5 04/13/2020 12:18 PM    BILITOT 0.4 01/14/2024 11:55 AM    BILITOT 0.5 04/13/2020 12:18 PM    INR 1.02 07/24/2021 12:26 AM    INR 1.0 05/01/2019 06:11 PM    CREATININE 0.81 01/14/2024 11:55 AM    CREATININE 1.0 04/13/2020 12:18 PM       HIV  No results found for: HIVAGAB, ACD4, CD4, HIVRS, HIVCP    Renal  Lab Results   Component Value Date/Time    CREATININE 0.81 01/14/2024 11:55 AM    CREATININE 1.0 04/13/2020 12:18 PM     Lab Results   Component Value Date/Time    GLUCOSEU Trace (A) 03/30/2019 03:53 AM    PROTEINUA 1+ (A) 03/30/2019 03:53 AM    PCRATIOUR 0.562 06/12/2022 10:17 AM       Radiology:  XR Finger 2 Or More Views Right  Result Date: 01/14/2024   Impression: Mildly displaced fracture involving little finger middle phalanx base. Severe chronic osseous abnormality of the middle phalanx likely reflecting sequela of remote/chronic osteomyelitis. There is soft tissue swelling throughout the little finger; if there is clinical concern for acute osteomyelitis, further evaluation with MRI may be warranted. Radiographic evaluation for acute osteomyelitis in this case is limited due to the lack of interval exams from 2019 and the underlying severe chronic osseous changes.      Results communicated via Epic fax to patient's primary care provider Cory, Verneita Molt, FNP and referring physician Information Unavailable*     Cory Dorothyann Sink, MD PhD  [facility] Infectious Diseases [facility]   [place], [place]  Phone: [phone]   Fax: [phone]              [1]   Past Medical History:  Diagnosis Date    Cellulitis of left foot 07/24/2021    Diabetes mellitus    (CMS-HCC)     Gastroparesis due to DM    (CMS-HCC) 09/09/2019    Hypertension     Psoriasis     Substance abuse (CMS-HCC)    [2]   Past Surgical History:  Procedure Laterality Date    PR ANESTH,CLEFT LIP REPAIR Right 04/20/2018    Procedure: DEBRIDEMENT; SKIN, SUBCUTANEOUS TISSUE, & MUSCLE HAND and FINGER wounds;  Surgeon: Logan Amaryllis Aliment, MD;  Location: MAIN OR [place];  Service: Plastics    PR APPLICATION UNIPLANE EXTERNAL FIXATION SYSTEM Right 10/23/2023    Procedure: UPPER EXTREMITY -APPLICATION OF A UNIPLANE (PINS OR WIRES IN 1 PLANE), UNILATERAL, EXTERNAL FIXATION SYSTEM;  Surgeon: Bonifacio Cory HERO, MD;  Location: OR [facility] [place];  Service: Orthopedics    PR APPLY FOREARM SPLINT,STATIC Right 05/05/2018    Procedure: application right short arm ulnar gutter splint;  Surgeon: Logan Amaryllis Aliment, MD;  Location: MAIN OR [place];  Service: Plastics    PR APPLY FOREARM SPLINT,STATIC Right 05/11/2018    Procedure: Applic Short Arm Splint; Static;  Surgeon: Eagen Gene Deune, MD;  Location: MAIN OR [place];  Service: Plastics    PR COLSC FLX W/RMVL OF TUMOR POLYP LESION SNARE TQ Left 10/06/2023    Procedure: COLONOSCOPY FLEX; W/REMOV TUMOR/LES BY SNARE;  Surgeon: Lucian Mikle Pfeiffer, MD;  Location: GI PROCEDURES [facility];  Service: Gastroenterology    PR DEBRIDEMENT MUSCLE &/FASCIA 1ST 20 SQ CM/< Right 04/27/2018    Procedure: debridement right palmar wound and dorsal right small finger wound;  Surgeon: Logan Amaryllis Aliment, MD;  Location: MAIN OR [place];  Service: Plastics    PR FTH/GF FR  W/DIR CLSR F/C/C/M/N/AX/G/H/F 20SQCM/< Right 05/11/2018    Procedure: Full thickness skin graft to right palmar hand and right small finger wounds;  Surgeon: Logan Amaryllis Aliment, MD;  Location: MAIN OR [place];  Service: Plastics    PR NEGATIVE PRESSURE WOUND THERAPY DME <= 50 SQ CM Right 04/22/2018    Procedure: Right Hand Wound Vac Change;  Surgeon: Barnetta Everts, MD;  Location: MAIN OR [place];  Service: Plastics    PR SUB GRFT F/S/N/H/F/G/M/D /<100SCM /<1ST 25 SCM Right 05/05/2018    Procedure: application allograft human skin to right hand and right small finger wounds;  Surgeon: Logan Amaryllis Aliment, MD;  Location: MAIN OR [place];  Service: Plastics    PR UPPER GI ENDOSCOPY,DIAGNOSIS Left 10/06/2023    Procedure: UGI ENDO, INCLUDE ESOPHAGUS, STOMACH, & DUODENUM &/OR JEJUNUM; DX W/WO COLLECTION SPECIMN, BY BRUSH OR WASH;  Surgeon: Lucian Mikle Pfeiffer, MD;  Location: [facility] PROC[facility];  Service: Gastroenterology    PR WOUND PREP, PED, TRK/ARM/LG 1ST 100 CM Right 05/05/2018    Procedure: Debridement right hand and small finger wounds in preparation for skin grafting;  Surgeon: Logan Amaryllis Aliment, MD;  Location: MAIN OR [facility];  Service: Plastics    PR WOUND PREP, PED, TRK/ARM/LG 1ST 100 CM Right 05/11/2018    Procedure: Surg Preparation/Creation Of Recipient Site By Excision Of Open Wound/Burn Eschar/Scar, Or Incisional Release Of Scar Contracture, Trunk/Arms/Legs; 1st 100 Sq Cm Or 1% Body Area Of Infants & Children;  Surgeon: Logan Amaryllis Aliment, MD;  Location: MAIN OR [place];  Service: Plastics    Right toe amputation

## 2024-01-14 NOTE — Unmapped (Signed)
 ORTHOPAEDIC RETURN CLINIC NOTE     ASSESSMENT:  Cory Bentley is a 63 y.o. male status post digit widget placement, now with exam and xray concerning for osteomyelitis.  Date of surgery: 10/23/2023    PLAN:  Given the concern for osteo, I plan on taking patient to the OR tomorrow for debridement and to obtain cultures. He will also see ID today to discuss antibiotic coverage.  I explained that with diabetes this Cory Bentley of ongoing/postoperative infection is increased and may make treatment more difficult.  I explained that we will plan on debriding the osteomyelitis tomorrow in hopes that with targeted antibiotic therapy we can clear the infection and we can get the bone to heal.  He understands and wishes to proceed.      INTERIM HISTORY:  I saw the patient last week in occupational therapy and noticed increased swelling at the small finger.  At that time I suspected a possible pin site infection.  I prescribed Keflex  for 10 days, he has been compliant with the therapy.  He returns today for repeat evaluation with no improvement in the swelling.  He denies pain.  There has been some mild drainage from the pin sites.  An x-ray obtained today revealed evidence of some bony erosion within the middle phalanx.  I was also able to express a small amount of pus from the pin sites.    PHYSICAL EXAM:  General: Well developed, well nourished male in no apparent distress  Musculoskeletal: Small finger swollen.  No pain with manipulation.  No pain with palpation of the middle phalanx.  Small amount of purulence at the pin site.    IMAGING:  Reviewed with findings as noted above.

## 2024-01-15 ENCOUNTER — Ambulatory Visit: Admit: 2024-01-15 | Discharge: 2024-01-15 | Payer: Medicare (Managed Care)

## 2024-01-15 ENCOUNTER — Inpatient Hospital Stay: Admit: 2024-01-15 | Discharge: 2024-01-15 | Payer: Medicare (Managed Care)

## 2024-01-15 MED ORDER — OXYCODONE 5 MG TABLET
ORAL_TABLET | ORAL | 0 refills | 2.00000 days | Status: CP | PRN
Start: 2024-01-15 — End: 2024-01-15

## 2024-01-15 MED ORDER — CYCLOBENZAPRINE 5 MG TABLET
ORAL_TABLET | Freq: Three times a day (TID) | ORAL | 0 refills | 14.00000 days | Status: CP
Start: 2024-01-15 — End: 2024-01-29

## 2024-01-15 MED ORDER — ACETAMINOPHEN 500 MG TABLET
ORAL_TABLET | Freq: Three times a day (TID) | ORAL | 0 refills | 14.00000 days | Status: CP
Start: 2024-01-15 — End: 2024-01-15

## 2024-01-15 MED ADMIN — bupivacaine (PF) (MARCAINE) 0.5 % (5 mg/mL) 3.5 mL, lidocaine (XYLOCAINE) 10 mg/mL (1 %) 3.5 mL: @ 20:00:00 | Stop: 2024-01-15

## 2024-01-15 NOTE — Unmapped (Signed)
 Orthopaedic Surgery Operative Note (CSN: [id number])  Date of Surgery: 01/15/2024  Admit Date: 01/15/2024  Attending Physician: Cordella Hickman, MD      Preoperative Diagnosis: Osteomyelitis of finger of right hand    (CMS-HCC) [M86.9]    Postoperative Diagnosis: Osteomyelitis of finger of right hand    (CMS-HCC) [M86.9]    Procedure:  1.  Debridement of right small finger middle phalanx for osteomyelitis (CPT 26034)     Resident Physician(s): None    Anesthesia: Local (Surgeon Admin Only)     Antibiotics: None    Tourniquet time:   Total Tourniquet Time Documented:  Arm  (Right) - 23 minutes  Total: Arm  (Right) - 23 minutes  .    Estimated Blood Loss: Minimal     Complications: None    Specimens: Right small finger osteomyelitis - tissue culture and swab    Indications for Surgery:    Cory Bentley is a 63 y.o. 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  He developed a significant PIP joint flexion contracture and I subsequently placed a digit widget on the digit on 10/23/2023.  The digit widget was removed on 12/24/2023 after complete correction of the proximal interphalangeal joint contracture.  Following removal of the device he developed significant swelling of the digit that was not responsive to oral antibiotic therapy.  Imaging of the small finger revealed evidence of erosion within the middle phalanx.  Given this finding consistent with osteomyelitis the decision was made to proceed to the operating room today for debridement.  In addition he is seeing infectious disease to guide antibiotic therapy and duration.    Operative Findings:  See op note    Procedure:    The patient was identified in the preoperative holding area where the surgical site was marked with indelible ink. The patient was taken to the OR where a procedural timeout was called.  The patient's right upper extremity was prepped and draped in the usual sterile fashion. Prior to the start of the surgical procedure a second preoperative timeout was called which included verifying the correct patient, the correct operative site, and correct procedure.    We then commenced with our operation.  The arm was elevated and the tourniquet was inflated to 250 mmHg.     A 15 blade scalpel was used to make a dorsal longitudinal incision overlying the middle phalanx.  Blunt dissection was used to dissect in the subcutaneous tissue plane where a small amount of purulence was identified.  I then dissected deeper to the level of the dorsal cortex and noted a small defect allowing entrance into the medullary canal.  Culture swab was taken at this time.  I then debrided the osteomyelitic bone from the dorsum of the middle phalanx and removed any fibrinous debris within the medullary canal.  This tissue was sent for culture.  The wound was then irrigated with copious amounts of normal saline, closed with 4-0 nylon, sterile dressings were placed, and the patient was placed into an AlumaFoam splint.  The tourniquet was deflated following placement of sterile dressings with nice return of blood flow to the hand and all digits.  At the end of the case all sponge instrument needle counts were correct.  He was transported to the postoperative care unit in stable condition.    Post-op Plan/Instructions:     The patient will be discharged home.     Teaching Surgeon Attestation:  Cordella Hickman MD was present, scrubbed and an active participant for the entire procedure.      Cordella CHRISTELLA Hickman, MD   Date: 01/15/2024  Time: 5:08 PM

## 2024-01-16 NOTE — Unmapped (Signed)
 I contacted Cory Bentley to discuss his concerns regarding his post operative bandage. He said that his post operative splint and bandage fell off his right small finger on his way home from surgery yesterday. He said his finger also started bleeding and he was unable to stop the bleeding. He said he paged the orthopaedic resident on call twice yesterday evening but no one returned his call. He has applied a new bandage to his finger and the bleeding has stopped. I advised for him to come to our office today for a wound check and bandage application but he declined to come because he said he couldn't afford to pay for travel to [facility]. I expressed the importance of him keeping his finger well protected and bandaged to avoid the potential risk of developing a post operative infection. I explained to him that he is at a high risk of developing an infection the first few days after surgery. He verbalized understanding and assured me that he would keep his finger bandaged until his follow up appointment on 01/28/24 with [name].

## 2024-01-16 NOTE — Unmapped (Signed)
 Patient coordinated [facility] clinic post op wound check with clinic staff due to concerns that his right small finger dressing and splint had fallen off. Patient was greeted in the main waiting area and immediately expressed anger/frustration for multiple reasons: his splint falling off during his ride home from surgery yesterday, no response from the resident on call despite calling twice last night, delay in response this morning after leaving a voicemail, the inconvenience of having to drive back/forth to [facility] multiple days in a row. Clinic staff expressed understanding of the financial, physical, and emotional burden he is going through at this time with patient acknowledging and apologizing for his initial emotional response.     Patient was brought back to an exam room where he had a piece of gauze with elastic bandage wrapped around the right small finger, no splint, that was easily slipped off exposing the surgical site. No obvious signs of infection.  Iodine solution wash was performed on the finger which was then re-dressed using sterile technique with Xerofoam, rolled gauze, Alumafoam splint, and Coban that covered the entire small finger and wrapped through the hand and wrist. Patient verbalized that the Coban was not too tight around the digit and staff educated him on keeping the dressing clean and dry until his follow up appointment on 01/28/24.     Reviewed contact information and how to best get in contact with Dr. Jenean clinic staff. Cory Bentley verbalized understanding and appreciation of new dressing, noting it felt more secure.

## 2024-01-19 NOTE — Unmapped (Signed)
 Received EPIC chat from [facility] team stating patient is on the phone wanting to speak with clinic staff about his dressing that has fallen off again.  Patient was offered ACC/PC postop check today.  Patient arrived to [facility] with Coban intact through the hand however loose around the small finger with patient able to slip his finger/Alumafoam splint off. Patient was brought to an exam room where remaining dressing was removed, photo taken of incision site to upload to chart. No obvious signs of infection. Using sterile technique, new Xeroform and rolled gauze was applied. Webril and Orthoglass hand based ulnar gutter splint fitted with ACE wrap and Coban overwrap. Patient instructed to keep the splint clean and dry, avoid overuse of the hand. Patient will contact the office with any future concerns prior to his scheduled follow up visit on 10/15.

## 2024-01-20 MED ORDER — NALOXONE 4 MG/ACTUATION NASAL SPRAY
Freq: Once | NASAL | 0 refills | 0.00000 days | Status: CP | PRN
Start: 2024-01-20 — End: ?

## 2024-01-20 MED ORDER — OXYCODONE 5 MG TABLET
ORAL_TABLET | Freq: Four times a day (QID) | ORAL | 0 refills | 5.00000 days | Status: CP | PRN
Start: 2024-01-20 — End: ?

## 2024-01-20 NOTE — Unmapped (Signed)
 [place] [facility]  [place] OPAT (Outpatient Parenteral Antimicrobial Therapy) Program         Cory Bentley    ID RN spoke with patient on behalf of the ID MD.   Per ID Md the patient is to continue taking PO linezolid.   Per ID MD the patient is to STOP taking PO Cipro.  The patient verbalized understanding.     OPAT plan reviewed, questions answered, and return precautions given to patient. They verbalized understanding and agree with plan. Teach-Back method utilized during instructions to assess understanding.      Time spent on documentation and coordination of care: 1 Hour      [facility] OPAT Program  OPAT Program Phone: [phone]   OPAT Program Fax:  [phone]   [facility] ID Clinic Phone: [phone]

## 2024-01-22 NOTE — Unmapped (Signed)
 [place] [facility]  [place] OPAT (Outpatient Parenteral Antimicrobial Therapy) Program         Cory Bentley    5:40pm - attempted to reach patient.    Left voice message for patient stating appointment date and time and confirming the visit is to be in person due to need for labs and further assessment for outpatient HI start of care.      Informed the ID clinic address and appointment details in MyChart. Provided OPAT Program phone number requesting call back and confirm ID clinic visit scheduled for 01/26/24.    Time spent on documentation and coordination of care: 15 Minutes      [facility] OPAT Program  OPAT Program Phone: [phone]   OPAT Program Fax:  [phone]   [facility] [place] [facility] Phone: [phone]

## 2024-01-26 NOTE — Unmapped (Unsigned)
 INFECTIOUS DISEASES CLINIC  [address]  [place], [place]  [address]  P [phone]  F [phone]     Primary care provider: [name], FNP      [facility] Infectious Diseases Ambulatory Pre-Surgical Evaluation for OPAT (PrOPAT)     Cory Bentley is being seen in consultation at the request of Cory Bentley for presurgical evaluation and management of Right 5th Finger Osteomyelitis after pin placement and assessment of the patient for need for post-operative IV or PO antibiotics.    Assessment and Plan:  Problem requiring evaluation: Right fifth finger Osteomyelitis with hardware in place  Date of scheduled surgery: 01/15/24  Surgical attending and service line: [name], Ortho, Hand  Date of Initial Surgical evaluation for infection: 01/14/24  Is hardware present: Yes  Is hardware to be removed: unknown at this time  Available microbiologic date: None current. Previous 04/2018 Enterbacter cloacae/Serratia - Susc to quinolones,  Previous 03/2018 MSSA - right hand; 03/2017 MSSA- Left hand  Available imaging: Xray finger right with osteomyelitis  Evaluation for need for IV ABX vs Oral option: Plan for Oral Abx (right handed; No other support for IV at home)  Does patient have insurance: Yes  [place] [facility] Residence:  [facility]    Is the patient deemed a candidate for management by the [facility] OPAT program: Yes    TREATMENT:  Recommend Linezolid 600 mg PO BID and Ciprofloxacin 750 PO BID  14 days called in to pharmacy.  Patient instructed not to take any until the evening after surgery  Recommend stopping keflex  (hold any doses after noon 10/1)  Patient will NOT need first dose of antibiotics after surgery in the PACU    DIAGNOSTICS:  Cultures Requested: Aerobic and Anaerobic Culture  Monitor safety labs for toxicity of parenteral antibiotics with CBC w diff and CMP weekly   Will obtain baseline safety labs today   '  OPAT:  D/W OPAT team: ID pharmacist, RN, scheduling  OPAT Program RN [name]) spoke with the patient and discussed the processes and expectations in detail if IV antibiotics are needed and he is a candidate.   Assessment/Plan:     Right finger osteomyelitis after pin placement with infection of internal fixation device  Right finger osteomyelitis post-pin placement with persistent swelling and drainage. Previous Keflex  treatment ineffective. Scheduled for debridement and culture. Considering oral antibiotics post-surgery, with potential for IV if resistant bacteria. Previous MSSA infection susceptible to antibiotics. Discussed oral antibiotics' side effects, including GI upset, nausea, diarrhea, altered taste, black tongue, neuropathy, tendinitis. Linezolid may cause bone marrow suppression with prolonged use.  - Stop current antibiotics for accurate culture results.  - Proceed with debridement and culture collection tomorrow.  - Initiate oral antibiotics post-surgery: linezolid 1 pill twice daily and ciprofloxacin 1.5 pills twice daily.  - Monitor for side effects such as GI upset, nausea, diarrhea, altered taste, black tongue, neuropathy, and tendinitis.  - Schedule follow-up in one week to review culture results and adjust antibiotics if necessary.    Type 2 diabetes mellitus  Type 2 diabetes mellitus, well-controlled with A1c under 7, managed with Trulicity  and Jardiance . History of poor control with A1c of 14, leading to complications such as neuropathy.    Peripheral neuropathy and carpal tunnel syndrome  Peripheral neuropathy and carpal tunnel syndrome likely secondary to long-standing diabetes. Symptoms include difficulty grasping small objects. Potential for worsening neuropathy with linezolid treatment, requiring monitoring.  - Monitor for new numbness or tingling  in hands or feet during antibiotic treatment.           {No diagnosis found. (Refresh or delete this SmartLink)}      Recommendations communicated via shared medical record.    []  I personally spent 65 minutes face-to-face and non-face-to-face in the care of this patient, which includes all pre, intra, and post visit time on the date of service.  All documented time was specific to the E/M visit and does not include any procedures that may have been performed.  [x]  Billed by E&M  Disposition:  To be seen 1 week post-surgery for evaluation   No follow-ups on file.    Cory Dorothyann Sink, MD PhD  [facility] Division of Infectious Diseases  [address]  [place], [place]           SUBJECTIVE   Chief complaint: Evaluation for need for OPAT to start after scheduled surgical procedure for subacute or chronic infection    This is my first time seeing the patient.  All records from the hospitalization, including H&P, consult notes, discharge summaries, radiology, pathology, laboratory and microbiology results were reviewed in detail and are summarized in the HPI.    History of Present Illness:  Cory Bentley is a 63 y.o. male  History of Present Illness    Cory Bentley is a 63 year old male with right finger osteomyelitis who presents for urgent evaluation of antibiotic therapy prior to surgery. He was referred by Doctor Null for urgent evaluation prior to surgery.    He has right thumb osteomyelitis following pin placement, characterized by persistent swelling and mild drainage from the pin sites. The swelling has not improved despite a 10-day course of Keflex , and the patient noticed some mild drainage from the pin sites. The infection began after he nicked his nail while scraping, leading to the current condition.    He has a history of diabetes for approximately 20 years, which is currently well-controlled with an A1c under 7, managed with Trulicity  and Jardiance . 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 He also has a history of MSSA infection in his right hand in December 2019, which required hospitalization and antibiotic treatment.    He has a history of substance use but has been clean for over two years. He lives alone with his dog and engages in activities such as playing golf, attending concerts, and volunteering. 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    No fever, chills, nausea, vomiting, diarrhea, rashes, chest pain, or palpitations. No known antibiotic allergies or intolerances. He is currently taking Lipitor , Trulicity , Jardiance , and occasionally nortriptyline for sleep, though he has not used it in over a month. He reports difficulty grasping small objects due to neuropathy and carpal tunnel syndrome.       01/14/2024 XRAY right hand: Mildly displaced fracture involving little finger middle phalanx base.  Severe chronic osseous abnormality of the middle phalanx likely reflecting sequela of remote/chronic osteomyelitis. There is soft tissue swelling throughout the little finger; if there is clinical concern for acute osteomyelitis,        Allergies:  Patient has no known allergies.    Medications:   Current antibiotics:  Anti-infectives (720h ago through 24h from now)      None            Previous antibiotics:    Current/Prior immunomodulators:    Current Outpatient Medications  Medication Instructions    acetaminophen  (TYLENOL ) 1,000 mg, Oral, Every 8 hours    atorvastatin  (LIPITOR ) 40 mg, Oral, Daily (standard)    chlorthalidone  (HYGROTON ) 25 mg, Oral, Every morning    ciprofloxacin HCl (CIPRO) 750 mg, Oral, 2 times a day (standard)    cyclobenzaprine (FLEXERIL) 5 mg, Oral, 3 times a day (standard)    diaper,brief,adult,disposable Misc 1 each, Miscellaneous, 2 times a day    empagliflozin  (JARDIANCE ) 20 mg, Oral, Daily (standard)    empty container Misc Use as directed to dispose of Stelara  syringes.    flash glucose sensor (FREESTYLE LIBRE 14 DAY SENSOR) kit Apply new sensor every 14 days and use as directed to monitor and check blood sugars    fluocinolone  acetonide oil 0.01 % Drop Apply 2 -4 drops to ears twice a day    gabapentin  (NEURONTIN ) 800 mg, Oral, 4 times a day    hydrocortisone  2.5 % ointment APPLY TO PSORIASIS IN EARS UP TO TWICE DAILY AS NEEDED UNTIL SMOOTH    insulin  aspart (NOVOLOG  FLEXPEN U-100 INSULIN ) 10 Units, Subcutaneous, 3 times a day (AC)    linezolid (ZYVOX) 600 mg, Oral, Every 12 hours    naloxone (NARCAN) 4 mg, Alternating Nares, Once as needed, One spray in either nostril once for known/suspected opioid overdose. May repeat every 2-3 minutes in alternating nostril til EMS arrives    NIFEdipine  (PROCARDIA  XL) 30 mg, Oral, Daily (standard)    omeprazole  (PRILOSEC) 20 mg, Oral, Daily (standard)    oxyCODONE  (ROXICODONE ) 5 mg, Oral, Every 6 hours PRN    testosterone  20.25 mg/1.25 gram (1.62 %) gel pump PLACE 2 SPRAYS ON SKIN DAILY    TRULICITY  3 mg, Subcutaneous, Every 7 days    ustekinumab -kfce (YESINTEK ) 45 mg/0.5 mL Syrg Inject the contents of 1 syringe (45 mg total) under the skin every 12 weeks.       Medical History:  Past Medical History[1]    Surgical History:  Past Surgical History[2]    Social History:   reports that he has never smoked. He has never been exposed to tobacco smoke. He has never used smokeless tobacco. He reports current alcohol  use. He reports current drug use. Frequency: 7.00 times per week. Drug: Marijuana.     OBJECTIVE   There were no vitals taken for this visit.     Ideal body weight: 68.4 kg (150 lb 12.7 oz)  Adjusted ideal body weight: 74.7 kg (164 lb 11.6 oz)    Physical Exam:    Constitutional:  Alert, NAD, interactive  HEENT:  Lids and Lashes normal, No scleral icterus  Pulm/Lungs: Unlabored respirations, symmetric chest wall movements, speaking in full sentences  Skin: Warm and dry without obvious rashes on clothed exam  Neuro: grossly non-focal, MAE x 4  Physical Exam    EXTREMITIES: Right thumb with swelling and mild drainage from pin sites. No swelling of the hand, localized swelling at right thumb. No cyanosis or edema. Culture collected but could not get much purulence out. Likely to be negative          Studies:   Pathology:  Final Diagnosis   Date Value Ref Range Status   08/25/2019   Final    A. Duodenum, biopsy: Normal appearing small bowel mucosa with no evidence of a villous abnormality, intraepithelial lymphocytosis, or parasite.     B. Transverse colon polyp, biopsy: Tubular adenoma    C. Sigmoid colon polyp, biopsy: Tubular adenoma  Electronically signed by Charlie JONETTA Louder, MD  on 08/27/2019 at  9:51 AM       Diagnosis   Date Value Ref Range Status   10/06/2023   Final    Descending and sigmoid colon polyps, polypectomy:  - Sessile serrated adenoma x 2.    This electronic signature is attestation that the pathologist personally reviewed the submitted material(s) and the final diagnosis reflects that evaluation.          Microbiology:    Labs:  Lab Results   Component Value Date    WBC 9.3 01/14/2024    NEUTROABS 6.0 01/14/2024    LYMPHSABS 1.9 01/14/2024    EOSABS 0.5 01/14/2024    HGB 13.8 01/14/2024    HCT 41.7 01/14/2024    PLT 162 01/14/2024     Lab Results   Component Value Date    NA 139 01/14/2024    K 4.1 01/14/2024    CL 98 01/14/2024    CO2 28.8 01/14/2024    BUN 22 01/14/2024    CREATININE 0.81 01/14/2024    CALCIUM 9.9 01/14/2024    MG 2.2 02/23/2023    PHOS 3.4 10/15/2023     Lab Results   Component Value Date    ALKPHOS 75 01/14/2024    BILITOT 0.4 01/14/2024    BILIDIR 0.40 06/01/2018    PROT 7.8 01/14/2024    ALBUMIN 3.9 01/14/2024    ALT 50 (H) 01/14/2024    AST 41 (H) 01/14/2024    GGT 1,007 (H) 05/28/2018     Lab Results   Component Value Date    ESR 68 (H) 01/14/2024    ESR 91 (H) 02/23/2023    CRP <5.0 01/14/2024    CRP 17.0 (H) 02/23/2023     Lab Results   Component Value Date/Time    HEPCAB Nonreactive 09/26/2020 10:56 AM    HEPBSAB Nonreactive 09/26/2020 10:56 AM    HEPBCAB Nonreactive 09/26/2020 10:56 AM    HEPAIGG Nonreactive 04/22/2018 04:00 AM     No results found for: HCVR, HCVRNA, HCVIU, HCVRNAIU  Lab Results Component Value Date/Time    AST 41 (H) 01/14/2024 11:55 AM    AST 14 04/13/2020 12:18 PM    ALT 50 (H) 01/14/2024 11:55 AM    ALT 11 04/13/2020 12:18 PM    PLT 162 01/14/2024 11:55 AM    PLT 170 04/13/2020 12:18 PM    ALBUMIN 3.9 01/14/2024 11:55 AM    ALBUMIN 3.5 04/13/2020 12:18 PM    BILITOT 0.4 01/14/2024 11:55 AM    BILITOT 0.5 04/13/2020 12:18 PM    INR 1.02 07/24/2021 12:26 AM    INR 1.0 05/01/2019 06:11 PM    CREATININE 0.81 01/14/2024 11:55 AM    CREATININE 1.0 04/13/2020 12:18 PM       HIV  No results found for: HIVAGAB, ACD4, CD4, HIVRS, HIVCP    Renal  Lab Results   Component Value Date/Time    CREATININE 0.81 01/14/2024 11:55 AM    CREATININE 1.0 04/13/2020 12:18 PM     Lab Results   Component Value Date/Time    GLUCOSEU Trace (A) 03/30/2019 03:53 AM    PROTEINUA 1+ (A) 03/30/2019 03:53 AM    PCRATIOUR 0.562 06/12/2022 10:17 AM       Radiology:  XR Finger 2 Or More Views Right  Result Date: 01/14/2024   Impression: Mildly displaced fracture involving little finger middle phalanx base. Severe chronic osseous abnormality of the middle phalanx likely reflecting sequela of remote/chronic osteomyelitis. There is soft tissue  swelling throughout the little finger; if there is clinical concern for acute osteomyelitis, further evaluation with MRI may be warranted. Radiographic evaluation for acute osteomyelitis in this case is limited due to the lack of interval exams from 2019 and the underlying severe chronic osseous changes.      Results communicated via Epic fax to patient's primary care provider Gayle, Verneita Molt, FNP and referring physician Cory LELON Lenon Cory Dorothyann Effie, MD PhD  [facility]   [place], [place]  Phone: [phone]   Fax: [phone]              [1]   Past Medical History:  Diagnosis Date    Cellulitis of left foot 07/24/2021    Diabetes mellitus (CMS-HCC)     Gastroparesis due to DM (CMS-HCC) 09/09/2019    Hypertension     Psoriasis Substance abuse (CMS-HCC)    [2]   Past Surgical History:  Procedure Laterality Date    PR ANESTH,CLEFT LIP REPAIR Right 04/20/2018    Procedure: DEBRIDEMENT; SKIN, SUBCUTANEOUS TISSUE, & MUSCLE HAND and FINGER wounds;  Surgeon: Logan Amaryllis Aliment, MD;  Location: MAIN OR [place];  Service: Plastics    PR APPLICATION UNIPLANE EXTERNAL FIXATION SYSTEM Right 10/23/2023    Procedure: UPPER EXTREMITY -APPLICATION OF A UNIPLANE (PINS OR WIRES IN 1 PLANE), UNILATERAL, EXTERNAL FIXATION SYSTEM;  Surgeon: Bonifacio Cordella HERO, MD;  Location: OR [facility];  Service: Orthopedics    PR APPLY FOREARM SPLINT,STATIC Right 05/05/2018    Procedure: application right short arm ulnar gutter splint;  Surgeon: Logan Amaryllis Aliment, MD;  Location: MAIN OR [place];  Service: Plastics    PR APPLY FOREARM SPLINT,STATIC Right 05/11/2018    Procedure: Applic Short Arm Splint; Static;  Surgeon: Logan Amaryllis Aliment, MD;  Location: MAIN OR [place];  Service: Plastics    PR COLSC FLX W/RMVL OF TUMOR POLYP LESION SNARE TQ Left 10/06/2023    Procedure: COLONOSCOPY FLEX; W/REMOV TUMOR/LES BY SNARE;  Surgeon: Lucian Mikle Pfeiffer, MD;  Location: GI PROCEDURES [facility];  Service: Gastroenterology    PR DEBRIDEMENT MUSCLE &/FASCIA 1ST 20 SQ CM/< Right 04/27/2018    Procedure: debridement right palmar wound and dorsal right small finger wound;  Surgeon: Logan Amaryllis Aliment, MD;  Location: MAIN OR [place];  Service: Plastics    PR FTH/GF FR W/DIR CLSR F/C/C/M/N/AX/G/H/F 20SQCM/< Right 05/11/2018    Procedure: Full thickness skin graft to right palmar hand and right small finger wounds;  Surgeon: Logan Amaryllis Aliment, MD;  Location: MAIN OR [facility];  Service: Plastics    PR INCIS DEEP FINGR/HAND BONE LESN Right 01/15/2024    Procedure: INCS DEEP W/OPEN BONE CORTEX HAND/FINGER;  Surgeon: Bonifacio Cordella HERO, MD;  Location: OR [facility] [place];  Service: Orthopedics    PR NEGATIVE PRESSURE WOUND THERAPY DME <= 50 SQ CM Right 04/22/2018    Procedure: Right Hand Wound Vac Change;  Surgeon: Barnetta Everts, MD;  Location: MAIN OR [place];  Service: Plastics    PR SUB GRFT F/S/N/H/F/G/M/D /<100SCM /<1ST 25 SCM Right 05/05/2018    Procedure: application allograft human skin to right hand and right small finger wounds;  Surgeon: Logan Amaryllis Aliment, MD;  Location: MAIN OR ;  Service: Plastics    PR UPPER GI ENDOSCOPY,DIAGNOSIS Left 10/06/2023    Procedure: UGI ENDO, INCLUDE ESOPHAGUS, STOMACH, & DUODENUM &/OR JEJUNUM; DX W/WO COLLECTION SPECIMN, BY BRUSH OR WASH;  Surgeon: Lucian Mikle Pfeiffer, MD;  Location: GI PROCEDURES [facility] [facility];  Service: Gastroenterology  PR WOUND PREP, PED, TRK/ARM/LG 1ST 100 CM Right 05/05/2018    Procedure: Debridement right hand and small finger wounds in preparation for skin grafting;  Surgeon: Logan Amaryllis Aliment, MD;  Location: MAIN OR [facility];  Service: Plastics    PR WOUND PREP, PED, TRK/ARM/LG 1ST 100 CM Right 05/11/2018    Procedure: Surg Preparation/Creation Of Recipient Site By Excision Of Open Wound/Burn Eschar/Scar, Or Incisional Release Of Scar Contracture, Trunk/Arms/Legs; 1st 100 Sq Cm Or 1% Body Area Of Infants & Children;  Surgeon: Logan Amaryllis Aliment, MD;  Location: MAIN OR [place];  Service: Plastics    Right toe amputation

## 2024-01-26 NOTE — Unmapped (Signed)
 [place] [facility]   OPAT (Outpatient Parenteral Antimicrobial Therapy) Program         Cory Bentley    Call from patient to cancel ID clinic visit today due to issues with gas money and request to only make one trip to [facility]. Has Ortho appointment on Wednesday 01/28/24 and asked if he needs ID visit. Explained to patient the need for safety labs to continue with antimicrobial plan.     Discussed with [facility], and re-explained ID provider managing his infection. Discussed importance of labs and clinic visits to assess patient for progression of care. Patient has agreed to ID visit on Wednesday 01/28/24 and will discuss ongoing antimicrobial therapy once labs are obtained. Confirmed patient is currently taking Linezolid and reported no issues while on therapy.      OPAT plan reviewed, questions answered, and return precautions given to patient. Verbalized understanding and agreed with plan. Teach-Back method utilized during instructions to assess understanding.      Time spent on documentation and coordination of care: 45 Minutes      [facility] OPAT Program  OPAT Program Phone: [phone]   OPAT Program Fax:  [phone]   [facility] ID Clinic Phone: [phone]

## 2024-01-28 ENCOUNTER — Encounter: Admit: 2024-01-28 | Discharge: 2024-01-28 | Payer: Medicare (Managed Care)

## 2024-01-28 ENCOUNTER — Ambulatory Visit: Admit: 2024-01-28 | Discharge: 2024-01-28 | Payer: Medicare (Managed Care)

## 2024-01-28 ENCOUNTER — Inpatient Hospital Stay: Admit: 2024-01-28 | Discharge: 2024-01-28 | Payer: Medicare (Managed Care)

## 2024-01-28 ENCOUNTER — Ambulatory Visit
Admit: 2024-01-28 | Discharge: 2024-01-28 | Payer: Medicare (Managed Care) | Attending: Plastic and Reconstructive Surgery | Primary: Plastic and Reconstructive Surgery

## 2024-01-28 DIAGNOSIS — Z9189 Other specified personal risk factors, not elsewhere classified: Principal | ICD-10-CM

## 2024-01-28 DIAGNOSIS — M869 Osteomyelitis, unspecified: Principal | ICD-10-CM

## 2024-01-28 DIAGNOSIS — Z23 Encounter for immunization: Principal | ICD-10-CM

## 2024-01-28 DIAGNOSIS — Z792 Long term (current) use of antibiotics: Principal | ICD-10-CM

## 2024-01-28 LAB — CBC W/ AUTO DIFF
BASOPHILS ABSOLUTE COUNT: 0.1 10*9/L (ref 0.0–0.1)
BASOPHILS RELATIVE PERCENT: 1.2 %
EOSINOPHILS ABSOLUTE COUNT: 0.4 10*9/L (ref 0.0–0.5)
EOSINOPHILS RELATIVE PERCENT: 5.7 %
HEMATOCRIT: 39.7 % (ref 39.0–48.0)
HEMOGLOBIN: 13.1 g/dL (ref 12.9–16.5)
LYMPHOCYTES ABSOLUTE COUNT: 2.3 10*9/L (ref 1.1–3.6)
LYMPHOCYTES RELATIVE PERCENT: 30.5 %
MEAN CORPUSCULAR HEMOGLOBIN CONC: 33.1 g/dL (ref 32.0–36.0)
MEAN CORPUSCULAR HEMOGLOBIN: 28.7 pg (ref 25.9–32.4)
MEAN CORPUSCULAR VOLUME: 86.8 fL (ref 77.6–95.7)
MEAN PLATELET VOLUME: 11.5 fL — ABNORMAL HIGH (ref 6.8–10.7)
MONOCYTES ABSOLUTE COUNT: 0.8 10*9/L (ref 0.3–0.8)
MONOCYTES RELATIVE PERCENT: 10.8 %
NEUTROPHILS ABSOLUTE COUNT: 3.9 10*9/L (ref 1.8–7.8)
NEUTROPHILS RELATIVE PERCENT: 51.8 %
PLATELET COUNT: 108 10*9/L — ABNORMAL LOW (ref 150–450)
RED BLOOD CELL COUNT: 4.57 10*12/L (ref 4.26–5.60)
RED CELL DISTRIBUTION WIDTH: 13.7 % (ref 12.2–15.2)
WBC ADJUSTED: 7.5 10*9/L (ref 3.6–11.2)

## 2024-01-28 LAB — BASIC METABOLIC PANEL
ANION GAP: 10 mmol/L (ref 5–14)
BLOOD UREA NITROGEN: 30 mg/dL — ABNORMAL HIGH (ref 9–23)
BUN / CREAT RATIO: 23
CALCIUM: 9.6 mg/dL (ref 8.7–10.4)
CHLORIDE: 95 mmol/L — ABNORMAL LOW (ref 98–107)
CO2: 31.2 mmol/L — ABNORMAL HIGH (ref 20.0–31.0)
CREATININE: 1.33 mg/dL — ABNORMAL HIGH (ref 0.73–1.18)
EGFR CKD-EPI (2021) MALE: 61 mL/min/1.73m2 (ref >=60)
GLUCOSE RANDOM: 153 mg/dL (ref 70–179)
POTASSIUM: 4.3 mmol/L (ref 3.4–4.8)
SODIUM: 136 mmol/L (ref 135–145)

## 2024-01-28 MED ORDER — DOXYCYCLINE HYCLATE 100 MG TABLET
ORAL_TABLET | Freq: Two times a day (BID) | ORAL | 0 refills | 28.00000 days | Status: CP
Start: 2024-01-28 — End: 2024-02-25

## 2024-01-28 NOTE — Unmapped (Signed)
 INFECTIOUS DISEASES CLINIC  [address]  [place], [place]  [address]  P [phone]  F [phone]     Primary care provider: [name], FNP      [facility] Infectious Diseases Ambulatory Pre-Surgical Evaluation for OPAT (PrOPAT)     Cory Bentley is being seen in consultation at the request of [name] for presurgical evaluation and management of Right 5th Finger Osteomyelitis after pin placement and assessment of the patient for need for post-operative IV or PO antibiotics.      Assessment and Plan for January 28, 2024:  [name] is a 63 y.o. year old male being seen in consultation by the [facility] Infectious Diseases' PrOPAT Program for R 5th digit osteomyelitis at the request of [name].   PrOPAT visit number: 2.  Date of surgery and procedure performed: 01/15/24, R 5th digit debridement performed.  Available microbiology reviewed: 2 out of 2 Preop or Op: Operative cultures demonstrating MSSA.  Antibiotic related complications: No..  Complication required change in therapy? No.  Non-antibiotic related complications: No.  Length of stay of index hospitalization: 0. (Zero if discharged the same day)  Did the patient require readmission? No. Reason: NA. Details: N/A.  Antibiotic Plan: CHANGE TO doxycycline 100mg  BID. Planned End of Treatment: 02/25/24.  Suppressive antibiotics required? No. Details: N/A.  Monitor safety labs for toxicity of parenteral antibiotics with CBC w diff and CMP, will check today in clinic  Will administer flu vaccine today in clinic              Diagnosis ICD-10-CM Associated Orders   1. Osteomyelitis of finger of right hand    (CMS-HCC)  M86.9 doxycycline (VIBRA-TABS) 100 MG tablet      2. Long term (current) use of antibiotics  Z79.2 Basic Metabolic Panel     CBC w/ Differential      3. Need for immunization against influenza  Z23 INFLUENZA VACCINE IIV3(IM)(PF)6 MOS UP     INFLUENZA VACCINE IIV3(IM)(PF)6 MOS UP      4. At risk for adverse drug event  Z91.89             Recommendations communicated via shared medical record.    [x]  I personally spent 56 minutes face-to-face and non-face-to-face in the care of this patient, which includes all pre, intra, and post visit time on the date of service.  All documented time was specific to the E/M visit and does not include any procedures that may have been performed.  []  Billed by E&M    Disposition:  Return in about 2 weeks (around 02/11/2024).    Fairy Bibber, MD  [facility] Division of Infectious Diseases  [address]  [place], [place]           SUBJECTIVE   Chief complaint: Evaluation for need for OPAT to start after scheduled surgical procedure for subacute or chronic infection    This is my first time seeing the patient.  All records from the hospitalization, including H&P, consult notes, discharge summaries, radiology, pathology, laboratory and microbiology results were reviewed in detail and are summarized in the HPI.    History of Present Illness:  Cory Bentley is a 63 y.o. male  History of Present Illness    Cory Bentley is a 63 year old male with right finger osteomyelitis who presents for urgent evaluation of antibiotic therapy prior to surgery. He was referred by Doctor Null for urgent evaluation prior to surgery.  He has right thumb osteomyelitis following pin placement, characterized by persistent swelling and mild drainage from the pin sites. The swelling has not improved despite a 10-day course of Keflex , and the patient noticed some mild drainage from the pin sites. The infection began after he nicked his nail while scraping, leading to the current condition.    He has a history of diabetes for approximately 20 years, which is currently well-controlled with an A1c under 7, managed with Trulicity  and Jardiance . 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 He also has a history of MSSA infection in his right hand in December 2019, which required hospitalization and antibiotic treatment.    He has a history of substance use but has been clean for over two years. He lives alone with his dog and engages in activities such as playing golf, attending concerts, and volunteering. 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    No fever, chills, nausea, vomiting, diarrhea, rashes, chest pain, or palpitations. No known antibiotic allergies or intolerances. He is currently taking Lipitor , Trulicity , Jardiance , and occasionally nortriptyline for sleep, though he has not used it in over a month. He reports difficulty grasping small objects due to neuropathy and carpal tunnel syndrome.       01/14/2024 XRAY right hand: Mildly displaced fracture involving little finger middle phalanx base.  Severe chronic osseous abnormality of the middle phalanx likely reflecting sequela of remote/chronic osteomyelitis. There is soft tissue swelling throughout the little finger; if there is clinical concern for acute osteomyelitis,      Interim HPI 01/28/24:    Reports feeling well overall. Had a diarrheal illness recently with poor appetite, fatigue, but has since recovered. No abdominal pain  Finger pain comes and goes, but is about the same overall since surgery  No pain elsewhere, no fevers, chills, N/V  Taking linezolid regularly    Also notes some blurriness in L eye  Corrects with his reading glasses  No eye pain    Allergies:  Patient has no known allergies.    Medications:   Current antibiotics:  Linezolid 10/2 -     Previous antibiotics:  Ciprofloxacin    Current/Prior immunomodulators:    Current Outpatient Medications   Medication Instructions    acetaminophen  (TYLENOL ) 1,000 mg, Oral, Every 8 hours    atorvastatin  (LIPITOR ) 40 mg, Oral, Daily (standard)    chlorthalidone  (HYGROTON ) 25 mg, Oral, Every morning    cyclobenzaprine (FLEXERIL) 5 mg, Oral, 3 times a day (standard)    doxycycline (VIBRA-TABS) 100 mg, Oral, 2 times a day (standard)    empagliflozin  (JARDIANCE ) 20 mg, Oral, Daily (standard)    empty container Misc Use as directed to dispose of Stelara  syringes.    flash glucose sensor (FREESTYLE LIBRE 14 DAY SENSOR) kit Apply new sensor every 14 days and use as directed to monitor and check blood sugars    fluocinolone  acetonide oil 0.01 % Drop Apply 2 -4 drops to ears twice a day    gabapentin  (NEURONTIN ) 800 mg, Oral, 4 times a day    hydrocortisone  2.5 % ointment APPLY TO PSORIASIS IN EARS UP TO TWICE DAILY AS NEEDED UNTIL SMOOTH    insulin  aspart (NOVOLOG  FLEXPEN U-100 INSULIN ) 10 Units, Subcutaneous, 3 times a day (AC)    naloxone (NARCAN) 4 mg, Alternating Nares, Once as needed, One spray in either nostril once for known/suspected opioid overdose. May repeat every 2-3  minutes in alternating nostril til EMS arrives    NIFEdipine  (PROCARDIA  XL) 30 mg, Oral, Daily (standard)    omeprazole  (PRILOSEC) 20 mg, Oral, Daily (standard)    oxyCODONE  (ROXICODONE ) 5 mg, Oral, Every 6 hours PRN    testosterone  20.25 mg/1.25 gram (1.62 %) gel pump PLACE 2 SPRAYS ON SKIN DAILY    TRULICITY  3 mg, Subcutaneous, Every 7 days    ustekinumab -kfce (YESINTEK ) 45 mg/0.5 mL Syrg Inject the contents of 1 syringe (45 mg total) under the skin every 12 weeks.       Medical History:  Past Medical History[1]    Surgical History:  Past Surgical History[2]    Social History:   reports that he has never smoked. He has never been exposed to tobacco smoke. He has never used smokeless tobacco. He reports current alcohol  use. He reports current drug use. Frequency: 7.00 times per week. Drug: Marijuana.     OBJECTIVE   BP 153/85 (BP Site: L Arm, BP Position: Sitting, BP Cuff Size: Large)  - Pulse 81  - Temp 36.3 ??C (97.4 ??F) (Temporal)  - Ht 172.7 cm (5' 8)  - Wt 84.1 kg (185 lb 6.4 oz)  - BMI 28.19 kg/m??   84.1 kg (185 lb 6.4 oz)  Ideal body weight: 68.4 kg (150 lb 12.7 oz)  Adjusted ideal body weight: 74.7 kg (164 lb 10.2 oz)    Physical Exam:    Constitutional:  Alert, NAD, interactive  HEENT:  Lids and Lashes normal, No scleral icterus, PERRLA  Pulm/Lungs: Unlabored respirations, symmetric chest wall movements, speaking in full sentences  Skin: Warm and dry without obvious rashes on clothed exam  Neuro: grossly non-focal, MAE x 4  EXTREMITIES: R 5th digit with well healing surgical site, sutures removed, no evident drainage, erythema, or tenderness.     Studies:   Pathology:  Final Diagnosis   Date Value Ref Range Status   08/25/2019   Final    A. Duodenum, biopsy: Normal appearing small bowel mucosa with no evidence of a villous abnormality, intraepithelial lymphocytosis, or parasite.     B. Transverse colon polyp, biopsy: Tubular adenoma    C. Sigmoid colon polyp, biopsy: Tubular adenoma  Electronically signed by Charlie JONETTA Louder, MD on 08/27/2019 at  9:51 AM       Diagnosis   Date Value Ref Range Status   10/06/2023   Final    Descending and sigmoid colon polyps, polypectomy:  - Sessile serrated adenoma x 2.    This electronic signature is attestation that the pathologist personally reviewed the submitted material(s) and the final diagnosis reflects that evaluation.          Microbiology:    Labs:  Lab Results   Component Value Date    WBC 7.5 01/28/2024    NEUTROABS 3.9 01/28/2024    LYMPHSABS 2.3 01/28/2024    EOSABS 0.4 01/28/2024    HGB 13.1 01/28/2024    HCT 39.7 01/28/2024    PLT 108 (L) 01/28/2024     Lab Results   Component Value Date    NA 139 01/14/2024    K 4.1 01/14/2024    CL 98 01/14/2024    CO2 28.8 01/14/2024    BUN 22 01/14/2024    CREATININE 0.81 01/14/2024    CALCIUM 9.9 01/14/2024    MG 2.2 02/23/2023    PHOS 3.4 10/15/2023     Lab Results   Component Value Date    ALKPHOS 75 01/14/2024  BILITOT 0.4 01/14/2024    BILIDIR 0.40 06/01/2018    PROT 7.8 01/14/2024    ALBUMIN 3.9 01/14/2024    ALT 50 (H) 01/14/2024    AST 41 (H) 01/14/2024    GGT 1,007 (H) 05/28/2018     Lab Results   Component Value Date    ESR 68 (H) 01/14/2024    ESR 91 (H) 02/23/2023    CRP <5.0 01/14/2024    CRP 17.0 (H) 02/23/2023     Lab Results   Component Value Date/Time    HEPCAB Nonreactive 09/26/2020 10:56 AM    HEPBSAB Nonreactive 09/26/2020 10:56 AM    HEPBCAB Nonreactive 09/26/2020 10:56 AM    HEPAIGG Nonreactive 04/22/2018 04:00 AM     No results found for: HCVR, HCVRNA, HCVIU, HCVRNAIU  Lab Results   Component Value Date/Time    AST 41 (H) 01/14/2024 11:55 AM    AST 14 04/13/2020 12:18 PM    ALT 50 (H) 01/14/2024 11:55 AM    ALT 11 04/13/2020 12:18 PM    PLT 108 (L) 01/28/2024 01:40 PM    PLT 170 04/13/2020 12:18 PM    ALBUMIN 3.9 01/14/2024 11:55 AM    ALBUMIN 3.5 04/13/2020 12:18 PM    BILITOT 0.4 01/14/2024 11:55 AM    BILITOT 0.5 04/13/2020 12:18 PM    INR 1.02 07/24/2021 12:26 AM    INR 1.0 05/01/2019 06:11 PM    CREATININE 0.81 01/14/2024 11:55 AM    CREATININE 1.0 04/13/2020 12:18 PM       HIV  No results found for: HIVAGAB, ACD4, CD4, HIVRS, HIVCP    Renal  Lab Results   Component Value Date/Time    CREATININE 0.81 01/14/2024 11:55 AM    CREATININE 1.0 04/13/2020 12:18 PM     Lab Results   Component Value Date/Time    GLUCOSEU Trace (A) 03/30/2019 03:53 AM    PROTEINUA 1+ (A) 03/30/2019 03:53 AM    PCRATIOUR 0.562 06/12/2022 10:17 AM       Radiology:  XR Finger 2 Or More Views Right  Result Date: 01/28/2024   Impression: Similar alignment of mildly displaced little finger middle phalanx base fracture. Mild increased osteolysis around the middle phalanx with persistent soft tissue swelling.    XR Finger 2 Or More Views Right  Result Date: 01/14/2024   Impression: Mildly displaced fracture involving little finger middle phalanx base. Severe chronic osseous abnormality of the middle phalanx likely reflecting sequela of remote/chronic osteomyelitis. There is soft tissue swelling throughout the little finger; if there is clinical concern for acute osteomyelitis, further evaluation with MRI may be warranted. Radiographic evaluation for acute osteomyelitis in this case is limited due to the lack of interval exams from 2019 and the underlying severe chronic osseous changes.      Results communicated via Epic fax to patient's primary care provider Gayle, Verneita Molt, FNP and referring physician Layman LELON Lenon Fairy Janelle, MD  [facility]   [place], [place]  Phone: [phone]   Fax: [phone]                    [1]   Past Medical History:  Diagnosis Date    Cellulitis of left foot 07/24/2021    Diabetes mellitus (CMS-HCC)     Gastroparesis due to DM (CMS-HCC) 09/09/2019    Hypertension     Psoriasis     Substance abuse (CMS-HCC)    [2]   Past Surgical  History:  Procedure Laterality Date    PR ANESTH,CLEFT LIP REPAIR Right 04/20/2018    Procedure: DEBRIDEMENT; SKIN, SUBCUTANEOUS TISSUE, & MUSCLE HAND and FINGER wounds;  Surgeon: Logan Amaryllis Aliment, MD;  Location: MAIN OR [place];  Service: Plastics    PR APPLICATION UNIPLANE EXTERNAL FIXATION SYSTEM Right 10/23/2023    Procedure: UPPER EXTREMITY -APPLICATION OF A UNIPLANE (PINS OR WIRES IN 1 PLANE), UNILATERAL, EXTERNAL FIXATION SYSTEM;  Surgeon: Bonifacio Cordella HERO, MD;  Location: OR [facility];  Service: Orthopedics    PR APPLY FOREARM SPLINT,STATIC Right 05/05/2018    Procedure: application right short arm ulnar gutter splint;  Surgeon: Logan Amaryllis Aliment, MD;  Location: MAIN OR [place];  Service: Plastics    PR APPLY FOREARM SPLINT,STATIC Right 05/11/2018    Procedure: Applic Short Arm Splint; Static;  Surgeon: Logan Amaryllis Aliment, MD;  Location: MAIN OR [place];  Service: Plastics    PR COLSC FLX W/RMVL OF TUMOR POLYP LESION SNARE TQ Left 10/06/2023    Procedure: COLONOSCOPY FLEX; W/REMOV TUMOR/LES BY SNARE;  Surgeon: Lucian Mikle Pfeiffer, MD;  Location: GI PROCEDURES [place] [facility];  Service: Gastroenterology    PR DEBRIDEMENT MUSCLE &/FASCIA 1ST 20 SQ CM/< Right 04/27/2018    Procedure: debridement right palmar wound and dorsal right small finger wound; Surgeon: Logan Amaryllis Aliment, MD;  Location: MAIN OR [place];  Service: Plastics    PR FTH/GF FR W/DIR CLSR F/C/C/M/N/AX/G/H/F 20SQCM/< Right 05/11/2018    Procedure: Full thickness skin graft to right palmar hand and right small finger wounds;  Surgeon: Logan Amaryllis Aliment, MD;  Location: MAIN OR [facility];  Service: Plastics    PR INCIS DEEP FINGR/HAND BONE LESN Right 01/15/2024    Procedure: INCS DEEP W/OPEN BONE CORTEX HAND/FINGER;  Surgeon: Bonifacio Cordella HERO, MD;  Location: OR [facility];  Service: Orthopedics    PR NEGATIVE PRESSURE WOUND THERAPY DME <= 50 SQ CM Right 04/22/2018    Procedure: Right Hand Wound Vac Change;  Surgeon: Barnetta Everts, MD;  Location: MAIN OR [place];  Service: Plastics    PR SUB GRFT F/S/N/H/F/G/M/D /<100SCM /<1ST 25 SCM Right 05/05/2018    Procedure: application allograft human skin to right hand and right small finger wounds;  Surgeon: Logan Amaryllis Aliment, MD;  Location: MAIN OR [place];  Service: Plastics    PR UPPER GI ENDOSCOPY,DIAGNOSIS Left 10/06/2023    Procedure: UGI ENDO, INCLUDE ESOPHAGUS, STOMACH, & DUODENUM &/OR JEJUNUM; DX W/WO COLLECTION SPECIMN, BY BRUSH OR WASH;  Surgeon: Lucian Mikle Pfeiffer, MD;  Location: [facility] PROCEDURES [place] [facility];  Service: Gastroenterology    PR WOUND PREP, PED, TRK/ARM/LG 1ST 100 CM Right 05/05/2018    Procedure: Debridement right hand and small finger wounds in preparation for skin grafting;  Surgeon: Logan Amaryllis Aliment, MD;  Location: MAIN OR [place];  Service: Plastics    PR WOUND PREP, PED, TRK/ARM/LG 1ST 100 CM Right 05/11/2018    Procedure: Surg Preparation/Creation Of Recipient Site By Excision Of Open Wound/Burn Eschar/Scar, Or Incisional Release Of Scar Contracture, Trunk/Arms/Legs; 1st 100 Sq Cm Or 1% Body Area Of Infants & Children;  Surgeon: Logan Amaryllis Aliment, MD;  Location: MAIN OR [place];  Service: Plastics    Right toe amputation

## 2024-01-28 NOTE — Unmapped (Signed)
 Thank you for trusting [facility] Infectious Diseases with your care!           Please note that your laboratory and other results may be visible to you in real time, possibly before they reach your provider. Please allow 48 hours for clinical interpretation of these results. Importantly, even if a result is flagged as abnormal, it may not be one that impacts your health.    The ID clinic phone number is [phone] (toll free [phone]).  The ID clinic fax number is [phone].    For urgent issues on nights and weekends you may reach the ID Physician on call through the [facility] Operator at [phone].     Fairy Bibber, MD    [facility] Infectious Diseases Clinic at [facility]  [address]   [place], [place] [address]  Phone: [phone]   Fax: [phone]

## 2024-01-28 NOTE — Unmapped (Signed)
 ORTHOPAEDIC RETURN CLINIC NOTE     ASSESSMENT:  Cory Bentley is a 63 y.o. male status post debridement of right small finger middle phalanx for osteomyelitis   Date of surgery: 01/15/24    PLAN:  Okay to transition to a finger splint.  Okay to remove for showering.  He is seeing infectious disease today to finalize his antibiotic course.  Return in 1 month for repeat evaluation and imaging.    INTERIM HISTORY:  Doing well.  No new concerns.  Has noted improvement in the pain of the finger as well as the swelling.  He is taking linezolid as prescribed by infectious disease    PHYSICAL EXAM:  General: Well developed, well nourished male in no apparent distress  Musculoskeletal: Incision healing well.  No signs of infection.  Erythema and swelling of the finger much improved following debridement.    IMAGING:  3 views of the right small finger reviewed and notable for no interval increase in erosion of the middle phalanx.

## 2024-01-28 NOTE — Unmapped (Signed)
 Teaching Physician Note     I saw and evaluated Cory Bentley, participating in the key portions of the service. I reviewed the note by Dr. Janelle. I agree with the documented findings and plan.      I personally reviewed this patient's lab results independently, microbiology data independently, and current medications independently and I agree with the findings as reported. I have also reviewed plain films of hand dated 10/1 and 01/28/24.    Appears to have satisfactory healing of surgical site following debridement and washout, with pan-S MSSA on micro. Has been on linezolid and cipro, now just linezolid. Unfortunately exhibits some thrombocytopenia on CBC so we will have him complete remaining days of linezolid and then transition to doxycycline (anticipated end date 02/25/24).    Estefana Gum, MD, MPH   [facility] Infectious Diseases Clinic

## 2024-01-28 NOTE — Unmapped (Deleted)
 INFECTI[facility] DISEASES CLINIC  [address]  [place], [place]  [address]  P [phone]  F [phone]     Primary care provider: [name], FNP      {    Coding tips - Do not edit this text, it will delete upon signing of note!    Telephone visits [phone] for Physicians and APPs and [phone] for Non- Physician Clinicians)- Only use minutes on the phone to determine level of service.    Video visits [phone]) - Use either level of medical decision making just as an in-person visit OR time which includes both minutes on video and pre/post minutes to determine the level of service.      :[id number]}      Assessment/Plan:     Assessment & Plan      ID Problem List:  Right fifth finger Osteomyelitis with hardware in place  With hx of severe right small finger flexion contracture secondary to a deep space infection in 2020.  Later developed PIP joint contracture, requiring digit widget placement on 7/10, s/p removal 9/10, following by development of R 5th finger osteomyelitis.   01/14/2024 XR right hand: Mildly displaced fracture involving little finger middle phalanx base. Severe chronic osseous abnormality of the middle phalanx likely reflecting sequela of remote/chronic osteomyelitis.   Prior cx data includes 04/2018 Enterbacter cloacae/Serratia - Susc to quinolones,  Previous 03/2018 MSSA - right hand; 03/2017 MSSA- Left hand  10/1 seen by [facility] prior to surgery and started on linezolid 600mg  BID and ciprofloxacin 750mg  BID. IV regimen avoided due to lack of support at home and R handed nature.  10/2 underwent OR debridement. Dorsal cortical defect noted and debrided as was osteomyelitic cortex. Both cx w 1+ MSSA.  Today seen in Orthopedics clinic and wound felt to be healing well. XR with mildly increased osteolysis around middle phalanx  Since surgery reports ***    PLAN:  Start cefadroxil 1g BID  Stop linezolid, ciprofloxacin    DIAGNOSES AND ORDERS FOR THIS VISIT (01/28/2024): Diagnosis ICD-10-CM Associated Orders   1. Osteomyelitis of finger of right hand    (CMS-HCC)  M86.9       2. Long term (current) use of antibiotics  Z79.2           Disposition  Return in about 4 weeks (around 02/25/2024).    []  I personally spent *** minutes face-to-face and non-face-to-face in the care of this patient, which includes all pre, intra, and post visit time on the date of service.  All documented time was specific to the E/M visit and does not include any procedures that may have been performed.  or  []  Billed by E&M      Fairy Bibber, MD  [facility]  Division of Infectious Diseases          Subjective:       Admit Date: 01/15/24  Discharge Date: 01/15/24    All records from the hospitalization, including H&P, consult notes, discharge summaries, radiology, pathology, laboratory and microbiology results were reviewed in detail and are summarized in the HPI.    BACKGROUND HPI:  Cory Bentley is a 63 y.o. male with hx DM, recurrent osteomyelitis of both hands, peripheral neuropathy, who was seen by [facility] prior to planned debridement of R 5th digit osteomyelitis  on 10/2.     INTERIM HPI:  01/28/2024  History of Present Illness    Since his surgery patient reports ***  Results    Medications:  Antimicrobials:  Linezolid, ciprofloxacin 10/1 -     Other:  Current Medications[1]    Allergies:  {GSC Allergies:30421601}    Medical History and Surgical History:  Reviewed in EPIC    Objective:        There were no vitals taken for this visit.     Ideal body weight: 68.4 kg (150 lb 12.7 oz)  Adjusted ideal body weight: 74.7 kg (164 lb 11.6 oz)    Physical Exam    Constitutional:  Alert, NAD, interactive  HEENT:  Lids and Lashes normal, No scleral icterus  Pulm/Lungs: Unlabored respirations, symmetric chest wall movements, speaking in full sentences  Skin: Warm and dry without obvious rashes on clothed exam  Neuro: grossly non-focal, MAE x 4  ***      Pathology:  Final Diagnosis   Date Value Ref Range Status 08/25/2019   Final    A. Duodenum, biopsy: Normal appearing small bowel mucosa with no evidence of a villous abnormality, intraepithelial lymphocytosis, or parasite.     B. Transverse colon polyp, biopsy: Tubular adenoma    C. Sigmoid colon polyp, biopsy: Tubular adenoma  Electronically signed by Charlie JONETTA Louder, MD on 08/27/2019 at  9:51 AM       Diagnosis   Date Value Ref Range Status   10/06/2023   Final    Descending and sigmoid colon polyps, polypectomy:  - Sessile serrated adenoma x 2.    This electronic signature is attestation that the pathologist personally reviewed the submitted material(s) and the final diagnosis reflects that evaluation.          Labs:    Lab Results   Component Value Date    WBC 9.3 01/14/2024    NEUTROABS 6.0 01/14/2024    LYMPHSABS 1.9 01/14/2024    EOSABS 0.5 01/14/2024    HGB 13.8 01/14/2024    HCT 41.7 01/14/2024    PLT 162 01/14/2024       Lab Results   Component Value Date    NA 139 01/14/2024    K 4.1 01/14/2024    CL 98 01/14/2024    CO2 28.8 01/14/2024    BUN 22 01/14/2024    CREATININE 0.81 01/14/2024    CALCIUM 9.9 01/14/2024    MG 2.2 02/23/2023    PHOS 3.4 10/15/2023       Lab Results   Component Value Date    ALKPHOS 75 01/14/2024    BILITOT 0.4 01/14/2024    BILIDIR 0.40 06/01/2018    PROT 7.8 01/14/2024    ALBUMIN 3.9 01/14/2024    ALT 50 (H) 01/14/2024    AST 41 (H) 01/14/2024    GGT 1,007 (H) 05/28/2018       Lab Results   Component Value Date    ESR 68 (H) 01/14/2024    ESR 91 (H) 02/23/2023    CRP <5.0 01/14/2024    CRP 17.0 (H) 02/23/2023       Microbiology:    Lab Results   Component Value Date    ANACX <1+ Methicillin-Susceptible Staphylococcus aureus (A) 01/15/2024    ANACX 1+ Methicillin-Susceptible Staphylococcus aureus (A) 01/15/2024    ANACX 3+ Enterobacter cloacae complex (A) 04/20/2018    ANACX 2+ Serratia marcescens (A) 04/20/2018    LABGRAM 2+ Polymorphonuclear leukocytes 01/15/2024    LABGRAM No organisms seen 01/15/2024    LABGRAM 2+ Polymorphonuclear leukocytes 01/15/2024    LABGRAM No organisms seen 01/15/2024    AFBCX No Acid Fast Bacilli Detected 04/20/2018  LABFUNG NO FUNGUS ISOLATED 04/20/2018    LABFUNG No Fungus Isolated at 4 Weeks 03/24/2017    FUNGSTAIN NO FUNGI SEEN 04/20/2018    LABBLOO No Growth at 5 days 07/24/2021    LABBLOO No Growth at 5 days 07/24/2021    LABBLOO No Growth at 120 hrs. 04/03/2019    LABBLOO No Growth at 120 hrs. 04/03/2019    LABBLOO No Growth at 120 hrs. 03/26/2019    LABBLOO No Growth at 120 hrs. 03/26/2019    LABURIN NO GROWTH 05/27/2018       Serologies:  Lab Results   Component Value Date    Hep A IgG Nonreactive 04/22/2018    Hep B S Ab Nonreactive 09/26/2020    Hep B Core Total Ab Nonreactive 09/26/2020    Hepatitis C Ab Nonreactive 09/26/2020    Quantiferon Mitogen Minus Nil 9.97 01/07/2023    Quantiferon Antigen 1 minus Nil -0.01 01/07/2023       Radiology:  XR Finger 2 Or More Views Right  Result Date: 01/28/2024   Impression: Similar alignment of mildly displaced little finger middle phalanx base fracture. Mild increased osteolysis around the middle phalanx with persistent soft tissue swelling.    XR Finger 2 Or More Views Right  Result Date: 01/14/2024   Impression: Mildly displaced fracture involving little finger middle phalanx base. Severe chronic osseous abnormality of the middle phalanx likely reflecting sequela of remote/chronic osteomyelitis. There is soft tissue swelling throughout the little finger; if there is clinical concern for acute osteomyelitis, further evaluation with MRI may be warranted. Radiographic evaluation for acute osteomyelitis in this case is limited due to the lack of interval exams from 2019 and the underlying severe chronic osseous changes.        Coordination of Care:  I have communicated with the patient's primary care and specialty providers regarding this plan of care.         Fairy Bibber, MD  [facility]  Phone: [phone]   Fax: [phone]     Results communicated via Epic fax to patient's primary care provider [name], FNP          [1]   Current Outpatient Medications:     acetaminophen  (TYLENOL ) 500 MG tablet, Take 2 tablets (1,000 mg total) by mouth every eight (8) hours for 14 days., Disp: 84 tablet, Rfl: 0    atorvastatin  (LIPITOR ) 40 MG tablet, TAKE 1 TABLET(40 MG) BY MOUTH DAILY, Disp: 90 tablet, Rfl: 2    chlorthalidone  (HYGROTON ) 25 MG tablet, Take 1 tablet (25 mg total) by mouth every morning., Disp: 100 tablet, Rfl: 3    ciprofloxacin HCl (CIPRO) 500 MG tablet, Take 1.5 tablets (750 mg total) by mouth two (2) times a day for 14 days., Disp: 42 tablet, Rfl: 0    cyclobenzaprine (FLEXERIL) 5 MG tablet, Take 1 tablet (5 mg total) by mouth Three (3) times a day for 14 days., Disp: 42 tablet, Rfl: 0    diaper,brief,adult,disposable Misc, 1 each by Miscellaneous route two (2) times a day., Disp: 20 each, Rfl: 2    dulaglutide  (TRULICITY ) 3 mg/0.5 mL injection pen, Inject 0.5 mL (3 mg total) under the skin every seven (7) days., Disp: 2 mL, Rfl: 2    empagliflozin  (JARDIANCE ) 10 mg tablet, Take 2 tablets (20 mg total) by mouth daily., Disp: 180 tablet, Rfl: 2    empty container Misc, Use as directed to dispose of Stelara  syringes., Disp: 1 each, Rfl: 2  flash glucose sensor (FREESTYLE LIBRE 14 DAY SENSOR) kit, Apply new sensor every 14 days and use as directed to monitor and check blood sugars, Disp: 2 each, Rfl: 12    fluocinolone  acetonide oil 0.01 % Drop, Apply 2 -4 drops to ears twice a day, Disp: 20 mL, Rfl: 4    gabapentin  (NEURONTIN ) 800 MG tablet, Take 1 tablet (800 mg total) by mouth four (4) times a day., Disp: 270 tablet, Rfl: 2    hydrocortisone  2.5 % ointment, APPLY TO PSORIASIS IN EARS UP TO TWICE DAILY AS NEEDED UNTIL SMOOTH (Patient not taking: Reported on 12/02/2023), Disp: 28.35 g, Rfl: 0    insulin  aspart (NOVOLOG  FLEXPEN U-100 INSULIN ) 100 unit/mL (3 mL) injection pen, Inject 0.1 mL (10 Units total) under the skin Three (3) times a day before meals., Disp: 27 mL, Rfl: 3    linezolid (ZYVOX) 600 mg tablet, Take 1 tablet (600 mg total) by mouth every twelve (12) hours for 14 days., Disp: 28 tablet, Rfl: 0    naloxone (NARCAN) 4 mg nasal spray, 1 spray (4 mg total) into alternating nostrils once as needed for up to 1 dose. One spray in either nostril once for known/suspected opioid overdose. May repeat every 2-3 minutes in alternating nostril til EMS arrives, Disp: 1 each, Rfl: 0    NIFEdipine  (PROCARDIA  XL) 30 MG 24 hr tablet, Take 1 tablet (30 mg total) by mouth daily., Disp: 100 tablet, Rfl: 3    omeprazole  (PRILOSEC) 20 MG capsule, TAKE 1 CAPSULE(20 MG) BY MOUTH DAILY, Disp: 90 capsule, Rfl: 1    oxyCODONE  (ROXICODONE ) 5 MG immediate release tablet, Take 1 tablet (5 mg total) by mouth every six (6) hours as needed for pain., Disp: 20 tablet, Rfl: 0    testosterone  20.25 mg/1.25 gram (1.62 %) gel pump, PLACE 2 SPRAYS ON SKIN DAILY, Disp: 75 g, Rfl: 1    ustekinumab -kfce (YESINTEK ) 45 mg/0.5 mL Syrg, Inject the contents of 1 syringe (45 mg total) under the skin every 12 weeks., Disp: 2 mL, Rfl: 2

## 2024-02-03 NOTE — Unmapped (Unsigned)
 [facility]   [address] [place], [place] [address]   [phone]     This note is to let you know that Cory Bentley did not show for their scheduled Occupational Therapy appointment today.  Please contact me if you have any questions or concerns.     Thank you for this referral,      Signed: Olam Muslim, MS, OTR/L adjacent finger. 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 Patient requires skilled Occupational Therapy services for decreased range of motion, decreased strength, orthotic fit/management, impaired daily activities of living as appropriate.     Previous Level of Function: Pt was previously independent with all ADLs and IADLs.     CURRENT LEVEL OF FUNCTION  Social and Occupational: he had an infection in the R SF secondary to diabetes.  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  This patient has no co-morbidities affecting occupational performance.  A problem focused assessment was performed.  Please refer to Current level of function section for further details.     PLAN  Short Term Goals:  1. In 1 session, patient will perform home exercise program with need for cuing to max IND with ADLs and IADLs. (met)    Long Term Goals:   1. In 12 weeks, patient will perform upgraded home exercise program, to include progression to strengthening, independently to max IND with ADLs including bathing activities.  2. In 12 weeks, pt will demo functional fist to max IND with grasp and release of daily living tools such as those used for grooming activities.   3. In 12 weeks, patient will demo finger extension of 15 degrees to increase independence with I/ADLs.  4. In 12 weeks, patient will demonstrate an average grip strength that is 65% of the contralateral side (left) to max IND with I/ADLs, such as carrying/lifting heavy items around the home.  5. In 12 weeks, patient will self report =< 2/10 pain score with activities to max IND with I/ADLs, such as writing, lifting/carrying functional items.    OT  PLAN OF CARE:  Pt will participate in:  Self Care/Hometraining  Orthotic Fit/Management   Therapeutic Exercise  Therapeutic Activity   Neuromuscular Re-education  Ultrasound  Hot/Cold Pack  Electrical Stimulation  Iontophoresis  Orthotic/Prosthetic Measure and Fit   Joint Mobilization  Physical Performance Measure   Manual Therapy    Planned frequency and duration of treatment: every other week/ 12 weeks. Plan will be adjusted as necessary.     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               AROM (degrees) Date: 11/05/2023  Right Date: 11/05/2023  Left 11/26/23  RIght   Wrist      Extension/flexion      Radial/ulnar deviation      Forearm      Supination/pronation      Elbow      Ext/flex      Composite flex to [facility]   (cm lack) Full   full   Index      Middle      Ring      Small      Digit Extension 1 cm from [facility] to fingernail of SF  4.5 cm from the [facility]   Thumb  Opposition         Digit ROM R Small finger 11/26/23  R Small finger  12/24/23 R SF   01/07/24  *after heat and massage R SF   10/22   MCP ext/flex -65/95 -48/88 -50/88    PIP ext/flex -64/87 -30/2 -30/33    DIP ext/flex -68/80 -52/40 -8/10    TAM total   43      Grip and Pinch Strength Date:  Right Date:  Left   Gross Grip Strength (lbs)  Position 2 Male - age 14-64 R: 51-137 L: 27-116    Trial 1     Trial 2     Trial 3          Average         Other notes: 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 This is due to several factors including but not limited to hormonal fluctuations caused by natural aging, gender affirming care, the presence of underlying or undetected medial conditions, and the historically poor representation of several populations throughout normative testing. Therefore functional deficits should be prioritized when considering deficits or impairments.       Wound/Incision(s) or Scar: digit widget present    Edema: min    Educated pt on the need to perform active range of motion to allow adequate venous return and prevent stiffness.     01/07/24   7.7 cm circumference in the R SF    TREATMENT:  Daily report:  that it has been feeling swollen  Pain: Pain present?: yes; Intensity: not quantified this session /10. The pain happens when it is pulling     Manual Therapy (40 minutes):  Therapist performed retrograde massage to help reduce edema in the R SF. Therapist performed retrograde in the direction towards the body to simulate fluids away from the affected area and to help increase mobility. Patient was able to tolerate treatment.  Patient performed concurrently with PROM PIP flexion and full composite flexion and place and holds, with 10 seconds hold at end range  Patient provided with digit sleeve (size XL) to wear 10-15 minutes, throughout the day as needed. Was instructed in how to check capillary refill.    Post treatment Assessment: Patient continues to demonstrate moderate edema of the R SF with pin site still open and no signs of infection or drainage. Therapist notified MD for swelling, who came to the hand clinic and saw the digit. Patient was able to passively  flex the finger towards the palm at the end of the session. Patient provided with digit sleeve for edema management. Patient was unable to maintain place and hold positioning.    Plan: continue with ROM, edema management    Home Program:   ADDED: 01/07/24  - Seated Finger Composite Flexion Stretch - 3 x daily - 7 x weekly - 1 sets - 10 reps - 10-15 seconds hold  - Seated Edema Reduction Massage  - 3 x daily - 7 x weekly - 1 sets - 1 reps - 2-3 minutes hold    Access Code: [email or web address]  URL: [email or web address]  Date: 12/24/2023  Prepared by: Tawni Ruth    Program Notes  Do not do exercises if it causes pain greater than 3/10 (discomfort).    Exercises  - Seated Finger Composite Flexion Extension  - 4 x daily - 7 x weekly - 1 sets - 10 reps - 5 seconds hold  - Wrist Tendon Gliding  - 3 x daily - 7 x weekly - 1 sets - 10 reps - 3 seconds hold  - Seated Finger PIP AROM  - 3 x daily - 7 x weekly - 1 sets - 10 reps - 3 seconds hold  - PIP Extension with Reverse Blocking  - 3 x daily - 7 x weekly - 1 sets - 10 reps - 3 seconds hold  11/26/23 - Patient vended silicone silicone cap (size L)    Apply low to moderate heat 10 min prior to exercises for improved tissue extensibility  Initiated home training  Access Code: [email or web address]  URL: [email or web address]  Date: 11/05/2023  Prepared by: Consepcion Cleaves    Program Notes  Do not do exercises if it causes pain greater than 3/10 (discomfort).    Exercises  - Seated Finger Composite Flexion Extension  - 4 x daily - 7 x weekly - 1 sets - 10 reps - 5 seconds hold    I reviewed the no-show/attendance policy with the patient and caregiver(s). The family is aware that they must call to cancel appointments more than 24 hours in advance. They are also aware that if they late cancel or no-show three times, we reserve the right to cancel their remaining appointments. This policy is in place to allow us  to best serve the needs of our caseload.    Treatment Rendered:   Manual Therapy Techniques: 40 min      Total Treatment Time:  40 minutes    Patient Education:  Topics: home program, disease process  Education Provided to: patient  Education Type: education, demonstration, literature  Response to education/teachback: verbal understanding received, return demonstration    PAST MEDICAL HISTORY:  Reviewed   Past Medical History[1]    Past Surgical History: Reviewed  Past Surgical History[2]    Allergies: Reviewed  Patient has no known allergies.    Medications: Reviewed  Current Medications[3]    I attest that I have reviewed the above information.  Signed: Olam CHRISTELLA Muslim, OT  02/04/2024 7:55 AM           [1]   Past Medical History:  Diagnosis Date    Cellulitis of left foot 07/24/2021    Diabetes mellitus (CMS-HCC)     Gastroparesis due to DM (CMS-HCC) 09/09/2019    Hypertension     Psoriasis     Substance abuse (CMS-HCC)    [2]   Past Surgical History:  Procedure Laterality Date    PR ANESTH,CLEFT LIP REPAIR Right 04/20/2018  Procedure: DEBRIDEMENT; SKIN, SUBCUTANEOUS TISSUE, & MUSCLE HAND and FINGER wounds;  Surgeon: Logan Amaryllis Aliment, MD;  Location: MAIN OR [place];  Service: Plastics    PR APPLICATION UNIPLANE EXTERNAL FIXATION SYSTEM Right 10/23/2023    Procedure: UPPER EXTREMITY -APPLICATION OF A UNIPLANE (PINS OR WIRES IN 1 PLANE), UNILATERAL, EXTERNAL FIXATION SYSTEM;  Surgeon: Bonifacio Cordella HERO, MD;  [facility]: [facility];  Service: Orthopedics    PR APPLY FOREARM SPLINT,STATIC Right 05/05/2018    Procedure: application right short arm ulnar gutter splint;  Surgeon: Logan Amaryllis Aliment, MD;  Location: MAIN OR [place];  Service: Plastics    PR APPLY FOREARM SPLINT,STATIC Right 05/11/2018    Procedure: Applic Short Arm Splint; Static;  Surgeon: Logan Amaryllis Aliment, MD;  Location: MAIN OR [place];  Service: Plastics    PR COLSC FLX W/RMVL OF TUMOR POLYP LESION SNARE TQ Left 10/06/2023    Procedure: COLONOSCOPY FLEX; W/REMOV TUMOR/LES BY SNARE;  Surgeon: Lucian Mikle Pfeiffer, MD;  Location: [facility] PROCEDURES [facility];  Service: Gastroenterology    PR DEBRIDEMENT MUSCLE &/FASCIA 1ST 20 SQ CM/< Right 04/27/2018    Procedure: debridement right palmar wound and dorsal right small finger wound;  Surgeon: Logan Amaryllis Aliment, MD;  Location: MAIN OR [place];  Service: Plastics    PR FTH/GF FR W/DIR CLSR F/C/C/M/N/AX/G/H/F 20SQCM/< Right 05/11/2018    Procedure: Full thickness skin graft to right palmar hand and right small finger wounds;  Surgeon: Logan Amaryllis Aliment, MD;  Location: MAIN OR [place];  Service: Plastics    PR INCIS DEEP FINGR/HAND BONE LESN Right 01/15/2024    Procedure: INCS DEEP W/OPEN BONE CORTEX HAND/FINGER;  Surgeon: Bonifacio Cordella HERO, MD;  Location: OR [facility] [place];  Service: Orthopedics    PR NEGATIVE PRESSURE WOUND THERAPY DME <= 50 SQ CM Right 04/22/2018    Procedure: Right Hand Wound Vac Change;  Surgeon: Barnetta Everts, MD;  Location: MAIN OR [place];  Service: Plastics    PR SUB GRFT F/S/N/H/F/G/M/D /<100SCM /<1ST 25 SCM Right 05/05/2018    Procedure: application allograft human skin to right hand and right small finger wounds;  Surgeon: Logan Amaryllis Aliment, MD;  Location: MAIN OR [place];  Service: Plastics    PR UPPER GI ENDOSCOPY,DIAGNOSIS Left 10/06/2023    Procedure: UGI ENDO, INCLUDE ESOPHAGUS, STOMACH, & DUODENUM &/OR JEJUNUM; DX W/WO COLLECTION SPECIMN, BY BRUSH OR WASH;  Surgeon: Lucian Mikle Pfeiffer, MD;  [facility];  Service: Gastroenterology    PR WOUND PREP, PED, TRK/ARM/LG 1ST 100 CM Right 05/05/2018    Procedure: Debridement right hand and small finger wounds in preparation for skin grafting;  Surgeon: Logan Amaryllis Aliment, MD;  Location: [facility];  Service: Plastics    PR WOUND PREP, PED, TRK/ARM/LG 1ST 100 CM Right 05/11/2018    Procedure: Surg Preparation/Creation Of Recipient Site By Excision Of Open Wound/Burn Eschar/Scar, Or Incisional Release Of Scar Contracture, Trunk/Arms/Legs; 1st 100 Sq Cm Or 1% Body Area Of Infants & Children;  Surgeon: Eagen Gene Deune, MD;  Location: MAIN OR [place];  Service: Plastics    Right toe amputation     [3]   Current Outpatient Medications:     atorvastatin  (LIPITOR ) 40 MG tablet, TAKE 1 TABLET(40 MG) BY MOUTH DAILY, Disp: 90 tablet, Rfl: 2    chlorthalidone  (HYGROTON ) 25 MG tablet, Take 1 tablet (25 mg total) by mouth every morning., Disp: 100 tablet, Rfl: 3    doxycycline (VIBRA-TABS) 100 MG tablet, Take 1 tablet (100 mg total) by mouth  two (2) times a day for 28 days., Disp: 56 tablet, Rfl: 0    dulaglutide  (TRULICITY ) 3 mg/0.5 mL injection pen, Inject 0.5 mL (3 mg total) under the skin every seven (7) days., Disp: 2 mL, Rfl: 2    empagliflozin  (JARDIANCE ) 10 mg tablet, Take 2 tablets (20 mg total) by mouth daily., Disp: 180 tablet, Rfl: 2    empty container Misc, Use as directed to dispose of Stelara  syringes., Disp: 1 each, Rfl: 2    flash glucose sensor (FREESTYLE LIBRE 14 DAY SENSOR) kit, Apply new sensor every 14 days and use as directed to monitor and check blood sugars, Disp: 2 each, Rfl: 12    fluocinolone  acetonide oil 0.01 % Drop, Apply 2 -4 drops to ears twice a day, Disp: 20 mL, Rfl: 4    gabapentin  (NEURONTIN ) 800 MG tablet, Take 1 tablet (800 mg total) by mouth four (4) times a day., Disp: 270 tablet, Rfl: 2    hydrocortisone  2.5 % ointment, APPLY TO PSORIASIS IN EARS UP TO TWICE DAILY AS NEEDED UNTIL SMOOTH, Disp: 28.35 g, Rfl: 0    insulin  aspart (NOVOLOG  FLEXPEN U-100 INSULIN ) 100 unit/mL (3 mL) injection pen, Inject 0.1 mL (10 Units total) under the skin Three (3) times a day before meals., Disp: 27 mL, Rfl: 3    naloxone (NARCAN) 4 mg nasal spray, 1 spray (4 mg total) into alternating nostrils once as needed for up to 1 dose. One spray in either nostril once for known/suspected opioid overdose. May repeat every 2-3 minutes in alternating nostril til EMS arrives (Patient not taking: Reported on 01/28/2024), Disp: 1 each, Rfl: 0    NIFEdipine  (PROCARDIA  XL) 30 MG 24 hr tablet, Take 1 tablet (30 mg total) by mouth daily., Disp: 100 tablet, Rfl: 3    omeprazole  (PRILOSEC) 20 MG capsule, TAKE 1 CAPSULE(20 MG) BY MOUTH DAILY, Disp: 90 capsule, Rfl: 1    oxyCODONE  (ROXICODONE ) 5 MG immediate release tablet, Take 1 tablet (5 mg total) by mouth every six (6) hours as needed for pain., Disp: 20 tablet, Rfl: 0    testosterone  20.25 mg/1.25 gram (1.62 %) gel pump, PLACE 2 SPRAYS ON SKIN DAILY, Disp: 75 g, Rfl: 1    ustekinumab -kfce (YESINTEK ) 45 mg/0.5 mL Syrg, Inject the contents of 1 syringe (45 mg total) under the skin every 12 weeks., Disp: 2 mL, Rfl: 2

## 2024-02-03 NOTE — Unmapped (Signed)
 Medical records request sent to our office. Forms placed upfront for completion

## 2024-02-11 NOTE — Progress Notes (Unsigned)
 INFECTIOUS DISEASES CLINIC  [address]  [place], [place]  [address]  P [phone]  F [phone]     Primary care provider: [name], FNP      [facility] Infectious Diseases Ambulatory Pre-Surgical Evaluation for OPAT (PrOPAT)     Cory Bentley is being seen in consultation at the request of Verneita Molt Gayle for presurgical evaluation and management of Right 5th Finger Osteomyelitis after pin placement and assessment of the patient for need for post-operative IV or PO antibiotics.      Assessment and Plan for January 28, 2024:  Cory Bentley is a 63 y.o. year old male being seen in consultation by the [facility] Infectious Diseases' PrOPAT Program for R 5th digit osteomyelitis at the request of Verneita Molt Gayle.   PrOPAT visit number: 3.  Date of surgery and procedure performed: 01/15/24, R 5th digit debridement performed.  Available microbiology reviewed: 2 out of 2 Preop or Op: Operative cultures demonstrating MSSA.  Antibiotic related complications: No.  Complication required change in therapy? No.  Non-antibiotic related complications: No.  Length of stay of index hospitalization: 0. (Zero if discharged the same day)  Did the patient require readmission? No. Reason: NA. Details: N/A.  Antibiotic Plan: CONTINUE doxycycline 100mg  BID. Planned End of Treatment: 02/25/24.  Suppressive antibiotics required? No. Details: N/A.  Monitor safety labs for toxicity of parenteral antibiotics with CBC w diff and CMP, will check today in clinic             {No diagnosis found. (Refresh or delete this SmartLink)}        Recommendations communicated via shared medical record.    [x]  I personally spent *** minutes face-to-face and non-face-to-face in the care of this patient, which includes all pre, intra, and post visit time on the date of service.  All documented time was specific to the E/M visit and does not include any procedures that may have been performed.  []  Billed by E&M    Disposition:  No follow-ups on file.    Fairy Bibber, MD  [facility] Division of Infectious Diseases  [address]  [place], [place]           SUBJECTIVE   Chief complaint: Evaluation for need for OPAT to start after scheduled surgical procedure for subacute or chronic infection    This is my first time seeing the patient.  All records from the hospitalization, including H&P, consult notes, discharge summaries, radiology, pathology, laboratory and microbiology results were reviewed in detail and are summarized in the HPI.    History of Present Illness:  Cory Bentley is a 63 y.o. male  History of Present Illness    Cory Bentley is a 63 year old male with right finger osteomyelitis who presents for urgent evaluation of antibiotic therapy prior to surgery. He was referred by Doctor Null for urgent evaluation prior to surgery.    He has right thumb osteomyelitis following pin placement, characterized by persistent swelling and mild drainage from the pin sites. The swelling has not improved despite a 10-day course of Keflex , and the patient noticed some mild drainage from the pin sites. The infection began after he nicked his nail while scraping, leading to the current condition.    He has a history of diabetes for approximately 20 years, which is currently well-controlled with an A1c under 7, managed with Trulicity  and Jardiance . 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  He also has a history of MSSA infection in his right hand in December 2019, which required hospitalization and antibiotic treatment.    He has a history of substance use but has been clean for over two years. He lives alone with his dog and engages in activities such as playing golf, attending concerts, and volunteering. 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    No fever, chills, nausea, vomiting, diarrhea, rashes, chest pain, or palpitations. No known antibiotic allergies or intolerances. He is currently taking Lipitor , Trulicity , Jardiance , and occasionally nortriptyline for sleep, though he has not used it in over a month. He reports difficulty grasping small objects due to neuropathy and carpal tunnel syndrome.       01/14/2024 XRAY right hand: Mildly displaced fracture involving little finger middle phalanx base.  Severe chronic osseous abnormality of the middle phalanx likely reflecting sequela of remote/chronic osteomyelitis. There is soft tissue swelling throughout the little finger; if there is clinical concern for acute osteomyelitis,      Interim HPI 01/28/24:    Reports feeling well overall. Had a diarrheal illness recently with poor appetite, fatigue, but has since recovered. No abdominal pain  Finger pain comes and goes, but is about the same overall since surgery  No pain elsewhere, no fevers, chills, N/V  Taking linezolid regularly    Also notes some blurriness in L eye  Corrects with his reading glasses  No eye pain    02/11/24:    ***    Allergies:  Patient has no known allergies.    Medications:   Current antibiotics:  Linezolid 10/2 -     Previous antibiotics:  Ciprofloxacin    Current/Prior immunomodulators:    Current Outpatient Medications   Medication Instructions    atorvastatin  (LIPITOR ) 40 mg, Oral, Daily (standard)    chlorthalidone  (HYGROTON ) 25 mg, Oral, Every morning    doxycycline (VIBRA-TABS) 100 mg, Oral, 2 times a day (standard)    empagliflozin  (JARDIANCE ) 20 mg, Oral, Daily (standard)    empty container Misc Use as directed to dispose of Stelara  syringes.    flash glucose sensor (FREESTYLE LIBRE 14 DAY SENSOR) kit Apply new sensor every 14 days and use as directed to monitor and check blood sugars    fluocinolone  acetonide oil 0.01 % Drop Apply 2 -4 drops to ears twice a day    gabapentin  (NEURONTIN ) 800 mg, Oral, 4 times a day    hydrocortisone  2.5 % ointment APPLY TO PSORIASIS IN EARS UP TO TWICE DAILY AS NEEDED UNTIL SMOOTH    insulin  aspart (NOVOLOG  FLEXPEN U-100 INSULIN ) 10 Units, Subcutaneous, 3 times a day (AC)    naloxone (NARCAN) 4 mg, Alternating Nares, Once as needed, One spray in either nostril once for known/suspected opioid overdose. May repeat every 2-3 minutes in alternating nostril til EMS arrives    NIFEdipine  (PROCARDIA  XL) 30 mg, Oral, Daily (standard)    omeprazole  (PRILOSEC) 20 mg, Oral, Daily (standard)    oxyCODONE  (ROXICODONE ) 5 mg, Oral, Every 6 hours PRN    testosterone  20.25 mg/1.25 gram (1.62 %) gel pump PLACE 2 SPRAYS ON SKIN DAILY    TRULICITY  3 mg, Subcutaneous, Every 7 days    ustekinumab -kfce (YESINTEK ) 45 mg/0.5 mL Syrg Inject the contents of 1 syringe (45 mg total) under the skin every 12 weeks.       Medical History:  Past Medical History[1]    Surgical History:  Past Surgical History[2]  Social History:   reports that he has never smoked. He has never been exposed to tobacco smoke. He has never used smokeless tobacco. He reports current alcohol  use. He reports current drug use. Frequency: 7.00 times per week. Drug: Marijuana.     OBJECTIVE   There were no vitals taken for this visit.     Ideal body weight: 68.4 kg (150 lb 12.7 oz)  Adjusted ideal body weight: 74.7 kg (164 lb 10.2 oz)    Physical Exam:    Constitutional:  Alert, NAD, interactive  HEENT:  Lids and Lashes normal, No scleral icterus, PERRLA  Pulm/Lungs: Unlabored respirations, symmetric chest wall movements, speaking in full sentences  Skin: Warm and dry without obvious rashes on clothed exam  Neuro: grossly non-focal, MAE x 4  EXTREMITIES: R 5th digit with well healing surgical site, sutures removed, no evident drainage, erythema, or tenderness.     Studies:   Pathology:  Final Diagnosis   Date Value Ref Range Status   08/25/2019   Final    A. Duodenum, biopsy: Normal appearing small bowel mucosa with no evidence of a villous abnormality, intraepithelial lymphocytosis, or parasite.     B. Transverse colon polyp, biopsy: Tubular adenoma    C. Sigmoid colon polyp, biopsy: Tubular adenoma  Electronically signed by Charlie JONETTA Louder, MD on 08/27/2019 at  9:51 AM       Diagnosis   Date Value Ref Range Status   10/06/2023   Final    Descending and sigmoid colon polyps, polypectomy:  - Sessile serrated adenoma x 2.    This electronic signature is attestation that the pathologist personally reviewed the submitted material(s) and the final diagnosis reflects that evaluation.          Microbiology:    Labs:  Lab Results   Component Value Date    WBC 7.5 01/28/2024    NEUTROABS 3.9 01/28/2024    LYMPHSABS 2.3 01/28/2024    EOSABS 0.4 01/28/2024    HGB 13.1 01/28/2024    HCT 39.7 01/28/2024    PLT 108 (L) 01/28/2024     Lab Results   Component Value Date    NA 136 01/28/2024    K 4.3 01/28/2024    CL 95 (L) 01/28/2024    CO2 31.2 (H) 01/28/2024    BUN 30 (H) 01/28/2024    CREATININE 1.33 (H) 01/28/2024    CALCIUM 9.6 01/28/2024    MG 2.2 02/23/2023    PHOS 3.4 10/15/2023     Lab Results   Component Value Date    ALKPHOS 75 01/14/2024    BILITOT 0.4 01/14/2024    BILIDIR 0.40 06/01/2018    PROT 7.8 01/14/2024    ALBUMIN 3.9 01/14/2024    ALT 50 (H) 01/14/2024    AST 41 (H) 01/14/2024    GGT 1,007 (H) 05/28/2018     Lab Results   Component Value Date    ESR 68 (H) 01/14/2024    ESR 91 (H) 02/23/2023    CRP <5.0 01/14/2024    CRP 17.0 (H) 02/23/2023     Lab Results   Component Value Date/Time    HEPCAB Nonreactive 09/26/2020 10:56 AM    HEPBSAB Nonreactive 09/26/2020 10:56 AM    HEPBCAB Nonreactive 09/26/2020 10:56 AM    HEPAIGG Nonreactive 04/22/2018 04:00 AM     No results found for: HCVR, HCVRNA, HCVIU, HCVRNAIU  Lab Results   Component Value Date/Time    AST 41 (H) 01/14/2024 11:55 AM  AST 14 04/13/2020 12:18 PM    ALT 50 (H) 01/14/2024 11:55 AM    ALT 11 04/13/2020 12:18 PM    PLT 108 (L) 01/28/2024 01:40 PM    PLT 170 04/13/2020 12:18 PM    ALBUMIN 3.9 01/14/2024 11:55 AM    ALBUMIN 3.5 04/13/2020 12:18 PM    BILITOT 0.4 01/14/2024 11:55 AM    BILITOT 0.5 04/13/2020 12:18 PM    INR 1.02 07/24/2021 12:26 AM    INR 1.0 05/01/2019 06:11 PM    CREATININE 1.33 (H) 01/28/2024 01:40 PM    CREATININE 1.0 04/13/2020 12:18 PM       HIV  No results found for: HIVAGAB, ACD4, CD4, HIVRS, HIVCP    Renal  Lab Results   Component Value Date/Time    CREATININE 1.33 (H) 01/28/2024 01:40 PM    CREATININE 1.0 04/13/2020 12:18 PM     Lab Results   Component Value Date/Time    GLUCOSEU Trace (A) 03/30/2019 03:53 AM    PROTEINUA 1+ (A) 03/30/2019 03:53 AM    PCRATIOUR 0.562 06/12/2022 10:17 AM       Radiology:  XR Finger 2 Or More Views Right  Result Date: 01/28/2024   Impression: Similar alignment of mildly displaced little finger middle phalanx base fracture. Mild increased osteolysis around the middle phalanx with persistent soft tissue swelling.    XR Finger 2 Or More Views Right  Result Date: 01/14/2024   Impression: Mildly displaced fracture involving little finger middle phalanx base. Severe chronic osseous abnormality of the middle phalanx likely reflecting sequela of remote/chronic osteomyelitis. There is soft tissue swelling throughout the little finger; if there is clinical concern for acute osteomyelitis, further evaluation with MRI may be warranted. Radiographic evaluation for acute osteomyelitis in this case is limited due to the lack of interval exams from 2019 and the underlying severe chronic osseous changes.      Results communicated via Epic fax to patient's primary care provider Gayle, Verneita Molt, FNP and referring physician Verneita Molt Gayle Fairy Janelle, MD  [facility]   [place], [place]  Phone: [phone]   Fax: [phone]                    [1]   Past Medical History:  Diagnosis Date    Cellulitis of left foot 07/24/2021    Diabetes mellitus (CMS-HCC)     Gastroparesis due to DM (CMS-HCC) 09/09/2019    Hypertension     Psoriasis     Substance abuse (CMS-HCC)    [2]   Past Surgical History:  Procedure Laterality Date PR ANESTH,CLEFT LIP REPAIR Right 04/20/2018    Procedure: DEBRIDEMENT; SKIN, SUBCUTANEOUS TISSUE, & MUSCLE HAND and FINGER wounds;  Surgeon: Logan Amaryllis Aliment, MD;  Location: MAIN OR [place];  Service: Plastics    PR APPLICATION UNIPLANE EXTERNAL FIXATION SYSTEM Right 10/23/2023    Procedure: UPPER EXTREMITY -APPLICATION OF A UNIPLANE (PINS OR WIRES IN 1 PLANE), UNILATERAL, EXTERNAL FIXATION SYSTEM;  Surgeon: Bonifacio Cordella HERO, MD;  Location: OR [facility];  Service: Orthopedics    PR APPLY FOREARM SPLINT,STATIC Right 05/05/2018    Procedure: application right short arm ulnar gutter splint;  Surgeon: Logan Amaryllis Aliment, MD;  Location: MAIN OR [place];  Service: Plastics    PR APPLY FOREARM SPLINT,STATIC Right 05/11/2018    Procedure: Applic Short Arm Splint; Static;  Surgeon: Logan Amaryllis Aliment, MD;  Location: MAIN OR [facility];  Service: Plastics  PR COLSC FLX W/RMVL OF TUMOR POLYP LESION SNARE TQ Left 10/06/2023    Procedure: COLONOSCOPY FLEX; W/REMOV TUMOR/LES BY SNARE;  Surgeon: Lucian Mikle Pfeiffer, MD;  Location: GI PROCEDURES [place] [facility];  Service: Gastroenterology    PR DEBRIDEMENT MUSCLE &/FASCIA 1ST 20 SQ CM/< Right 04/27/2018    Procedure: debridement right palmar wound and dorsal right small finger wound;  Surgeon: Logan Amaryllis Aliment, MD;  Location: MAIN OR [place];  Service: Plastics    PR FTH/GF FR W/DIR CLSR F/C/C/M/N/AX/G/H/F 20SQCM/< Right 05/11/2018    Procedure: Full thickness skin graft to right palmar hand and right small finger wounds;  Surgeon: Logan Amaryllis Aliment, MD;  Location: MAIN OR [facility];  Service: Plastics    PR INCIS DEEP FINGR/HAND BONE LESN Right 01/15/2024    Procedure: INCS DEEP W/OPEN BONE CORTEX HAND/FINGER;  Surgeon: Bonifacio Cordella HERO, MD;  Location: OR [facility] [place];  Service: Orthopedics    PR NEGATIVE PRESSURE WOUND THERAPY DME <= 50 SQ CM Right 04/22/2018    Procedure: Right Hand Wound Vac Change;  Surgeon: Barnetta Everts, MD;  Location: MAIN OR [place];  Service: Plastics    PR SUB GRFT F/S/N/H/F/G/M/D /<100SCM /<1ST 25 SCM Right 05/05/2018    Procedure: application allograft human skin to right hand and right small finger wounds;  Surgeon: Logan Amaryllis Aliment, MD;  Location: MAIN OR [place];  Service: Plastics    PR UPPER GI ENDOSCOPY,DIAGNOSIS Left 10/06/2023    Procedure: UGI ENDO, INCLUDE ESOPHAGUS, STOMACH, & DUODENUM &/OR JEJUNUM; DX W/WO COLLECTION SPECIMN, BY BRUSH OR WASH;  Surgeon: Lucian Mikle Pfeiffer, MD;  Location: GI PROCEDURES [place] [facility];  Service: Gastroenterology    PR WOUND PREP, PED, TRK/ARM/LG 1ST 100 CM Right 05/05/2018    Procedure: Debridement right hand and small finger wounds in preparation for skin grafting;  Surgeon: Logan Amaryllis Aliment, MD;  Location: MAIN OR [facility];  Service: Plastics    PR WOUND PREP, PED, TRK/ARM/LG 1ST 100 CM Right 05/11/2018    Procedure: Surg Preparation/Creation Of Recipient Site By Excision Of Open Wound/Burn Eschar/Scar, Or Incisional Release Of Scar Contracture, Trunk/Arms/Legs; 1st 100 Sq Cm Or 1% Body Area Of Infants & Children;  Surgeon: Logan Amaryllis Aliment, MD;  Location: MAIN OR [place];  Service: Plastics    Right toe amputation

## 2024-02-12 NOTE — Telephone Encounter (Signed)
 Medical Records request received and placed upfront in folder.

## 2024-02-16 ENCOUNTER — Ambulatory Visit: Admit: 2024-02-16 | Payer: Medicare (Managed Care)

## 2024-02-25 NOTE — Progress Notes (Unsigned)
 INFECTIOUS DISEASES CLINIC  [address]  [place], [place]  [address]  P [phone]  F [phone]     Primary care provider: [name], FNP      [facility] Infectious Diseases Ambulatory Pre-Surgical Evaluation for OPAT (PrOPAT)     Cory Bentley is being seen in consultation at the request of Verneita Molt Gayle for presurgical evaluation and management of Right 5th Finger Osteomyelitis after pin placement and assessment of the patient for need for post-operative IV or PO antibiotics.      Assessment and Plan for January 28, 2024:  Cory Bentley is a 63 y.o. year old male being seen in consultation by the [facility] Infectious Diseases' PrOPAT Program for R 5th digit osteomyelitis at the request of Verneita Molt Gayle.   PrOPAT visit number: 3.  Date of surgery and procedure performed: 01/15/24, R 5th digit debridement performed.  Available microbiology reviewed: 2 out of 2 Preop or Op: Operative cultures demonstrating MSSA.  Antibiotic related complications: No.  Complication required change in therapy? No.  Non-antibiotic related complications: No.  Length of stay of index hospitalization: 0. (Zero if discharged the same day)  Did the patient require readmission? No. Reason: NA. Details: N/A.  Antibiotic Plan: CONTINUE doxycycline 100mg  BID. Planned End of Treatment: 02/25/24.  Suppressive antibiotics required? No. Details: N/A.  Monitor safety labs for toxicity of parenteral antibiotics with CBC w diff and CMP, will check today in clinic              Diagnosis ICD-10-CM Associated Orders   1. Osteomyelitis of finger of right hand    (CMS-HCC)  M86.9       2. Long term (current) use of antibiotics  Z79.2       3. MSSA (methicillin susceptible Staphylococcus aureus) infection  A49.01               Recommendations communicated via shared medical record.    [x]  I personally spent *** minutes face-to-face and non-face-to-face in the care of this patient, which includes all pre, intra, and post visit time on the date of service.  All documented time was specific to the E/M visit and does not include any procedures that may have been performed.  []  Billed by E&M    Disposition:  No follow-ups on file.    [name], MD PhD  [facility] Division of Infectious Diseases  [address]  [place], [place]           SUBJECTIVE   Chief complaint: Evaluation for need for OPAT to start after scheduled surgical procedure for subacute or chronic infection    This is my first time seeing the patient.  All records from the hospitalization, including H&P, consult notes, discharge summaries, radiology, pathology, laboratory and microbiology results were reviewed in detail and are summarized in the HPI.    History of Present Illness:  Cory Bentley is a 63 y.o. male  History of Present Illness    Cory Bentley is a 63 year old male with right finger osteomyelitis who presents for urgent evaluation of antibiotic therapy prior to surgery. He was referred by Doctor Null for urgent evaluation prior to surgery.    He has right thumb osteomyelitis following pin placement, characterized by persistent swelling and mild drainage from the pin sites. The swelling has not improved despite a 10-day course of Keflex , and the patient noticed some mild drainage from the pin sites. The infection began after he nicked his  nail while scraping, leading to the current condition.    He has a history of diabetes for approximately 20 years, which is currently well-controlled with an A1c under 7, managed with Trulicity  and Jardiance . 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 He also has a history of MSSA infection in his right hand in December 2019, which required hospitalization and antibiotic treatment.    He has a history of substance use but has been clean for over two years. He lives alone with his dog and engages in activities such as playing golf, attending concerts, and volunteering. 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    No fever, chills, nausea, vomiting, diarrhea, rashes, chest pain, or palpitations. No known antibiotic allergies or intolerances. He is currently taking Lipitor , Trulicity , Jardiance , and occasionally nortriptyline for sleep, though he has not used it in over a month. He reports difficulty grasping small objects due to neuropathy and carpal tunnel syndrome.       01/14/2024 XRAY right hand: Mildly displaced fracture involving little finger middle phalanx base.  Severe chronic osseous abnormality of the middle phalanx likely reflecting sequela of remote/chronic osteomyelitis. There is soft tissue swelling throughout the little finger; if there is clinical concern for acute osteomyelitis,      Interim HPI 01/28/24:    Reports feeling well overall. Had a diarrheal illness recently with poor appetite, fatigue, but has since recovered. No abdominal pain  Finger pain comes and goes, but is about the same overall since surgery  No pain elsewhere, no fevers, chills, N/V  Taking linezolid regularly    Also notes some blurriness in L eye  Corrects with his reading glasses  No eye pain    02/11/24:    ***    Allergies:  Patient has no known allergies.    Medications:   Current antibiotics:  Linezolid 10/2 -     Previous antibiotics:  Ciprofloxacin    Current/Prior immunomodulators:    Current Outpatient Medications   Medication Instructions    atorvastatin  (LIPITOR ) 40 mg, Oral, Daily (standard)    chlorthalidone  (HYGROTON ) 25 mg, Oral, Every morning    doxycycline (VIBRA-TABS) 100 mg, Oral, 2 times a day (standard)    empagliflozin  (JARDIANCE ) 20 mg, Oral, Daily (standard)    empty container Misc Use as directed to dispose of Stelara  syringes.    flash glucose sensor (FREESTYLE LIBRE 14 DAY SENSOR) kit Apply new sensor every 14 days and use as directed to monitor and check blood sugars    fluocinolone  acetonide oil 0.01 % Drop Apply 2 -4 drops to ears twice a day    gabapentin  (NEURONTIN ) 800 mg, Oral, 4 times a day    hydrocortisone  2.5 % ointment APPLY TO PSORIASIS IN EARS UP TO TWICE DAILY AS NEEDED UNTIL SMOOTH    insulin  aspart (NOVOLOG  FLEXPEN U-100 INSULIN ) 10 Units, Subcutaneous, 3 times a day (AC)    naloxone (NARCAN) 4 mg, Alternating Nares, Once as needed, One spray in either nostril once for known/suspected opioid overdose. May repeat every 2-3 minutes in alternating nostril til EMS arrives    NIFEdipine  (PROCARDIA  XL) 30 mg, Oral, Daily (standard)    omeprazole  (PRILOSEC) 20 mg, Oral, Daily (standard)    oxyCODONE  (ROXICODONE ) 5 mg, Oral, Every 6 hours PRN    testosterone  20.25 mg/1.25 gram (1.62 %) gel pump PLACE 2 SPRAYS ON SKIN DAILY    TRULICITY  3 mg, Subcutaneous,  Every 7 days    ustekinumab -kfce (YESINTEK ) 45 mg/0.5 mL Syrg Inject the contents of 1 syringe (45 mg total) under the skin every 12 weeks.       Medical History:  Past Medical History[1]    Surgical History:  Past Surgical History[2]    Social History:   reports that he has never smoked. He has never been exposed to tobacco smoke. He has never used smokeless tobacco. He reports current alcohol  use. He reports current drug use. Frequency: 7.00 times per week. Drug: Marijuana.     OBJECTIVE   There were no vitals taken for this visit.     Ideal body weight: 68.4 kg (150 lb 12.7 oz)  Adjusted ideal body weight: 74.7 kg (164 lb 10.2 oz)    Physical Exam:    Constitutional:  Alert, NAD, interactive  HEENT:  Lids and Lashes normal, No scleral icterus, PERRLA  Pulm/Lungs: Unlabored respirations, symmetric chest wall movements, speaking in full sentences  Skin: Warm and dry without obvious rashes on clothed exam  Neuro: grossly non-focal, MAE x 4  EXTREMITIES: R 5th digit with well healing surgical site, sutures removed, no evident drainage, erythema, or tenderness.     Studies:   Pathology:  Final Diagnosis   Date Value Ref Range Status   08/25/2019   Final    A. Duodenum, biopsy: Normal appearing small bowel mucosa with no evidence of a villous abnormality, intraepithelial lymphocytosis, or parasite.     B. Transverse colon polyp, biopsy: Tubular adenoma    C. Sigmoid colon polyp, biopsy: Tubular adenoma  Electronically signed by Charlie JONETTA Louder, MD on 08/27/2019 at  9:51 AM       Diagnosis   Date Value Ref Range Status   10/06/2023   Final    Descending and sigmoid colon polyps, polypectomy:  - Sessile serrated adenoma x 2.    This electronic signature is attestation that the pathologist personally reviewed the submitted material(s) and the final diagnosis reflects that evaluation.          Microbiology:    Labs:  Lab Results   Component Value Date    WBC 7.5 01/28/2024    NEUTROABS 3.9 01/28/2024    LYMPHSABS 2.3 01/28/2024    EOSABS 0.4 01/28/2024    HGB 13.1 01/28/2024    HCT 39.7 01/28/2024    PLT 108 (L) 01/28/2024     Lab Results   Component Value Date    NA 136 01/28/2024    K 4.3 01/28/2024    CL 95 (L) 01/28/2024    CO2 31.2 (H) 01/28/2024    BUN 30 (H) 01/28/2024    CREATININE 1.33 (H) 01/28/2024    CALCIUM 9.6 01/28/2024    MG 2.2 02/23/2023    PHOS 3.4 10/15/2023     Lab Results   Component Value Date    ALKPHOS 75 01/14/2024    BILITOT 0.4 01/14/2024    BILIDIR 0.40 06/01/2018    PROT 7.8 01/14/2024    ALBUMIN 3.9 01/14/2024    ALT 50 (H) 01/14/2024    AST 41 (H) 01/14/2024    GGT 1,007 (H) 05/28/2018     Lab Results   Component Value Date    ESR 68 (H) 01/14/2024    ESR 91 (H) 02/23/2023    CRP <5.0 01/14/2024    CRP 17.0 (H) 02/23/2023     Lab Results   Component Value Date/Time    HEPCAB Nonreactive 09/26/2020 10:56 AM    HEPBSAB  Nonreactive 09/26/2020 10:56 AM    HEPBCAB Nonreactive 09/26/2020 10:56 AM    HEPAIGG Nonreactive 04/22/2018 04:00 AM     No results found for: HCVR, HCVRNA, HCVIU, HCVRNAIU  Lab Results   Component Value Date/Time    AST 41 (H) 01/14/2024 11:55 AM    AST 14 04/13/2020 12:18 PM    ALT 50 (H) 01/14/2024 11:55 AM    ALT 11 04/13/2020 12:18 PM    PLT 108 (L) 01/28/2024 01:40 PM    PLT 170 04/13/2020 12:18 PM    ALBUMIN 3.9 01/14/2024 11:55 AM    ALBUMIN 3.5 04/13/2020 12:18 PM    BILITOT 0.4 01/14/2024 11:55 AM    BILITOT 0.5 04/13/2020 12:18 PM    INR 1.02 07/24/2021 12:26 AM    INR 1.0 05/01/2019 06:11 PM    CREATININE 1.33 (H) 01/28/2024 01:40 PM    CREATININE 1.0 04/13/2020 12:18 PM       HIV  No results found for: HIVAGAB, ACD4, CD4, HIVRS, HIVCP    Renal  Lab Results   Component Value Date/Time    CREATININE 1.33 (H) 01/28/2024 01:40 PM    CREATININE 1.0 04/13/2020 12:18 PM     Lab Results   Component Value Date/Time    GLUCOSEU Trace (A) 03/30/2019 03:53 AM    PROTEINUA 1+ (A) 03/30/2019 03:53 AM    PCRATIOUR 0.562 06/12/2022 10:17 AM       Radiology:  XR Finger 2 Or More Views Right  Result Date: 01/28/2024   Impression: Similar alignment of mildly displaced little finger middle phalanx base fracture. Mild increased osteolysis around the middle phalanx with persistent soft tissue swelling.    XR Finger 2 Or More Views Right  Result Date: 01/14/2024   Impression: Mildly displaced fracture involving little finger middle phalanx base. Severe chronic osseous abnormality of the middle phalanx likely reflecting sequela of remote/chronic osteomyelitis. There is soft tissue swelling throughout the little finger; if there is clinical concern for acute osteomyelitis, further evaluation with MRI may be warranted. Radiographic evaluation for acute osteomyelitis in this case is limited due to the lack of interval exams from 2019 and the underlying severe chronic osseous changes.      Results communicated via Epic fax to patient's primary care provider Gayle, Verneita Molt, FNP and referring physician Verneita Molt Gayle Ronal Dorothyann Effie, MD PhD  [facility] Infectious Diseases Clinic   [place], [place]  Phone: [phone]   Fax: [phone]                    [1]   Past Medical History:  Diagnosis Date    Cellulitis of left foot 07/24/2021 Diabetes mellitus (CMS-HCC)     Gastroparesis due to DM (CMS-HCC) 09/09/2019    Hypertension     Psoriasis     Substance abuse (CMS-HCC)    [2]   Past Surgical History:  Procedure Laterality Date    PR ANESTH,CLEFT LIP REPAIR Right 04/20/2018    Procedure: DEBRIDEMENT; SKIN, SUBCUTANEOUS TISSUE, & MUSCLE HAND and FINGER wounds;  Surgeon: Logan Amaryllis Aliment, MD;  Location: MAIN OR [place];  Service: Plastics    PR APPLICATION UNIPLANE EXTERNAL FIXATION SYSTEM Right 10/23/2023    Procedure: UPPER EXTREMITY -APPLICATION OF A UNIPLANE (PINS OR WIRES IN 1 PLANE), UNILATERAL, EXTERNAL FIXATION SYSTEM;  Surgeon: Bonifacio Cordella HERO, MD;  [facility];  Service: Orthopedics    PR APPLY FOREARM SPLINT,STATIC Right 05/05/2018    Procedure: application right  short arm ulnar gutter splint;  Surgeon: Logan Amaryllis Aliment, MD;  Location: MAIN OR [place];  Service: Plastics    PR APPLY FOREARM SPLINT,STATIC Right 05/11/2018    Procedure: Applic Short Arm Splint; Static;  Surgeon: Eagen Gene Deune, MD;  Location: MAIN OR [place];  Service: Plastics    PR COLSC FLX W/RMVL OF TUMOR POLYP LESION SNARE TQ Left 10/06/2023    Procedure: COLONOSCOPY FLEX; W/REMOV TUMOR/LES BY SNARE;  Surgeon: Lucian Mikle Pfeiffer, MD;  Location: GI PROCEDURES [facility];  Service: Gastroenterology    PR DEBRIDEMENT MUSCLE &/FASCIA 1ST 20 SQ CM/< Right 04/27/2018    Procedure: debridement right palmar wound and dorsal right small finger wound;  Surgeon: Logan Amaryllis Aliment, MD;  Location: MAIN OR [place];  Service: Plastics    PR FTH/GF FR W/DIR CLSR F/C/C/M/N/AX/G/H/F 20SQCM/< Right 05/11/2018    Procedure: Full thickness skin graft to right palmar hand and right small finger wounds;  Surgeon: Logan Amaryllis Aliment, MD;  Location: MAIN OR [facility];  Service: Plastics    PR INCIS DEEP FINGR/HAND BONE LESN Right 01/15/2024    Procedure: INCS DEEP W/OPEN BONE CORTEX HAND/FINGER;  Surgeon: Bonifacio Cordella HERO, MD;  Location: OR [facility] [place];  Service: Orthopedics    PR NEGATIVE PRESSURE WOUND THERAPY DME <= 50 SQ CM Right 04/22/2018    Procedure: Right Hand Wound Vac Change;  Surgeon: Barnetta Everts, MD;  Location: MAIN OR [place];  Service: Plastics    PR SUB GRFT F/S/N/H/F/G/M/D /<100SCM /<1ST 25 SCM Right 05/05/2018    Procedure: application allograft human skin to right hand and right small finger wounds;  Surgeon: Logan Amaryllis Aliment, MD;  Location: MAIN OR [place];  Service: Plastics    PR UPPER GI ENDOSCOPY,DIAGNOSIS Left 10/06/2023    Procedure: UGI ENDO, INCLUDE ESOPHAGUS, STOMACH, & DUODENUM &/OR JEJUNUM; DX W/WO COLLECTION SPECIMN, BY BRUSH OR WASH;  Surgeon: Lucian Mikle Pfeiffer, MD;  [facility]: [facility];  Service: Gastroenterology    PR WOUND PREP, PED, TRK/ARM/LG 1ST 100 CM Right 05/05/2018    Procedure: Debridement right hand and small finger wounds in preparation for skin grafting;  Surgeon: Logan Amaryllis Aliment, MD;  Location: MAIN OR [facility];  Service: Plastics    PR WOUND PREP, PED, TRK/ARM/LG 1ST 100 CM Right 05/11/2018    Procedure: Surg Preparation/Creation Of Recipient Site By Excision Of Open Wound/Burn Eschar/Scar, Or Incisional Release Of Scar Contracture, Trunk/Arms/Legs; 1st 100 Sq Cm Or 1% Body Area Of Infants & Children;  Surgeon: Logan Amaryllis Aliment, MD;  Location: MAIN OR [place];  Service: Plastics    Right toe amputation

## 2024-02-26 ENCOUNTER — Ambulatory Visit
Admit: 2024-02-26 | Payer: Medicare (Managed Care) | Attending: Rehabilitative and Restorative Service Providers" | Primary: Rehabilitative and Restorative Service Providers"

## 2024-02-26 DIAGNOSIS — Z9189 Other specified personal risk factors, not elsewhere classified: Principal | ICD-10-CM

## 2024-02-26 DIAGNOSIS — M869 Osteomyelitis, unspecified: Principal | ICD-10-CM

## 2024-02-26 NOTE — Patient Instructions (Addendum)
 Thank you for trusting [facility] Infectious Diseases with your care!    VISIT SUMMARY:  You came in for a follow-up on your right fifth digit osteomyelitis and diabetes management. You are nearing the end of your doxycycline treatment and have reported improvements in your symptoms.    YOUR PLAN:  RIGHT FIFTH DIGIT OSTEOMYELITIS: You are being treated for an infection in your right fifth finger.  -Continue taking doxycycline for 7 more days (2 tablets daily).  -We have ordered blood tests to check your inflammation levels.  -Please report any worsening signs of infection, such as pain, swelling, or redness.  -Follow up with hand surgery with Dr. Bonifacio on November 19th.    TYPE 2 DIABETES MELLITUS: Your diabetes is an underlying condition that can affect healing and infection risk.  -No changes to your diabetes management were made today, but it remains an important factor in your overall health.      Please note that your laboratory and other results may be visible to you in real time, possibly before they reach your provider. Please allow 48 hours for clinical interpretation of these results. Importantly, even if a result is flagged as abnormal, it may not be one that impacts your health.    The ID clinic phone number is [phone] (toll free [phone]).  The ID clinic fax number is [phone].    For urgent issues on nights and weekends you may reach the ID Physician on call through the [facility] Operator at [phone].     [name], MD PhD    [facility] Infectious Diseases Clinic at [facility]  [address]   [place], [place] [address]  Phone: [phone]   Fax: [phone]  Thank you for entrusting [facility] Infectious Diseases with your care!    VISIT SUMMARY:  You came in for a follow-up on your right fifth digit osteomyelitis and diabetes management. You are nearing the end of your doxycycline treatment and have reported improvements in your symptoms.    YOUR PLAN:  RIGHT FIFTH DIGIT OSTEOMYELITIS: You are being treated for an infection in your right fifth finger.  -Continue taking doxycycline for 7 more days (2 tablets daily).  -We have ordered blood tests to check your inflammation levels.  -Please report any worsening signs of infection, such as pain, swelling, or redness.  -Follow up with hand surgery with Dr. Rulon on November 19th.    TYPE 2 DIABETES MELLITUS: Your diabetes is an underlying condition that can affect healing and infection risk.  -No changes to your diabetes management were made today, but it remains an important factor in your overall health.      Please note that your laboratory and other results may be visible to you in real time, possibly before they reach your provider. Please allow 48 hours for clinical interpretation of these results. Importantly, even if a result is flagged as abnormal, it may not be one that impacts your health.    [place] OPAT Program  OPAT Program Phone: [phone]   OPAT Program Fax:  [phone]   [facility] ID Clinic Phone: [phone] (toll free [phone]).    For urgent issues on nights and weekends you may reach the ID Physician on call through the [facility] Operator at [phone].     [name], MD PhD    [facility] Infectious Diseases Clinic at [facility]  [address]   [place], [place] [address]

## 2024-02-26 NOTE — Progress Notes (Signed)
  Infectious Disease at [facility] Checklist     Type of visit:  video    Are you located in [place]? yes    Reason for visit: f/u    Questions / Concerns that need to be addressed: n/a    General Consent to Treat (GCT) for epic video visits only: Verbal consent    HCDM reviewed and updated in Epic:    We are working to make sure all of our patients??? wishes are updated in Epic and part of that is documenting a Environmental Health Practitioner for each patient  A Health Care Decision Maker is someone you choose who can make health care decisions for you if you are not able - who would you most want to do this for you????  is already up to date.    HCDM (patient stated preference): Aldair, Rickel - Son - [phone]    HCDM, back-up (If primary HCDM is unavailable): Frett,James Dustin - Son - [phone]    COVID-19 Vaccine Summary  Which COVID-19 Vaccine was administered  Pfizer  Type:  Complete  Dates Given:                   If no: Are you interested in scheduling?

## 2024-02-26 NOTE — Progress Notes (Signed)
 INFECTIOUS DISEASES CLINIC  [address]  [place], [place]  [address]  P [phone]  F [phone]     Primary care provider: [name], FNP      The patient reports they are physically located in [place]  and is currently: at home. I conducted a audio/video visit. I spent  26m 38s on the video call with the patient. I spent an additional 25 minutes on pre- and post-visit activities on the date of service .     [facility] Pre-Surgical Evaluation for OPAT (PrOPAT)     Cory Bentley is being seen in consultation at the request of Dr. Cathlyn Hickman for presurgical evaluation and management of Right 5th Finger Osteomyelitis after pin placement and assessment of the patient for need for post-operative IV or PO antibiotics.      Assessment and Plan for March 01, 2024:  Cory Bentley is a 63 y.o. year old male being seen in consultation by the [facility] Infectious Diseases' PrOPAT Program for R 5th digit osteomyelitis  at the request of Dr. Hickman  PrOPAT visit number: 4.  Available microbiology reviewed: No new culture data  Antibiotic related complications: No..  Complication required change in therapy? No..  Non-antibiotic related complications: No..  Complete antibiotics 03/01/24              Diagnosis ICD-10-CM Associated Orders   1. Osteomyelitis of finger of right hand    (CMS-HCC)  M86.9 C-reactive protein     Sedimentation Rate      2. At risk for adverse drug event  Z91.89 C-reactive protein     Sedimentation Rate      3. Controlled type 2 diabetes mellitus with complication, with long-term current use of insulin  (CMS-HCC)  E11.8     Z79.4         Recommendations communicated via shared medical record.    []  I personally spent 30 minutes face-to-face and non-face-to-face in the care of this patient, which includes all pre, intra, and post visit time on the date of service.  All documented time was specific to the E/M visit and does not include any procedures that may have been performed.  [x]  Billed by E&M    Disposition:  Return if symptoms worsen or fail to improve.    Cory Dorothyann Sink, MD PhD  [facility] Division of Infectious Diseases  [address]  [place], [place]           SUBJECTIVE   Chief complaint: Evaluation for need for OPAT to start after scheduled surgical procedure for subacute or chronic infection    This is my first time seeing the patient.  All records from the hospitalization, including H&P, consult notes, discharge summaries, radiology, pathology, laboratory and microbiology results were reviewed in detail and are summarized in the HPI.    History of Present Illness:  Cory Bentley is a 63 y.o. male  History of Present Illness    Cory Bentley is a 63 year old male with right finger osteomyelitis who presents for urgent evaluation of antibiotic therapy prior to surgery. He was referred by Doctor Null for urgent evaluation prior to surgery.    He has right thumb osteomyelitis following pin placement, characterized by persistent swelling and mild drainage from the pin sites. The swelling has not improved despite a 10-day course of Keflex , and the patient noticed some mild drainage from the pin sites. The infection began after he nicked his nail while scraping,  leading to the current condition.    He has a history of diabetes for approximately 20 years, which is currently well-controlled with an A1c under 7, managed with Trulicity  and Jardiance . 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 He also has a history of MSSA infection in his right hand in December 2019, which required hospitalization and antibiotic treatment.    He has a history of substance use but has been clean for over two years. He lives alone with his dog and engages in activities such as playing golf, attending concerts, and volunteering. 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    No fever, chills, nausea, vomiting, diarrhea, rashes, chest pain, or palpitations. No known antibiotic allergies or intolerances. He is currently taking Lipitor , Trulicity , Jardiance , and occasionally nortriptyline for sleep, though he has not used it in over a month. He reports difficulty grasping small objects due to neuropathy and carpal tunnel syndrome.       01/14/2024 XRAY right hand: Mildly displaced fracture involving little finger middle phalanx base.  Severe chronic osseous abnormality of the middle phalanx likely reflecting sequela of remote/chronic osteomyelitis. There is soft tissue swelling throughout the little finger; if there is clinical concern for acute osteomyelitis,      Interim HPI 01/28/24:    Reports feeling well overall. Had a diarrheal illness recently with poor appetite, fatigue, but has since recovered. No abdominal pain  Finger pain comes and goes, but is about the same overall since surgery  No pain elsewhere, no fevers, chills, N/V  Taking linezolid regularly    Also notes some blurriness in L eye  Corrects with his reading glasses  No eye pain    02/11/24:    History of Present Illness    Cory Bentley is a 63 year old male with diabetes and right fifth digit osteomyelitis who presents for a ProPAP follow-up.    He is nearing the completion of a six-week course of oral doxycycline, taking two tablets daily, with fourteen tablets remaining. He has been adherent to the medication regimen without any missed doses. He experiences constipation as a side effect, which he finds unusual given the typical expectation of diarrhea with this medication.    He reports that the swelling in his right fifth digit appears to be coming down, and he is able to move it without pain. He engages in daily therapy exercises, although some occupational therapy appointments have been missed due to moving and other commitments.    He has underlying diabetes, which is relevant to his current condition. No recent lab work has been done, but his last CRP was normal.       Results    LABS:  C-reactive protein (CRP): normal         Allergies:  Patient has no known allergies.    Medications:   Current antibiotics:  Linezolid 10/2 -     Previous antibiotics:  Ciprofloxacin    Current/Prior immunomodulators:    Current Outpatient Medications   Medication Instructions    atorvastatin  (LIPITOR ) 40 mg, Oral, Daily (standard)    chlorthalidone  (HYGROTON ) 25 mg, Oral, Every morning    empagliflozin  (JARDIANCE ) 20 mg, Oral, Daily (standard)    empty container Misc Use as directed to dispose of Stelara  syringes.    flash glucose sensor (FREESTYLE LIBRE 14 DAY SENSOR) kit Apply new sensor every 14 days and use as directed to monitor  and check blood sugars    fluocinolone  acetonide oil 0.01 % Drop Apply 2 -4 drops to ears twice a day    gabapentin  (NEURONTIN ) 800 mg, Oral, 4 times a day    hydrocortisone  2.5 % ointment APPLY TO PSORIASIS IN EARS UP TO TWICE DAILY AS NEEDED UNTIL SMOOTH    insulin  aspart (NOVOLOG  FLEXPEN U-100 INSULIN ) 10 Units, Subcutaneous, 3 times a day (AC)    naloxone (NARCAN) 4 mg, Alternating Nares, Once as needed, One spray in either nostril once for known/suspected opioid overdose. May repeat every 2-3 minutes in alternating nostril til EMS arrives    NIFEdipine  (PROCARDIA  XL) 30 mg, Oral, Daily (standard)    omeprazole  (PRILOSEC) 20 mg, Oral, Daily (standard)    oxyCODONE  (ROXICODONE ) 5 mg, Oral, Every 6 hours PRN    testosterone  20.25 mg/1.25 gram (1.62 %) gel pump PLACE 2 SPRAYS ON SKIN DAILY    TRULICITY  3 mg, Subcutaneous, Every 7 days    ustekinumab -kfce (YESINTEK ) 45 mg/0.5 mL Syrg Inject the contents of 1 syringe (45 mg total) under the skin every 12 weeks.       Medical History:  Past Medical History[1]    Surgical History:  Past Surgical History[2]    Social History:   reports that he has never smoked. He has never been exposed to tobacco smoke. He has never used smokeless tobacco. He reports current alcohol  use. He reports current drug use. Frequency: 7.00 times per week. Drug: Marijuana.     OBJECTIVE   There were no vitals taken for this visit. Video visit     Patient weight not recorded    Physical Exam:    Constitutional:  Alert, NAD, interactive  HEENT:  Lids and Lashes normal, No scleral icterus, PERRLA  Pulm/Lungs: Unlabored respirations, symmetric chest wall movements, speaking in full sentences  EXTREMITIES: R 5th digit with well healed surgical site, still with limited ROM    Studies:   Pathology:    Microbiology:    Labs:  Lab Results   Component Value Date    WBC 7.5 01/28/2024    NEUTROABS 3.9 01/28/2024    LYMPHSABS 2.3 01/28/2024    EOSABS 0.4 01/28/2024    HGB 13.1 01/28/2024    HCT 39.7 01/28/2024    PLT 108 (L) 01/28/2024     Lab Results   Component Value Date    NA 136 01/28/2024    K 4.3 01/28/2024    CL 95 (L) 01/28/2024    CO2 31.2 (H) 01/28/2024    BUN 30 (H) 01/28/2024    CREATININE 1.33 (H) 01/28/2024    CALCIUM 9.6 01/28/2024    MG 2.2 02/23/2023    PHOS 3.4 10/15/2023     Lab Results   Component Value Date    ALKPHOS 75 01/14/2024    BILITOT 0.4 01/14/2024    BILIDIR 0.40 06/01/2018    PROT 7.8 01/14/2024    ALBUMIN 3.9 01/14/2024    ALT 50 (H) 01/14/2024    AST 41 (H) 01/14/2024    GGT 1,007 (H) 05/28/2018     Lab Results   Component Value Date    ESR 68 (H) 01/14/2024    ESR 91 (H) 02/23/2023    CRP <5.0 01/14/2024    CRP 17.0 (H) 02/23/2023     Lab Results   Component Value Date/Time    HEPCAB Nonreactive 09/26/2020 10:56 AM    HEPBSAB Nonreactive 09/26/2020 10:56 AM    HEPBCAB Nonreactive 09/26/2020 10:56 AM  HEPAIGG Nonreactive 04/22/2018 04:00 AM     No results found for: HCVR, HCVRNA, HCVIU, HCVRNAIU  Lab Results   Component Value Date/Time    AST 41 (H) 01/14/2024 11:55 AM    AST 14 04/13/2020 12:18 PM    ALT 50 (H) 01/14/2024 11:55 AM    ALT 11 04/13/2020 12:18 PM    PLT 108 (L) 01/28/2024 01:40 PM    PLT 170 04/13/2020 12:18 PM    ALBUMIN 3.9 01/14/2024 11:55 AM    ALBUMIN 3.5 04/13/2020 12:18 PM    BILITOT 0.4 01/14/2024 11:55 AM    BILITOT 0.5 04/13/2020 12:18 PM    INR 1.02 07/24/2021 12:26 AM    INR 1.0 05/01/2019 06:11 PM    CREATININE 1.33 (H) 01/28/2024 01:40 PM    CREATININE 1.0 04/13/2020 12:18 PM       HIV  No results found for: HIVAGAB, ACD4, CD4, HIVRS, HIVCP    Renal  Lab Results   Component Value Date/Time    CREATININE 1.33 (H) 01/28/2024 01:40 PM    CREATININE 1.0 04/13/2020 12:18 PM     Lab Results   Component Value Date/Time    GLUCOSEU Trace (A) 03/30/2019 03:53 AM    PROTEINUA 1+ (A) 03/30/2019 03:53 AM    PCRATIOUR 0.562 06/12/2022 10:17 AM       Radiology:  XR Finger 2 Or More Views Right  Result Date: 01/28/2024   Impression: Similar alignment of mildly displaced little finger middle phalanx base fracture. Mild increased osteolysis around the middle phalanx with persistent soft tissue swelling.    XR Finger 2 Or More Views Right  Result Date: 01/14/2024   Impression: Mildly displaced fracture involving little finger middle phalanx base. Severe chronic osseous abnormality of the middle phalanx likely reflecting sequela of remote/chronic osteomyelitis. There is soft tissue swelling throughout the little finger; if there is clinical concern for acute osteomyelitis, further evaluation with MRI may be warranted. Radiographic evaluation for acute osteomyelitis in this case is limited due to the lack of interval exams from 2019 and the underlying severe chronic osseous changes.      Results communicated via Epic fax to patient's primary care provider Gayle, Verneita Molt, FNP and referring physician Layman LELON Lenon Cory Dorothyann Effie, MD PhD  [facility] Infectious Diseases Clinic   [place], [place]  Phone: [phone]   Fax: [phone]                            [1]   Past Medical History:  Diagnosis Date    Cellulitis of left foot 07/24/2021    Diabetes mellitus (CMS-HCC)     Gastroparesis due to DM (CMS-HCC) 09/09/2019 Hypertension     Psoriasis     Substance abuse (CMS-HCC)    [2]   Past Surgical History:  Procedure Laterality Date    PR ANESTH,CLEFT LIP REPAIR Right 04/20/2018    Procedure: DEBRIDEMENT; SKIN, SUBCUTANEOUS TISSUE, & MUSCLE HAND and FINGER wounds;  Surgeon: Logan Amaryllis Aliment, MD;  Location: MAIN OR [place];  Service: Plastics    PR APPLICATION UNIPLANE EXTERNAL FIXATION SYSTEM Right 10/23/2023    Procedure: UPPER EXTREMITY -APPLICATION OF A UNIPLANE (PINS OR WIRES IN 1 PLANE), UNILATERAL, EXTERNAL FIXATION SYSTEM;  Surgeon: Bonifacio Cordella HERO, MD;  Location: OR [facility];  Service: Orthopedics    PR APPLY FOREARM SPLINT,STATIC Right 05/05/2018    Procedure: application right short arm ulnar gutter splint;  Surgeon:  Eagen Gene Deune, MD;  Location: MAIN OR [place];  Service: Plastics    PR APPLY FOREARM SPLINT,STATIC Right 05/11/2018    Procedure: Applic Short Arm Splint; Static;  Surgeon: Logan Amaryllis Aliment, MD;  Location: MAIN OR [place];  Service: Plastics    PR COLSC FLX W/RMVL OF TUMOR POLYP LESION SNARE TQ Left 10/06/2023    Procedure: COLONOSCOPY FLEX; W/REMOV TUMOR/LES BY SNARE;  Surgeon: Lucian Mikle Pfeiffer, MD;  Location: GI PROCEDURES [place] [facility];  Service: Gastroenterology    PR DEBRIDEMENT MUSCLE &/FASCIA 1ST 20 SQ CM/< Right 04/27/2018    Procedure: debridement right palmar wound and dorsal right small finger wound;  Surgeon: Logan Amaryllis Aliment, MD;  Location: MAIN OR [place];  Service: Plastics    PR FTH/GF FR W/DIR CLSR F/C/C/M/N/AX/G/H/F 20SQCM/< Right 05/11/2018    Procedure: Full thickness skin graft to right palmar hand and right small finger wounds;  Surgeon: Logan Amaryllis Aliment, MD;  Location: MAIN OR [facility];  Service: Plastics    PR INCIS DEEP FINGR/HAND BONE LESN Right 01/15/2024    Procedure: INCS DEEP W/OPEN BONE CORTEX HAND/FINGER;  Surgeon: Bonifacio Cordella HERO, MD;  Location: OR [facility] [place];  Service: Orthopedics    PR NEGATIVE PRESSURE WOUND THERAPY DME <= 50 SQ CM Right 04/22/2018    Procedure: Right Hand Wound Vac Change;  Surgeon: Barnetta Everts, MD;  Location: MAIN OR [place];  Service: Plastics    PR SUB GRFT F/S/N/H/F/G/M/D /<100SCM /<1ST 25 SCM Right 05/05/2018    Procedure: application allograft human skin to right hand and right small finger wounds;  Surgeon: Logan Amaryllis Aliment, MD;  Location: MAIN OR [place];  Service: Plastics    PR UPPER GI ENDOSCOPY,DIAGNOSIS Left 10/06/2023    Procedure: UGI ENDO, INCLUDE ESOPHAGUS, STOMACH, & DUODENUM &/OR JEJUNUM; DX W/WO COLLECTION SPECIMN, BY BRUSH OR WASH;  Surgeon: Lucian Mikle Pfeiffer, MD;  Location: [facility]ED[facility] [place] [facility];  Service: Gastroenterology    PR WOUND PREP, PED, TRK/ARM/LG 1ST 100 CM Right 05/05/2018    Procedure: Debridement right hand and small finger wounds in preparation for skin grafting;  Surgeon: Logan Amaryllis Aliment, MD;  Location: MAIN OR [facility];  Service: Plastics    PR WOUND PREP, PED, TRK/ARM/LG 1ST 100 CM Right 05/11/2018    Procedure: Surg Preparation/Creation Of Recipient Site By Excision Of Open Wound/Burn Eschar/Scar, Or Incisional Release Of Scar Contracture, Trunk/Arms/Legs; 1st 100 Sq Cm Or 1% Body Area Of Infants & Children;  Surgeon: Logan Amaryllis Aliment, MD;  Location: MAIN OR [place];  Service: Plastics    Right toe amputation

## 2024-03-01 NOTE — Telephone Encounter (Signed)
 Copied from CRM #[id number]. Topic: Access To Clinicians - Req Clinic Call Back  >> Mar 01, 2024 11:47 AM Almarie MATSU wrote:  Pt is highly upset that he has been without his medication and that [facility] has been trying to request order from office since Sept and hasn't heard anything. Freestyle Libre 3 + sensors. As soon as [facility] receives it they can send it out. [facility] ph# [phone]

## 2024-03-01 NOTE — Telephone Encounter (Signed)
 No forms from [facility] have been received on patient behalf from [facility] to our office.     I have contacted [facility] ph# [phone]. I was informed that patient does not have a profile with them.       I spoke with Almarie in new member processing who informed me that patient does have a profile and his sensors and supplies are requesting a new PA. Previous PA expired for patients free style Libre sensor and supplies on 02/23/24 request for medical records with rational is needed for approval.

## 2024-03-02 NOTE — Telephone Encounter (Signed)
 Patient came into office on 03/01/24 on speaker with [facility]. I had patient come to my office where I was able to assist him in getting the requested documents over to [facility]. Per rep- DOS 12/02/2023, A1C result 11/15/23 faxed to [phone] and [phone]. Fax and confirmation scanned into media amyers

## 2024-03-03 ENCOUNTER — Inpatient Hospital Stay: Admit: 2024-03-03 | Discharge: 2024-03-03 | Payer: Medicare (Managed Care)

## 2024-03-03 ENCOUNTER — Ambulatory Visit
Admit: 2024-03-03 | Discharge: 2024-03-03 | Payer: Medicare (Managed Care) | Attending: Plastic and Reconstructive Surgery | Primary: Plastic and Reconstructive Surgery

## 2024-03-03 ENCOUNTER — Ambulatory Visit: Admit: 2024-03-03 | Discharge: 2024-03-03 | Payer: Medicare (Managed Care)

## 2024-03-03 LAB — SEDIMENTATION RATE: ERYTHROCYTE SEDIMENTATION RATE: 71 mm/h — ABNORMAL HIGH (ref 0–20)

## 2024-03-03 LAB — C-REACTIVE PROTEIN: C-REACTIVE PROTEIN: <5 mg/L (ref <=10.0)

## 2024-03-03 NOTE — Progress Notes (Signed)
 ORTHOPAEDIC RETURN CLINIC NOTE      ASSESSMENT:  Cory Bentley is a 63 y.o. male status post debridement of right small finger middle phalanx for osteomyelitis   Date of surgery: 01/15/24     PLAN:  X-ray today reveals progressive healing at the prior site of osteomyelitis.  There is also now some subluxation of the middle phalanx volarly off the proximal phalangeal head.  We had a discussion regarding the treatment options for this and stated that I can potentially perform a fusion of that joint in the future once we have completely cleared the osteomyelitis if this is bothersome.  At this time he is not painful and will continue occupational therapy to maximize function.  Return in 2 months for repeat evaluation.     INTERIM HISTORY:  Doing well.  No new concerns.  Minimal pain.     PHYSICAL EXAM:  General: Well developed, well nourished male in no apparent distress  Musculoskeletal: Finger well-healed.  Persistent swelling of the digit however no signs of infection.  No pain with manipulation of the PIP joint.     IMAGING:  3 views of the right small finger reviewed

## 2024-03-03 NOTE — Patient Instructions (Signed)
 Thank you for choosing [facility]!  We appreciate the opportunity to participate in your care.      If any questions or concerns arise after your visit, please do not hesitant to contact me by [facility] or by calling my clinical support, [facility] [place], at [phone].      Voicemail messages: Messages are checked between 8:00 am- 4:00 pm Monday- Friday.    MyChart messages: These messages are checked by the clinical staff during normal business hours 8:30 am-4:30 pm Monday-Friday every 24-48 hours and are for non-urgent, non-emergent concerns. You may be asked to return for a follow up visit if it is deemed your questions are best handled in the clinic setting. If you are not signed up on [facility], please call the [facility] at [phone] to receive help activating your account.    Please let me know if I can be of assistance with this or other orthopaedic issues in the future.

## 2024-03-05 NOTE — Telephone Encounter (Signed)
 Isiah with [employer] called clinic on patient behalf to express grief with patient being unable to receive his Continuous Glucose Monitor from [facility]. Isiah states that [facility] has informed that they need the patient's diabetic treatment plan and patient adherence for CGM.    Will fax last office visit with PCP and endocrinology to [facility] at [phone].

## 2024-03-08 NOTE — Telephone Encounter (Unsigned)
 Copied from CRM #[id number]. Topic: Other - Other  >> Mar 08, 2024 12:09 PM Steen ORN wrote:  Insurance calling for refill on sensors. Lang was advised that pt was advised to call Endocrinologist for refill.

## 2024-03-08 NOTE — Telephone Encounter (Signed)
 [facility] Called and LVM requesting record documentation be completed again. They also left their fax number [phone], and refaxed blank form #4 times.     Patients form was completed, with a fax confirmation received on last week. [facility] was also informed via fax if any further notes or information was needed to reach out to patients Endocrinologist (office information provided). Endocrinology would be the specialty that would best attest to patients compliance with all diabetic standards such as CGM regimen and diabetes treatment plan.

## 2024-03-10 NOTE — Progress Notes (Signed)
 [facility] Delivery Pharm[facility] Refill Coordination Note    Specialty Medication(s) to be Shipped:   ustekinumab -kfce: YESINTEK  45 mg/0.5 mL Syrg    Other medication(s) to be shipped: No additional medications requested for fill at this time    Specialty Medications not needed at this time: N/A     Cory Bentley, DOB: 06-06-1960  Phone: There are no phone numbers on file.      All above HIPAA information was verified with patient.     Was a nurse, learning disability used for this call? No    Completed refill call assessment today to schedule patient's medication shipment from the [facility]  [phone]).  All relevant notes have been reviewed.     Specialty medication(s) and dose(s) confirmed: Regimen is correct and unchanged.   Changes to medications: Lexus reports no changes at this time.  Changes to insurance: No  New side effects reported not previously addressed with a pharmacist or physician: None reported  Questions for the pharmacist: No    Confirmed patient received a Conservation Officer, Historic Buildings and a Surveyor, Mining with first shipment. The patient will receive a drug information handout for each medication shipped and additional FDA Medication Guides as required.       DISEASE/MEDICATION-SPECIFIC INFORMATION        For patients on injectable medications: Next injection is scheduled for 12/05.    SPECIALTY MEDICATION ADHERENCE     Medication Adherence    Patient reported X missed doses in the last month: 0  Specialty Medication: ustekinumab -kfce (YESINTEK ) 45 mg/0.5 mL Syrg  Patient is on additional specialty medications: No  Patient is on more than two specialty medications: No  Any gaps in refill history greater than 2 weeks in the last 3 months: no  Demonstrates understanding of importance of adherence: yes  Informant: patient  Reliability of informant: reliable  Provider-estimated medication adherence level: good  Patient is at risk for Non-Adherence: No  Reasons for non-adherence: no problems identified  Confirmed plan for next specialty medication refill: delivery by pharmacy  Refills needed for supportive medications: not needed          Refill Coordination    Has the Patients' Contact Information Changed: No  Is the Shipping Address Different: No         Were doses missed due to medication being on hold? No    ustekinumab -kfce: YESINTEK  45 mg/0.5 mL Syrg  : 0 doses of medicine on hand       REFERRAL TO PHARMACIST     Referral to the pharmacist: Not needed      SHIPPING     Shipping address confirmed in Epic.     Cost and Payment: Patient has a $0 copay, payment information is not required.    Delivery Scheduled: Yes, Expected medication delivery date: 12/03.     Medication will be delivered via UPS to the prescription address in Epic WAM.    Camelia LITTIE [facility]  Specialty Technician

## 2024-03-13 NOTE — Progress Notes (Signed)
 Established patient Visit:  Date of Service: 03/15/2024  CC:  Chief Complaint  Patient presents with  . Foot Problem     Right foot ulcer Left foot lesion    History of Present Illness Christian Flores is a 63 year old male who presents for follow-up of ulcerations on the right great toe 1st MPJ and fifth metatarsal phalangeal joint.  He experiences chronic pain associated with the ulcerations on the right great toe and fifth metatarsal phalangeal joint, rating it as 6 to 7 out of 10. He takes oxycodone 5 mg occasionally for pain relief and gabapentin 800 mg four times a day. He has two oxycodone tablets left and takes them in the morning and evening. He is uncertain about who prescribes his gabapentin, suggesting it might be his endocrinologist, who manages his diabetes.  He continues to take doxycycline twice daily, though he has a few doses remaining. A wound culture was taken at the last visit, but results have not yet returned. He notes some drainage from the wounds, but not consistently, and denies any bleeding.  He states his foot still remains slightly swollen with some erythema present and thinks that is maybe slightly improved compared to last visit.  He also notes a fissure to his left posterior heel.  He is not seeing any bleeding to the area but has been applying some ointment as well as bandaging.    Previous Hemoglobin A1C Result(s) Hemoglobin A1C  Date Value Ref Range Status  12/09/2023 7.3 (H) 4.2 - 5.6 % Final  12/02/2023 6.8 (H) 4.8 - 5.6 % Final  06/10/2023 7.0 (H) 4.2 - 5.6 % Final  03/03/2023 6.9 (H) 4.8 - 5.6 % Final  11/27/2022 7.2 (H) 4.2 - 5.6 % Final  07/04/2022 8.2 (H) 4.2 - 5.6 % Final   PMH: Past Medical History:  Diagnosis Date  . Hyperlipidemia   . Hypertension   . Low testosterone   . Type 2 diabetes mellitus (CMS/HHS-HCC)     Medication: Current Outpatient Medications on File Prior to Visit  Medication Sig Dispense Refill  . betamethasone  valerate (VALISONE) 0.1 % ointment Apply to thick areas of psoriasis twice daily as needed until smooth. Avoid face and skin folds.    . blood-glucose sensor (FREESTYLE LIBRE 2 PLUS SENSOR) Use 1 each every 15 (fifteen) days 6 each 3  . blood-glucose sensor (FREESTYLE LIBRE 3 PLUS SENSOR) Devi Use 1 each every 15 (fifteen) days 2 each 11  . chlorthalidone 25 MG tablet Take 25 mg by mouth once daily    . doxycycline (VIBRA-TABS) 100 MG tablet Take 1 tablet (100 mg total) by mouth 2 (two) times daily for 7 days 14 tablet 0  . dulaglutide (TRULICITY) 1.5 mg/0.5 mL subcutaneous pen injector Inject 0.5 mLs (1.5 mg total) subcutaneously every 7 (seven) days 6 mL 3  . empagliflozin (JARDIANCE) 10 mg tablet Take 1 tablet (10 mg total) by mouth once daily 90 tablet 3  . ezetimibe (ZETIA) 10 mg tablet Take 1 tablet (10 mg total) by mouth once daily 90 tablet 3  . gentamicin (GARAMYCIN) 0.1 % ointment Apply topically 3 (three) times daily    . hydrocortisone 2.5 % ointment Apply to psoriasis in ears up to twice daily as needed until smooth.    . miscellaneous medical supply Misc Bp cuff and machine 1 each 0  . NIFEdipine (ADALAT CC) 30 MG ER tablet     . omeprazole (PRILOSEC) 20 MG DR capsule Take 20 mg by  mouth once daily    . oxyCODONE (ROXICODONE) 5 MG immediate release tablet Take 5 mg by mouth every 4 (four) hours as needed for Pain    . testosterone (ANDROGEL) 20.25 mg/1.25 gram (1.62 %) gel in metered dose pump Place onto the skin    . YESINTEK 45 mg/0.5 mL Syrg Inject the contents of 1 syringe (45 mg total) under the skin every 12 weeks.    SABRA aspirin 81 MG EC tablet Take 1 tablet (81 mg total) by mouth once daily (Patient not taking: Reported on 02/18/2024) 90 tablet 1  . atorvastatin (LIPITOR) 40 MG tablet Take 40 mg by mouth once daily (Patient not taking: Reported on 03/15/2024)    . cyclobenzaprine (FLEXERIL) 5 MG tablet Take 5 mg by mouth 3 (three) times daily (Patient not taking: Reported on  03/04/2024)    . gabapentin (NEURONTIN) 800 MG tablet Take 800 mg by mouth 4 (four) times daily    . metoprolol  tartrate (LOPRESSOR ) 50 MG tablet Take 1 tablet (50 mg total) by mouth 2 (two) times daily for 1 day 2 tablet 0   No current facility-administered medications on file prior to visit.    Allergies: Allergies as of 03/15/2024 - Reviewed 03/15/2024  Allergen Reaction Noted  . Bee [venom-yellow jacket] Anaphylaxis 06/10/2023    Surgical History: Past Surgical History:  Procedure Laterality Date  . finger surgery Right 10/2023   digit widget rt hand pinky finger  . TONSILLECTOMY      Social History: Social History   Socioeconomic History  . Marital status: Single  Tobacco Use  . Smoking status: Never  . Smokeless tobacco: Never  Substance and Sexual Activity  . Alcohol use: Not Currently  . Drug use: Yes    Types: Marijuana    Comment: Pain reliver   Social Drivers of Health   Financial Resource Strain: Low Risk (07/24/2022)   Received from [facility]   Overall Financial Resource Strain (CARDIA)   . Difficulty of Paying Living Expenses: Not hard at all  Food Insecurity: No Food Insecurity (06/03/2023)   Received from [facility]   Hunger Vital Sign   . Within the past 12 months, you worried that your food would run out before you got the money to buy more.: Never true   . Within the past 12 months, the food you bought just didn't last and you didn't have money to get more.: Never true  Transportation Needs: No Transportation Needs (06/03/2023)   Received from [facility]   [facility] - Transportation   . Lack of Transportation (Medical): No   . Lack of Transportation (Non-Medical): No  Physical Activity: Insufficiently Active (11/30/2020)   Received from [facility]   Exercise Vital Sign   . On average, how many days per week do you engage in moderate to strenuous exercise (like a brisk walk)?: 3 days   . On average, how many minutes do you engage  in exercise at this level?: 40 min  Stress: No Stress Concern Present (11/30/2020)   Received from [facility] - Occupational Stress Questionnaire   . Feeling of Stress : Only a little  Social Connections: Unknown (11/30/2020)   Received from [facility]   Social Connection and Isolation Panel   . In a typical week, how many times do you talk on the phone with family, friends, or neighbors?: More than three times a week   .  How often do you get together with friends or relatives?: More than three times a week   . How often do you attend church or religious services?: Patient declined   . Do you belong to any clubs or organizations such as church groups, unions, fraternal or athletic groups, or school groups?: Patient declined   . How often do you attend meetings of the clubs or organizations you belong to?: Patient declined   . Are you married, widowed, divorced, separated, never married, or living with a partner?: Divorced  Housing Stability: Unknown (03/15/2024)   Housing Stability Vital Sign   . Homeless in the Last Year: No   Social History   Tobacco Use  Smoking Status Never  Smokeless Tobacco Never    Past Family History: Family History  Problem Relation Name Age of Onset  . Diabetes Mother    . Diabetes Father    . Diabetes Sister       Review of Systems:  A comprehensive 14 point ROS was performed, reviewed, and the pertinent orthopaedic findings are documented in the HPI.  Objective: General/Constitutional: No apparent distress: well-nourished and well developed.  Psych: Normal mood and affect, oriented to person, place and time (AOx3)  Vascular: DP/PT pulses intact bil, CFT intact to digits foot bil, reduced hair growth to digits foot noted bil.  Mild bilateral lower extremity nonpitting edema left greater than right.  Neuro: Light touch sensation reduced to digits bil.  Semmes Weinstein monofilament testing left  3/10, right 2/10.  Derm: Nails appear to be slightly thickened, mildly discolored, well trimmed x 10.  Skin appears to be slightly thin and atrophic to both feet.  Dry cracked skin to the posterior/plantar aspects of both heels.  Subfirst metatarsal phalange joint ulceration right foot appears to be epithelialized today with some callus formation over this area, once removed did not reveal any openings today.  Posterior plantar left heel appears to have a skin fissure present to the area, once removed no openings present today, no signs of infection.  Ulceration present to the plantar lateral aspect of the right fifth metatarsal phalange joint measures approximately 0.7 x 0.7 x 0.8 cm and probes fairly deep close to the capsule the fifth metatarsal phalange joint, appears to have associated erythema present to the area today and mild edema, when pressing on the dorsal lateral aspect of the foot there did appear to be some purulent drainage present to the fifth metatarsal ulceration that was open concerning for possible deeper seeded abscess.  MSK: Right second toe amputation.  Cavus foot structure bilaterally.  Plantarflexed right first metatarsal.  Some mild pain on palpation right foot.  Previous x-ray right foot 3 views AP, lateral, lateral oblique: No evidence of osteomyelitis present.  Status post partial second ray amputation.  Slight cavus foot structure present overall.  Posterior and plantar calcaneal heel spurs arthritic changes present the ankle joint on the lateral view both off the medial malleolus and distal fibula, appears to be relatively unchanged compared to previous imaging.  Some arthritis also present to the IPJ hallux from likely previous fracture, sequela.  Mild tailor's bunion.  Assessment: Encounter Diagnoses  Name Primary?  . Cellulitis and abscess of toe of right foot Yes  . Acute osteomyelitis of right foot (CMS/HHS-HCC)   . Neuropathic ulcer of foot, right, with  necrosis of bone (CMS/HHS-HCC)   . Type 2 diabetes mellitus with polyneuropathy (CMS/HHS-HCC)   . History of amputation of lesser toe of right foot (HHS-HCC)   .  Acquired absence of other toe(s), unspecified side (HHS-HCC)    Assessment & Plan Abscess/cellulitis right fifth metatarsal phalangeal joint area with associated neuropathic foot ulceration, concern for osteomyelitis fifth metatarsal phalangeal joint. -Wound debridement performed as described below, patient tolerated procedure well.  Subfirst metatarsal phalange joint wound appears to be healed today. - Previous x-rays did not show evidence for osteomyelitis.  Deferred x-ray imaging today as patient's infection appears to be worsening.  Recommended to patient to go to the emergency room after the visit today for IV antibiotics and for MRI.  Discussed possible need in the future for partial fifth ray amputation versus metatarsal resection versus I&D. -Patient's infection appears to have worsened despite being on doxycycline as well as dressing changes with offloading measures.  Concern for deeper seeded infection at this time. - Betadine wet-to-dry dressing applied today. - Patient to continue ambulation postop shoe. - Discussed chronic pain management.  Discussed that in the future patient should have this discussion with PCP and possibly pain management specialist.  Patient currently on gabapentin 800 mg 4 times a day and does take oxycodone 5 mg every 4 hours as needed.  He is unsure where he gets this medicine from.  Diabetes type 2 with polyneuropathy - Recommend daily foot inspections, proper glycemic control, use of lotions to feet daily, no barefoot walking, and use of supportive shoes at all times.  Debridment of ulcer: Location: Right fifth metatarsal phalange joint plantar lateral  Pre-debridement measurement: 0.7 x 0.7 x 0.8cm Post-debridement measurement: Same Tissue removed:   Hyperkeratotic tissue, biofilm, fibrous tissue,  necrotic tissue Ulcer was debrided sharply with combination of tissue nippers and scalpel blade into the subcutaneous tissue  Left heel fissure -Paring of callus performed with 15 blade without incident.  Patient tolerated procedure well. - In the future would recommend usage of urea cream 40% topical or Kerasal cream to areas of dry skin twice daily.  No orders of the defined types were placed in this encounter.   No follow-ups on file.   Prentice Ozell Lee, DPM

## 2024-03-16 ENCOUNTER — Encounter: Payer: Self-pay | Admitting: Emergency Medicine

## 2024-03-16 ENCOUNTER — Emergency Department

## 2024-03-16 ENCOUNTER — Other Ambulatory Visit: Payer: Self-pay

## 2024-03-16 ENCOUNTER — Inpatient Hospital Stay
Admission: EM | Admit: 2024-03-16 | Discharge: 2024-03-19 | DRG: 629 | Disposition: A | Attending: Internal Medicine | Admitting: Internal Medicine

## 2024-03-16 ENCOUNTER — Inpatient Hospital Stay

## 2024-03-16 DIAGNOSIS — I10 Essential (primary) hypertension: Secondary | ICD-10-CM

## 2024-03-16 DIAGNOSIS — L97509 Non-pressure chronic ulcer of other part of unspecified foot with unspecified severity: Secondary | ICD-10-CM | POA: Diagnosis present

## 2024-03-16 DIAGNOSIS — E1151 Type 2 diabetes mellitus with diabetic peripheral angiopathy without gangrene: Secondary | ICD-10-CM | POA: Diagnosis not present

## 2024-03-16 DIAGNOSIS — L97512 Non-pressure chronic ulcer of other part of right foot with fat layer exposed: Secondary | ICD-10-CM

## 2024-03-16 DIAGNOSIS — Z794 Long term (current) use of insulin: Secondary | ICD-10-CM | POA: Diagnosis not present

## 2024-03-16 DIAGNOSIS — L97419 Non-pressure chronic ulcer of right heel and midfoot with unspecified severity: Secondary | ICD-10-CM | POA: Diagnosis not present

## 2024-03-16 DIAGNOSIS — M86171 Other acute osteomyelitis, right ankle and foot: Secondary | ICD-10-CM | POA: Diagnosis not present

## 2024-03-16 DIAGNOSIS — L97515 Non-pressure chronic ulcer of other part of right foot with muscle involvement without evidence of necrosis: Secondary | ICD-10-CM | POA: Diagnosis not present

## 2024-03-16 DIAGNOSIS — E663 Overweight: Secondary | ICD-10-CM

## 2024-03-16 DIAGNOSIS — M869 Osteomyelitis, unspecified: Principal | ICD-10-CM | POA: Insufficient documentation

## 2024-03-16 DIAGNOSIS — E1169 Type 2 diabetes mellitus with other specified complication: Secondary | ICD-10-CM

## 2024-03-16 DIAGNOSIS — E11621 Type 2 diabetes mellitus with foot ulcer: Secondary | ICD-10-CM | POA: Diagnosis not present

## 2024-03-16 LAB — GLUCOSE, CAPILLARY
Glucose-Capillary: 111 mg/dL — ABNORMAL HIGH (ref 70–99)
Glucose-Capillary: 186 mg/dL — ABNORMAL HIGH (ref 70–99)

## 2024-03-16 LAB — COMPREHENSIVE METABOLIC PANEL WITH GFR
ALT: 30 U/L (ref 0–44)
AST: 26 U/L (ref 15–41)
Albumin: 4 g/dL (ref 3.5–5.0)
Alkaline Phosphatase: 83 U/L (ref 38–126)
Anion gap: 12 (ref 5–15)
BUN: 27 mg/dL — ABNORMAL HIGH (ref 8–23)
CO2: 30 mmol/L (ref 22–32)
Calcium: 9.9 mg/dL (ref 8.9–10.3)
Chloride: 95 mmol/L — ABNORMAL LOW (ref 98–111)
Creatinine, Ser: 1.05 mg/dL (ref 0.61–1.24)
GFR, Estimated: >60 mL/min (ref >60)
Glucose, Bld: 149 mg/dL — ABNORMAL HIGH (ref 70–99)
Potassium: 4.2 mmol/L (ref 3.5–5.1)
Sodium: 136 mmol/L (ref 135–145)
Total Bilirubin: 0.6 mg/dL (ref 0.0–1.2)
Total Protein: 8.4 g/dL — ABNORMAL HIGH (ref 6.5–8.1)

## 2024-03-16 LAB — LACTIC ACID, PLASMA
Lactic Acid, Venous: 1.4 mmol/L (ref 0.5–1.9)
Lactic Acid, Venous: 1.5 mmol/L (ref 0.5–1.9)

## 2024-03-16 LAB — CBC WITH DIFFERENTIAL/PLATELET
Abs Immature Granulocytes: 0.04 K/uL (ref 0.00–0.07)
Basophils Absolute: 0.1 K/uL (ref 0.0–0.1)
Basophils Relative: 1 %
Eosinophils Absolute: 0.2 K/uL (ref 0.0–0.5)
Eosinophils Relative: 2 %
HCT: 38.2 % — ABNORMAL LOW (ref 39.0–52.0)
Hemoglobin: 12.6 g/dL — ABNORMAL LOW (ref 13.0–17.0)
Immature Granulocytes: 0 %
Lymphocytes Relative: 17 %
Lymphs Abs: 1.7 K/uL (ref 0.7–4.0)
MCH: 29.2 pg (ref 26.0–34.0)
MCHC: 33 g/dL (ref 30.0–36.0)
MCV: 88.6 fL (ref 80.0–100.0)
Monocytes Absolute: 1.3 K/uL — ABNORMAL HIGH (ref 0.1–1.0)
Monocytes Relative: 13 %
Neutro Abs: 7 K/uL (ref 1.7–7.7)
Neutrophils Relative %: 67 %
Platelets: 274 K/uL (ref 150–400)
RBC: 4.31 MIL/uL (ref 4.22–5.81)
RDW: 14.2 % (ref 11.5–15.5)
WBC: 10.2 K/uL (ref 4.0–10.5)
nRBC: 0 % (ref 0.0–0.2)

## 2024-03-16 LAB — HIV ANTIBODY (ROUTINE TESTING W REFLEX): HIV Screen 4th Generation wRfx: NONREACTIVE

## 2024-03-16 MED ORDER — METOPROLOL TARTRATE 50 MG PO TABS
50.0000 mg | ORAL_TABLET | Freq: Two times a day (BID) | ORAL | Status: DC
  Administered 2024-03-17 – 2024-03-19 (×5): 50 mg via ORAL
  Filled 2024-03-16 (×6): qty 1

## 2024-03-16 MED ORDER — SODIUM CHLORIDE 0.9 % IV SOLN
2.0000 g | Freq: Three times a day (TID) | INTRAVENOUS | Status: AC
  Administered 2024-03-16 – 2024-03-18 (×7): 2 g via INTRAVENOUS
  Filled 2024-03-16 (×8): qty 12.5

## 2024-03-16 MED ORDER — HYDROMORPHONE HCL 1 MG/ML IJ SOLN
0.5000 mg | INTRAMUSCULAR | Status: DC | PRN
  Administered 2024-03-16 – 2024-03-17 (×3): 1 mg via INTRAVENOUS
  Filled 2024-03-16 (×3): qty 1

## 2024-03-16 MED ORDER — SODIUM CHLORIDE 0.9 % IV SOLN
3.0000 g | Freq: Once | INTRAVENOUS | Status: AC
  Administered 2024-03-16: 3 g via INTRAVENOUS
  Filled 2024-03-16: qty 8

## 2024-03-16 MED ORDER — PANTOPRAZOLE SODIUM 40 MG PO TBEC
40.0000 mg | DELAYED_RELEASE_TABLET | Freq: Every day | ORAL | Status: DC
  Administered 2024-03-17 – 2024-03-19 (×3): 40 mg via ORAL
  Filled 2024-03-16 (×4): qty 1

## 2024-03-16 MED ORDER — ATORVASTATIN CALCIUM 20 MG PO TABS
40.0000 mg | ORAL_TABLET | Freq: Every day | ORAL | Status: DC
  Administered 2024-03-17 – 2024-03-18 (×2): 40 mg via ORAL
  Filled 2024-03-16 (×2): qty 2

## 2024-03-16 MED ORDER — VANCOMYCIN HCL IN DEXTROSE 1-5 GM/200ML-% IV SOLN
1000.0000 mg | Freq: Two times a day (BID) | INTRAVENOUS | Status: AC
  Administered 2024-03-17 – 2024-03-18 (×4): 1000 mg via INTRAVENOUS
  Filled 2024-03-16 (×4): qty 200

## 2024-03-16 MED ORDER — VANCOMYCIN HCL IN DEXTROSE 1-5 GM/200ML-% IV SOLN
1000.0000 mg | Freq: Once | INTRAVENOUS | Status: AC
  Administered 2024-03-16: 1000 mg via INTRAVENOUS
  Filled 2024-03-16: qty 200

## 2024-03-16 MED ORDER — GABAPENTIN 400 MG PO CAPS
800.0000 mg | ORAL_CAPSULE | Freq: Four times a day (QID) | ORAL | Status: DC
  Administered 2024-03-16: 800 mg via ORAL
  Filled 2024-03-16: qty 2

## 2024-03-16 MED ORDER — SODIUM CHLORIDE 0.9 % IV SOLN
3.0000 g | Freq: Four times a day (QID) | INTRAVENOUS | Status: DC
  Filled 2024-03-16: qty 8

## 2024-03-16 MED ORDER — INSULIN ASPART 100 UNIT/ML IJ SOLN
0.0000 [IU] | Freq: Three times a day (TID) | INTRAMUSCULAR | Status: DC
  Administered 2024-03-17 (×2): 2 [IU] via SUBCUTANEOUS
  Administered 2024-03-17 – 2024-03-18 (×3): 3 [IU] via SUBCUTANEOUS
  Administered 2024-03-19: 2 [IU] via SUBCUTANEOUS
  Administered 2024-03-19 (×2): 3 [IU] via SUBCUTANEOUS
  Filled 2024-03-16 (×2): qty 3
  Filled 2024-03-16 (×2): qty 2
  Filled 2024-03-16: qty 3
  Filled 2024-03-16: qty 2
  Filled 2024-03-16 (×2): qty 3
  Filled 2024-03-16: qty 2

## 2024-03-16 MED ORDER — ONDANSETRON HCL 4 MG/2ML IJ SOLN: 4.0000 mg | Freq: Four times a day (QID) | INTRAMUSCULAR | Status: DC | PRN

## 2024-03-16 MED ORDER — ENOXAPARIN SODIUM 40 MG/0.4ML IJ SOSY
40.0000 mg | PREFILLED_SYRINGE | INTRAMUSCULAR | Status: DC
  Administered 2024-03-17: 40 mg via SUBCUTANEOUS
  Filled 2024-03-16: qty 0.4

## 2024-03-16 MED ORDER — HYDROCODONE-ACETAMINOPHEN 5-325 MG PO TABS
1.0000 | ORAL_TABLET | Freq: Four times a day (QID) | ORAL | Status: DC | PRN
  Filled 2024-03-16: qty 1

## 2024-03-16 MED ORDER — SENNOSIDES-DOCUSATE SODIUM 8.6-50 MG PO TABS: 1.0000 | ORAL_TABLET | Freq: Every evening | ORAL | Status: DC | PRN

## 2024-03-16 MED ORDER — VANCOMYCIN HCL IN DEXTROSE 1-5 GM/200ML-% IV SOLN
1000.0000 mg | Freq: Once | INTRAVENOUS | Status: AC
  Administered 2024-03-16: 1000 mg via INTRAVENOUS
  Filled 2024-03-16 (×2): qty 200

## 2024-03-16 MED ORDER — ONDANSETRON HCL 4 MG PO TABS: 4.0000 mg | ORAL_TABLET | Freq: Four times a day (QID) | ORAL | Status: DC | PRN

## 2024-03-16 MED ORDER — EMPAGLIFLOZIN 10 MG PO TABS
10.0000 mg | ORAL_TABLET | Freq: Every day | ORAL | Status: DC
  Administered 2024-03-17: 10 mg via ORAL
  Filled 2024-03-16 (×2): qty 1

## 2024-03-16 MED FILL — YESINTEK 45 MG/0.5 ML SUBCUTANEOUS SYRINGE: SUBCUTANEOUS | 84 days supply | Qty: 0.5 | Fill #2

## 2024-03-16 NOTE — Consult Note (Signed)
 PODIATRIC SURGERY: CONSULT NOTE  Reason for Consult: Right foot wound   HPI:  Everett Ehrler is a 63 y.o. male with medical history significant of IIDM on diet control, diabetic neuropathy, CKD stage II, HTN, presented with worsening of right foot ulcer infection.   Patient noted callus formation with ulcer development approximately 1 month ago and has been seeing my colleague Dr. Lennie in the outpatient setting for continued care.  On 03/08/2024 appointment patient was endorsing increased pain to the right foot with increased swelling and redness and was dispensed a prescription for doxycycline 7-day course as well as follow-up within the next week due to depth of the ulceration.  X-ray imaging was also taken on 03/08/2024 visit without any radiographic signs of osteomyelitis.  Patient at multiple times had seen other providers that were recommending a similar courses of treatment with offloading, wound care, and appropriate follow-up.  Patient appears through documentation to have been noncompliant regarding offloading advice and postop shoe usage.  He was seen 1 week later with clinical signs of worsening of wound.  At that time he was advised to go to the emergency room given he has failed his oral outpatient antibiotics -radiographic imaging was deferred given acute recommendation of emergency room and hospital admission recommendation.  Patient returned home on 03/16/2024 presented to emergency room at [facility] in order to be admitted for worsening right foot wound.  Podiatry consulted for right foot wound infection.  Denies: F/C/N/V  SOB/CP/calf pain.  Last PO: Earlier today   PMHx:  Past Medical History:  Diagnosis Date   CKD (chronic kidney disease), stage II    Diabetes mellitus without complication (HCC)    Hypertension     Surgical Hx: History reviewed. No pertinent surgical history.  FHx: History reviewed. No pertinent family history.  Social History:  reports that he has never  smoked. He has never used smokeless tobacco. He reports current drug use. Drug: Marijuana. He reports that he does not drink alcohol.  Allergies: No Known Allergies  Facility-Administered Medications Prior to Admission  Medication Dose Route Frequency Provider Last Rate Last Admin   ivabradine  (CORLANOR ) tablet 15 mg  15 mg Oral Once        Medications Prior to Admission  Medication Sig Dispense Refill   metoprolol  tartrate (LOPRESSOR ) 50 MG tablet Take 50 mg by mouth 2 (two) times daily.     HYDROcodone -acetaminophen  (NORCO) 5-325 MG tablet Take 1 tablet by mouth every 6 (six) hours as needed for moderate pain. (Patient not taking: Reported on 03/16/2024) 15 tablet 0   ivabradine  (CORLANOR ) 7.5 MG TABS tablet Take tablets (15mg ) TWO hours prior to your cardiac CT scan. (Patient not taking: Reported on 03/16/2024) 2 tablet 0   metoprolol  tartrate (LOPRESSOR ) 100 MG tablet Take tablet (100mg ) TWO hours prior to your cardiac CT scan. (Patient not taking: Reported on 03/16/2024) 1 tablet 0   ondansetron  (ZOFRAN  ODT) 4 MG disintegrating tablet Take 1 tablet (4 mg total) by mouth every 8 (eight) hours as needed for nausea or vomiting. (Patient not taking: Reported on 03/16/2024) 15 tablet 0    Physical Exam: Blood pressure (!) 155/79, pulse 89, temperature 99.1 F (37.3 C), resp. rate 18, height 5' 8 (1.727 m), weight 81.6 kg, SpO2 98%.  Constitutional: Patient appears of normal development and good nutritional status for stated age, well-groomed, and of normal body habitus. Phonating appropriately.   Psychiatric: Alert and oriented to time, place, person and situation. The patient is noted to have  good judgment and insight into the hospital visit  FOCUSED LOWER EXTREMITY EXAMINATION: Right foot  Neurological:  - Protective sensation diminished / absent - Gross protective sensation diminished bilaterally.  - No focal motor or sensory deficits identified bilaterally.   Vascular:  - Dorsalis  Pedis artery on palpation: 2 - Posterior Tibial artery on palpation: 1 - Capillary Filling Times: WNL - Peripheral edema: Localized to the right forefoot - Dependency Rubor: Mild - Varicosities: Mild   Musculoskeletal:  S/p second digit amputation, right foot Muscle strength: 5/5 in all 4 quadrants  Ankle Joint ROM: decreased, equinus noted  Dermatological:  - Skin quality: Atrophic - Interdigital spaces: c/d/i   #WOUND 1   Type: Full thickness neuropathic ulceration Location: Right lateral fifth metatarsal head --- Wound base: 30% Granular ; 50% Fibrosis ; 20% Necrotic  Adjacent tissue/ Wound borders: Rolled PTB: Probes to fifth metatarsal capsule/bone Malodor: Mild Active Drainage: None; If yes, consistency: Purulent drainage noted in clinic    Results for orders placed or performed during the hospital encounter of 03/16/24 (from the past 48 hours)  Comprehensive metabolic panel     Status: Abnormal   Collection Time: 03/16/24 12:21 PM  Result Value Ref Range   Sodium 136 135 - 145 mmol/L   Potassium 4.2 3.5 - 5.1 mmol/L   Chloride 95 (L) 98 - 111 mmol/L   CO2 30 22 - 32 mmol/L   Glucose, Bld 149 (H) 70 - 99 mg/dL    Comment: Glucose reference range applies only to samples taken after fasting for at least 8 hours.   BUN 27 (H) 8 - 23 mg/dL   Creatinine, Ser 8.94 0.61 - 1.24 mg/dL   Calcium 9.9 8.9 - 89.6 mg/dL   Total Protein 8.4 (H) 6.5 - 8.1 g/dL   Albumin 4.0 3.5 - 5.0 g/dL   AST 26 15 - 41 U/L   ALT 30 0 - 44 U/L   Alkaline Phosphatase 83 38 - 126 U/L   Total Bilirubin 0.6 0.0 - 1.2 mg/dL   GFR, Estimated >39 >39 mL/min    Comment: (NOTE) Calculated using the CKD-EPI Creatinine Equation (2021)    Anion gap 12 5 - 15    Comment: Performed at [facility], [address] [place], [place] [address]  CBC with Differential     Status: Abnormal   Collection Time: 03/16/24 12:21 PM  Result Value Ref Range   WBC 10.2 4.0 - 10.5 K/uL   RBC 4.31 4.22 -  5.81 MIL/uL   Hemoglobin 12.6 (L) 13.0 - 17.0 g/dL   HCT 61.7 (L) 60.9 - 47.9 %   MCV 88.6 80.0 - 100.0 fL   MCH 29.2 26.0 - 34.0 pg   MCHC 33.0 30.0 - 36.0 g/dL   RDW 85.7 88.4 - 84.4 %   Platelets 274 150 - 400 K/uL   nRBC 0.0 0.0 - 0.2 %   Neutrophils Relative % 67 %   Neutro Abs 7.0 1.7 - 7.7 K/uL   Lymphocytes Relative 17 %   Lymphs Abs 1.7 0.7 - 4.0 K/uL   Monocytes Relative 13 %   Monocytes Absolute 1.3 (H) 0.1 - 1.0 K/uL   Eosinophils Relative 2 %   Eosinophils Absolute 0.2 0.0 - 0.5 K/uL   Basophils Relative 1 %   Basophils Absolute 0.1 0.0 - 0.1 K/uL   Immature Granulocytes 0 %   Abs Immature Granulocytes 0.04 0.00 - 0.07 K/uL    Comment: Performed at [facility],  [address], [place], [place] [address]  Lactic acid, plasma     Status: None   Collection Time: 03/16/24 12:21 PM  Result Value Ref Range   Lactic Acid, Venous 1.4 0.5 - 1.9 mmol/L    Comment: Performed at [facility], [address] [place], [place] [address]  Glucose, capillary     Status: Abnormal   Collection Time: 03/16/24  4:59 PM  Result Value Ref Range   Glucose-Capillary 111 (H) 70 - 99 mg/dL    Comment: Glucose reference range applies only to samples taken after fasting for at least 8 hours.   US  ARTERIAL ABI (SCREENING LOWER EXTREMITY) Result Date: 03/16/2024 CLINICAL DATA:  Diabetic infection of the right foot. EXAM: NONINVASIVE PHYSIOLOGIC VASCULAR STUDY OF BILATERAL LOWER EXTREMITIES TECHNIQUE: Evaluation of both lower extremities were performed at rest, including calculation of ankle-brachial indices with single level Doppler, pressure and pulse volume recording. COMPARISON:  None Available. FINDINGS: Right ABI:  1.07 Left ABI:  1.07 Right Lower Extremity: Monophasic posterior tibial and dorsalis pedis waveforms. Left Lower Extremity: Biphasic/monophasic posterior tibial and monophasic dorsalis pedis waveforms. 1.0-1.4 Normal IMPRESSION: Normal bilateral resting  ankle-brachial indices. Distal waveform abnormalities would implicate underlying tibial disease bilaterally. Electronically Signed   By: Marcey Moan M.D.   On: 03/16/2024 17:08   MRI Right foot without contrast Result Date: 03/16/2024 CLINICAL DATA:  Soft tissue infection suspected, foot, xray done EXAM: MRI OF THE RIGHT FOREFOOT WITHOUT CONTRAST TECHNIQUE: Multiplanar, multisequence MR imaging of the right forefoot was performed. No intravenous contrast was administered. COMPARISON:  None Available. FINDINGS: Bones/Joint/Cartilage Apparent area of soft tissue ulceration lateral to the 5th metatarsophalangeal joint, further described below. There are underlying signal abnormalities within the head of the 5th metatarsal and the 5th proximal phalanx, suspicious for osteomyelitis. Namely, there is increased T2 and decreased T1 marrow signal in these areas with suspected early cortical destruction of the 5th metatarsal head, best seen on the coronal T1 weighted images. T2 hyperintensity extends proximally into the mid shaft of the 5th metatarsal. There is a small effusion of the 5th metatarsophalangeal joint. The additional metatarsals and phalanges appear unremarkable. The alignment is normal at the Lisfranc joint. No significant tarsal bone abnormalities identified. Ligaments Intact Lisfranc ligament. The collateral ligaments of the metatarsophalangeal joints appear intact. Muscles and Tendons Mild generalized muscular T2 hyperintensity. The forefoot tendons appear intact, without significant tenosynovitis. Soft tissues As above, apparent soft tissue ulceration lateral to the 5th metatarsophalangeal joint with underlying soft tissue inflammatory changes. No organized fluid collection or foreign body identified. Dorsal subcutaneous edema extends into the medial aspect of the forefoot without focal fluid collection. Decreased T1 signal within the subcutaneous fat plantar to the proximal phalanx of the great toe  is without apparent soft tissue ulceration and likely represents an incidental pressure lesion. IMPRESSION: 1. Apparent soft tissue ulceration lateral to the 5th metatarsophalangeal joint with underlying soft tissue inflammatory changes and marrow signal abnormalities in the 5th metatarsal head and 5th proximal phalanx, suspicious for osteomyelitis. Correlate clinically. 2. No evidence of soft tissue abscess. 3. Additional signal changes in the plantar subcutaneous fat of the proximal great toe, presumably a pressure lesion in the absence of soft tissue ulceration in this area. Correlate clinically. 4. Dorsal subcutaneous edema extends into the medial aspect of the forefoot without focal fluid collection. Electronically Signed   By: Elsie Perone M.D.   On: 03/16/2024 13:54    Assessment  Diabetic foot infection, right foot Suspected acute  osteomyelitis, right foot  We discussed different interventions that are available to us  currently.  This included both conservative care -specifically in regards to local wound care and IV antibiotics versus different course of p.o. antibiotics.  Given this is failed in the outpatient setting and wound is fairly deep, I recommended at minimum there is surgical wound debridement/washout.  Patient is aware and amenable to this recommendation.  We discussed further surgical intervention in form of potential partial fifth ray amputation to include the fifth digit.  Patient is hesitant on this recommendation despite MRI findings that are suggestive of proximal phalanx and fifth metatarsal involvement/potential infection.  We discussed possibility of washout, along with bone biopsy specimen.  I do not believe this will impact the function further than what patient is already experiencing, and can give us  better directive for any specific duration of antibiotic usage/assist in controlling the infection/wound burden.  Patient wishes to speak more with infectious disease consult  if possible prior to making decision on extent of surgical intervention that is warranted for source control.  I relayed that patient is systemically stable currently, and there is a probability of his infection progressing versus with the appropriate antibiotics and local wound care treatment -potentially ridding the infection and creating a less acute state for the patient.  Patient is aware.   Plan  - ABX: Appreciate Medicine / ID / Pharmacy assist with antibiotic stewardship and appropriate coverage.  Infectious disease consultation requested by patient. - Diet: Per primary - Activity: Primarily heel weightbearing as tolerated on the right lower extremity. - Wound Care: Current dressing recommendations 03/16/2024 - Santyl or Iodosorb to wound base, gauze covering, ABD pad, Kerlix, Ace.  Dressings to be changed and tailored post definitive wound care plan in place.  Patient is aware.    Greig KANDICE Blush 03/16/2024, 5:19 PM

## 2024-03-16 NOTE — ED Provider Notes (Signed)
 [facility] Provider Note    Event Date/Time   First MD Initiated Contact with Patient 03/16/24 1228     (approximate)   History   Diabetic foot infection  HPI  Christian Flores is a 63 y.o. male with a history of CKD, diabetes, hypertension who was seen by his podiatrist Dr. Lennie yesterday and referred to the ED for concern for acute osteomyelitis of the right foot     Physical Exam   Triage Vital Signs: ED Triage Vitals  Encounter Vitals Group     BP 03/16/24 1219 121/77     Girls Systolic BP Percentile --      Girls Diastolic BP Percentile --      Boys Systolic BP Percentile --      Boys Diastolic BP Percentile --      Pulse Rate 03/16/24 1219 90     Resp 03/16/24 1219 17     Temp 03/16/24 1219 98.4 F (36.9 C)     Temp Source 03/16/24 1219 Oral     SpO2 03/16/24 1219 94 %     Weight 03/16/24 1214 81.6 kg (180 lb)     Height 03/16/24 1214 1.727 m (5' 8)     Head Circumference --      Peak Flow --      Pain Score 03/16/24 1214 7     Pain Loc --      Pain Education --      Exclude from Growth Chart --     Most recent vital signs: Vitals:   03/16/24 1219  BP: 121/77  Pulse: 90  Resp: 17  Temp: 98.4 F (36.9 C)  SpO2: 94%     General: Awake, no distress.  CV:  Good peripheral perfusion.  Normal heart rate Resp:  Normal effort.  Abd:  No distention.  Other:  Right lateral foot ulceration   ED Results / Procedures / Treatments   Labs (all labs ordered are listed, but only abnormal results are displayed) Labs Reviewed  COMPREHENSIVE METABOLIC PANEL WITH GFR - Abnormal; Notable for the following components:      Result Value   Chloride 95 (*)    Glucose, Bld 149 (*)    BUN 27 (*)    Total Protein 8.4 (*)    All other components within normal limits  CBC WITH DIFFERENTIAL/PLATELET - Abnormal; Notable for the following components:   Hemoglobin 12.6 (*)    HCT 38.2 (*)    Monocytes Absolute 1.3 (*)    All other  components within normal limits  LACTIC ACID, PLASMA  LACTIC ACID, PLASMA     EKG     RADIOLOGY MRI pending    PROCEDURES:  Critical Care performed:   Procedures   MEDICATIONS ORDERED IN ED: Medications  Ampicillin-Sulbactam (UNASYN) 3 g in sodium chloride 0.9 % 100 mL IVPB (has no administration in time range)    And  vancomycin  (VANCOCIN ) IVPB 1000 mg/200 mL premix (has no administration in time range)     IMPRESSION / MDM / ASSESSMENT AND PLAN / ED COURSE  I reviewed the triage vital signs and the nursing notes. Patient's presentation is most consistent with severe exacerbation of chronic illness.  Patient seen by his podiatrist Dr. Lennie, referred to the emergency department for IV antibiotics, MRI, possible surgery/amputation  Lab work is overall reassuring, normal lactic acid, white blood cell count  MRI ordered, IV antibiotics ordered  Will admit to the hospitalist team, have reached out  to Dr. Tanda of podiatry        FINAL CLINICAL IMPRESSION(S) / ED DIAGNOSES   Final diagnoses:  Osteomyelitis of foot, unspecified laterality, unspecified type (HCC)     Rx / DC Orders   ED Discharge Orders     None        Note:  This document was prepared using Dragon voice recognition software and may include unintentional dictation errors.   Arlander Charleston, MD 03/16/24 [phone]

## 2024-03-16 NOTE — H&P (Signed)
 History and Physical    Sanchez Hemmer FMW:[id number] DOB: Jul 07, 1960 DOA: 03/16/2024  PCP: System, Provider Not In (Confirm with patient/family/NH records and if not entered, this has to be entered at [facility] point of entry) Patient coming from: Home  I have personally briefly reviewed patient's old medical records in [facility]  Chief Complaint: Right foot ulcer infection  HPI: Christian Flores is a 63 y.o. male with medical history significant of IIDM on diet control, diabetic neuropathy, CKD stage II, HTN, presented with worsening of right foot ulcer infection.  Patient first developed a ulcer on the lateral side of right foot about 3 weeks ago, he has been doing self care with dressing changes however the ulcer gradually become thicker and with more purulent discharge.  Went to see podiatry yesterday who recommended p.o. antibiotics and sent patient to ED.  Patient denies any fever or chills.  ED Course: Afebrile, nontachycardic hypotension not hypoxic.  MRI positive for fifth metatarsal head and proximal phalanx osteomyelitis.  Patient was started on vancomycin  and Zosyn in the ED.  Review of Systems: As per HPI otherwise 14 point review of systems negative.   Past Medical History:  Diagnosis Date   CKD (chronic kidney disease), stage II    Diabetes mellitus without complication (HCC)    Hypertension     History reviewed. No pertinent surgical history.   reports that he has never smoked. He has never used smokeless tobacco. He reports current drug use. Drug: Marijuana. He reports that he does not drink alcohol.  No Known Allergies  History reviewed. No pertinent family history.   Prior to Admission medications   Medication Sig Start Date End Date Taking? Authorizing Provider  HYDROcodone -acetaminophen  (NORCO) 5-325 MG tablet Take 1 tablet by mouth every 6 (six) hours as needed for moderate pain. 05/12/19   Robinette Vermell PARAS, MD  ivabradine  (CORLANOR ) 7.5 MG TABS tablet Take  tablets (15mg ) TWO hours prior to your cardiac CT scan. 12/06/22   Darliss Rogue, MD  metoprolol  tartrate (LOPRESSOR ) 100 MG tablet Take tablet (100mg ) TWO hours prior to your cardiac CT scan. 12/05/22   Darliss Rogue, MD  ondansetron  (ZOFRAN  ODT) 4 MG disintegrating tablet Take 1 tablet (4 mg total) by mouth every 8 (eight) hours as needed for nausea or vomiting. 05/12/19   Robinette Vermell PARAS, MD    Physical Exam: Vitals:   03/16/24 1214 03/16/24 1218 03/16/24 1219  BP:   121/77  Pulse:   90  Resp:   17  Temp:   98.4 F (36.9 C)  TempSrc:   Oral  SpO2:   94%  Weight: 81.6 kg 81.6 kg   Height: 5' 8 (1.727 m) 5' 8 (1.727 m)     Constitutional: NAD, calm, comfortable Vitals:   03/16/24 1214 03/16/24 1218 03/16/24 1219  BP:   121/77  Pulse:   90  Resp:   17  Temp:   98.4 F (36.9 C)  TempSrc:   Oral  SpO2:   94%  Weight: 81.6 kg 81.6 kg   Height: 5' 8 (1.727 m) 5' 8 (1.727 m)    Eyes: PERRL, lids and conjunctivae normal ENMT: Mucous membranes are moist. Posterior pharynx clear of any exudate or lesions.Normal dentition.  Neck: normal, supple, no masses, no thyromegaly Respiratory: clear to auscultation bilaterally, no wheezing, no crackles. Normal respiratory effort. No accessory muscle use.  Cardiovascular: Regular rate and rhythm, no murmurs / rubs / gallops. No extremity edema. 2+ pedal pulses. No carotid bruits.  Abdomen: no tenderness, no masses palpated. No hepatosplenomegaly. Bowel sounds positive.  Musculoskeletal: no clubbing / cyanosis. No joint deformity upper and lower extremities. Good ROM, no contractures. Normal muscle tone.  Skin: Right foot lateral side large ulcer with purulent discharge and irregular border Neurologic: CN 2-12 grossly intact. Sensation intact, DTR normal. Strength 5/5 in all 4.  Psychiatric: Normal judgment and insight. Alert and oriented x 3. Normal mood.     Labs on Admission: I have personally reviewed following labs and imaging  studies  CBC: Recent Labs  Lab 03/16/24 1221  WBC 10.2  NEUTROABS 7.0  HGB 12.6*  HCT 38.2*  MCV 88.6  PLT 274   Basic Metabolic Panel: Recent Labs  Lab 03/16/24 1221  NA 136  K 4.2  CL 95*  CO2 30  GLUCOSE 149*  BUN 27*  CREATININE 1.05  CALCIUM 9.9   GFR: Estimated Creatinine Clearance: 71.5 mL/min (by C-G formula based on SCr of 1.05 mg/dL). Liver Function Tests: Recent Labs  Lab 03/16/24 1221  AST 26  ALT 30  ALKPHOS 83  BILITOT 0.6  PROT 8.4*  ALBUMIN 4.0   No results for input(s): LIPASE, AMYLASE in the last 168 hours. No results for input(s): AMMONIA in the last 168 hours. Coagulation Profile: No results for input(s): INR, PROTIME in the last 168 hours. Cardiac Enzymes: No results for input(s): CKTOTAL, CKMB, CKMBINDEX, TROPONINI in the last 168 hours. BNP (last 3 results) No results for input(s): PROBNP in the last 8760 hours. HbA1C: No results for input(s): HGBA1C in the last 72 hours. CBG: No results for input(s): GLUCAP in the last 168 hours. Lipid Profile: No results for input(s): CHOL, HDL, LDLCALC, TRIG, CHOLHDL, LDLDIRECT in the last 72 hours. Thyroid  Function Tests: No results for input(s): TSH, T4TOTAL, FREET4, T3FREE, THYROIDAB in the last 72 hours. Anemia Panel: No results for input(s): VITAMINB12, FOLATE, FERRITIN, TIBC, IRON, RETICCTPCT in the last 72 hours. Urine analysis: No results found for: COLORURINE, APPEARANCEUR, LABSPEC, PHURINE, GLUCOSEU, HGBUR, BILIRUBINUR, KETONESUR, PROTEINUR, UROBILINOGEN, NITRITE, LEUKOCYTESUR  Radiological Exams on Admission: MRI Right foot without contrast Result Date: 03/16/2024 CLINICAL DATA:  Soft tissue infection suspected, foot, xray done EXAM: MRI OF THE RIGHT FOREFOOT WITHOUT CONTRAST TECHNIQUE: Multiplanar, multisequence MR imaging of the right forefoot was performed. No intravenous contrast was administered.  COMPARISON:  None Available. FINDINGS: Bones/Joint/Cartilage Apparent area of soft tissue ulceration lateral to the 5th metatarsophalangeal joint, further described below. There are underlying signal abnormalities within the head of the 5th metatarsal and the 5th proximal phalanx, suspicious for osteomyelitis. Namely, there is increased T2 and decreased T1 marrow signal in these areas with suspected early cortical destruction of the 5th metatarsal head, best seen on the coronal T1 weighted images. T2 hyperintensity extends proximally into the mid shaft of the 5th metatarsal. There is a small effusion of the 5th metatarsophalangeal joint. The additional metatarsals and phalanges appear unremarkable. The alignment is normal at the Lisfranc joint. No significant tarsal bone abnormalities identified. Ligaments Intact Lisfranc ligament. The collateral ligaments of the metatarsophalangeal joints appear intact. Muscles and Tendons Mild generalized muscular T2 hyperintensity. The forefoot tendons appear intact, without significant tenosynovitis. Soft tissues As above, apparent soft tissue ulceration lateral to the 5th metatarsophalangeal joint with underlying soft tissue inflammatory changes. No organized fluid collection or foreign body identified. Dorsal subcutaneous edema extends into the medial aspect of the forefoot without focal fluid collection. Decreased T1 signal within the subcutaneous fat plantar to the proximal phalanx of the great  toe is without apparent soft tissue ulceration and likely represents an incidental pressure lesion. IMPRESSION: 1. Apparent soft tissue ulceration lateral to the 5th metatarsophalangeal joint with underlying soft tissue inflammatory changes and marrow signal abnormalities in the 5th metatarsal head and 5th proximal phalanx, suspicious for osteomyelitis. Correlate clinically. 2. No evidence of soft tissue abscess. 3. Additional signal changes in the plantar subcutaneous fat of the  proximal great toe, presumably a pressure lesion in the absence of soft tissue ulceration in this area. Correlate clinically. 4. Dorsal subcutaneous edema extends into the medial aspect of the forefoot without focal fluid collection. Electronically Signed   By: Elsie Perone M.D.   On: 03/16/2024 13:54    EKG: None  Assessment/Plan Principal Problem:   Foot ulcer (HCC) Active Problems:   Foot ulcer, right, with fat layer exposed (HCC)  (please populate well all problems here in Problem List. (For example, if patient is on BP meds at home and you resume or decide to hold them, it is a problem that needs to be her. 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  HTN - Stable - Monitor off BP meds.  IIDM - SSI for now  CKD stage II - Euvolemic, creatinine level stable.   DVT prophylaxis: Lovenox Code Status: Full code Family Communication: None at bedside Disposition Plan: Patient is sick with right foot osteomyelitis requiring IV antibiotics and inpatient podiatry intervention, expect more than 2 midnight hospital stay. Consults called: Podiatry Admission status: MedSurg admission   Cort ONEIDA Mana MD [facility] Pager [phone]  03/16/2024, 3:42 PM

## 2024-03-16 NOTE — ED Triage Notes (Addendum)
 Pt arrived to ED from home with ulcer X2 months to side right foot toe. Pt MD (Dr. Lennie and Dr. Tanda with [facility]) encouraged pt to come to ED for hospital admission, MRI, and IV antibiotics. Pt having difficulty ambulating due to his pain. Pt reports drainage from wound; states he is unsure if there is a foul odor.

## 2024-03-16 NOTE — Progress Notes (Addendum)
 Pharmacy Antibiotic Note  Christian Flores is a 63 y.o. male admitted on 03/16/2024 with wound infection. PMH significant for CKD, diabetes, hypertension. Patient was seen by his podiatrist yesterday and referred to ED for concern of acute OM of the right foot. Pharmacy has been consulted for Vancomycin  and Cefepime  dosing.  12/2: Patient's Scr is 1.05 (BL ~0.8-1.15). Will continue to monitor.  Plan: - Patient received Vancomycin  1000 mg IV loading dose ordered by ED provider - Will give additional Vancomycin  1000 mg IV x 1 dose to finish a total 2000 mg loading dose - Will start Vancomycin  maintenance dose at 1000 mg q12h (eAUC 533.6, Cmin 15.8, Scr 1.05, IBW used, Vd 0.72 L/kg) - Goal AUC 400-550 - Will start Cefepime  2 g q8h - Will continue to monitor renal function and signs of clinical improvement  Height: 5' 8 (172.7 cm) Weight: 81.6 kg (180 lb) IBW/kg (Calculated) : 68.4  Temp (24hrs), Avg:98.4 F (36.9 C), Min:98.4 F (36.9 C), Max:98.4 F (36.9 C)  Recent Labs  Lab 03/16/24 1221  WBC 10.2  CREATININE 1.05  LATICACIDVEN 1.4    Estimated Creatinine Clearance: 71.5 mL/min (by C-G formula based on SCr of 1.05 mg/dL).    No Known Allergies  Antimicrobials this admission: Unasyn x 1 Cefepime  12/2 >> Vancomycin  12/2 >>  Dose adjustments this admission:   Microbiology results: 12/2 MRSA PCR: ordered  Thank you for allowing pharmacy to be a part of this patient's care.   Ransom Blanch PGY-1 Pharmacy Resident   - [facility]  03/16/2024 3:54 PM

## 2024-03-17 DIAGNOSIS — I10 Essential (primary) hypertension: Secondary | ICD-10-CM | POA: Diagnosis not present

## 2024-03-17 DIAGNOSIS — E1169 Type 2 diabetes mellitus with other specified complication: Secondary | ICD-10-CM

## 2024-03-17 DIAGNOSIS — E11621 Type 2 diabetes mellitus with foot ulcer: Secondary | ICD-10-CM

## 2024-03-17 DIAGNOSIS — M869 Osteomyelitis, unspecified: Secondary | ICD-10-CM | POA: Diagnosis not present

## 2024-03-17 DIAGNOSIS — L97512 Non-pressure chronic ulcer of other part of right foot with fat layer exposed: Secondary | ICD-10-CM

## 2024-03-17 DIAGNOSIS — M86171 Other acute osteomyelitis, right ankle and foot: Secondary | ICD-10-CM

## 2024-03-17 DIAGNOSIS — L97419 Non-pressure chronic ulcer of right heel and midfoot with unspecified severity: Secondary | ICD-10-CM

## 2024-03-17 DIAGNOSIS — E785 Hyperlipidemia, unspecified: Secondary | ICD-10-CM

## 2024-03-17 DIAGNOSIS — E1151 Type 2 diabetes mellitus with diabetic peripheral angiopathy without gangrene: Secondary | ICD-10-CM

## 2024-03-17 DIAGNOSIS — Z794 Long term (current) use of insulin: Secondary | ICD-10-CM

## 2024-03-17 DIAGNOSIS — E663 Overweight: Secondary | ICD-10-CM

## 2024-03-17 LAB — BASIC METABOLIC PANEL WITH GFR
Anion gap: 10 (ref 5–15)
BUN: 25 mg/dL — ABNORMAL HIGH (ref 8–23)
CO2: 30 mmol/L (ref 22–32)
Calcium: 9.4 mg/dL (ref 8.9–10.3)
Chloride: 97 mmol/L — ABNORMAL LOW (ref 98–111)
Creatinine, Ser: 0.84 mg/dL (ref 0.61–1.24)
GFR, Estimated: >60 mL/min (ref >60)
Glucose, Bld: 135 mg/dL — ABNORMAL HIGH (ref 70–99)
Potassium: 4 mmol/L (ref 3.5–5.1)
Sodium: 136 mmol/L (ref 135–145)

## 2024-03-17 LAB — SEDIMENTATION RATE: Sed Rate: 83 mm/h — ABNORMAL HIGH (ref 0–20)

## 2024-03-17 LAB — GLUCOSE, CAPILLARY
Glucose-Capillary: 144 mg/dL — ABNORMAL HIGH (ref 70–99)
Glucose-Capillary: 135 mg/dL — ABNORMAL HIGH (ref 70–99)
Glucose-Capillary: 174 mg/dL — ABNORMAL HIGH (ref 70–99)
Glucose-Capillary: 181 mg/dL — ABNORMAL HIGH (ref 70–99)

## 2024-03-17 LAB — HEMOGLOBIN A1C
Hgb A1c MFr Bld: 7.6 % — ABNORMAL HIGH (ref 4.8–5.6)
Mean Plasma Glucose: 171 mg/dL

## 2024-03-17 MED ORDER — OXYCODONE HCL 5 MG PO TABS
5.0000 mg | ORAL_TABLET | Freq: Four times a day (QID) | ORAL | Status: DC | PRN
  Administered 2024-03-17 – 2024-03-19 (×4): 5 mg via ORAL
  Filled 2024-03-17 (×4): qty 1

## 2024-03-17 MED ORDER — GABAPENTIN 400 MG PO CAPS
800.0000 mg | ORAL_CAPSULE | Freq: Four times a day (QID) | ORAL | Status: DC
  Administered 2024-03-17 – 2024-03-19 (×11): 800 mg via ORAL
  Filled 2024-03-17 (×11): qty 2

## 2024-03-17 MED ORDER — HYDROMORPHONE HCL 1 MG/ML IJ SOLN
0.5000 mg | INTRAMUSCULAR | Status: DC | PRN
  Administered 2024-03-17 – 2024-03-19 (×2): 1 mg via INTRAVENOUS
  Filled 2024-03-17 (×2): qty 1

## 2024-03-17 NOTE — Progress Notes (Addendum)
 PODIATRY: PROGRESS NOTE     O/N: NAEON  Subjective:  Patient resting comfortably at bedside.  Denies F/C/N/V/SOB/CP. Denies acute calf pain.    PHYSICAL EXAMINATION: BP 131/71 (BP Location: Left Arm)   Pulse 80   Temp 98.8 F (37.1 C)   Resp 15   Ht 5' 8 (1.727 m)   Wt 81.6 kg   SpO2 99%   BMI 27.37 kg/m ? GEN: NAD. AOX3. ? RESP: Non-labored breathing on RA.? ABD: NT/ND of all four quadrants.? NEURO: Moving all four extremities spontaneously. ? ? FOCUSED LOWER EXTREMITY EXAMINATION: Right foot   Neurological:  - Protective sensation diminished / absent - Gross protective sensation diminished bilaterally.  - No focal motor or sensory deficits identified bilaterally.    Vascular:  - Dorsalis Pedis artery on palpation: 2 - Posterior Tibial artery on palpation: 1 - Capillary Filling Times: WNL - Peripheral edema: Localized to the right forefoot - Dependency Rubor: Mild - Varicosities: Mild     Musculoskeletal:  S/p second digit amputation, right foot Muscle strength: 5/5 in all 4 quadrants  Ankle Joint ROM: decreased, equinus noted   Dermatological:  - Skin quality: Atrophic - Interdigital spaces: c/d/i    #WOUND 1    Type: Full thickness neuropathic ulceration Location: Right lateral fifth metatarsal head --- Wound base: 30% Granular ; 50% Fibrosis ; 20% Necrotic  Adjacent tissue/ Wound borders: Rolled PTB: Probes to fifth metatarsal capsule/bone Malodor: Mild Active Drainage: None; If yes, consistency: Purulent drainage noted in clinic  ASSESSMENT:?  Diabetic foot infection, right foot Suspected acute osteomyelitis, right foot  Long discussion with patient today regarding infectious disease consultation and further recommendations dispensed by podiatry team.  I believe that patient is stable enough that if he is wanting to attempt wound care and antibiotic course, this is a reasonable option.  He was advised that infection still may progress,  necessitating amputation.  He is aware that a more definitive treatment option for clearance of infection would be amputation, however he would like to attempt local wound care and IV antibiotics currently.  Podiatry in agreement.  We discussed irrigation and debridement of all nonviable soft tissue and bone, right foot.  All patient questions were answered to patient's satisfaction and provider ability.  No guarantees were made.  Patient understands the risks benefits and alternatives to the planned/recommended procedure listed above.  Plan   - ABX: Appreciate Medicine / ID / Pharmacy assist with antibiotic stewardship and appropriate coverage.  Infectious disease consultation requested by patient. - Diet: N.p.o. at 7 AM for planned procedure tomorrow 03/18/2024 - Activity: Primarily heel weightbearing as tolerated on the right lower extremity. - Wound Care: Current dressing recommendations 03/16/2024 - Santyl or Iodosorb to wound base, gauze covering, ABD pad, Kerlix, Ace.  Dressings to be changed and tailored post definitive wound care plan in place.  Patient is aware.    - ABX: Appreciate assistance with antibiotic stewardship from medicine / pharmacy / ID services.  Anti-infectives (From admission, onward)    Start     Dose/Rate Route Frequency Ordered Stop   03/17/24 0500  vancomycin  (VANCOCIN ) IVPB 1000 mg/200 mL premix        1,000 mg 200 mL/hr over 60 Minutes Intravenous Every 12 hours 03/16/24 1535     03/16/24 2000  Ampicillin -Sulbactam (UNASYN ) 3 g in sodium chloride  0.9 % 100 mL IVPB  Status:  Discontinued        3 g 200 mL/hr over 30 Minutes Intravenous Every 6  hours 03/16/24 1530 03/16/24 1544   03/16/24 2000  ceFEPIme  (MAXIPIME ) 2 g in sodium chloride 0.9 % 100 mL IVPB        2 g 200 mL/hr over 30 Minutes Intravenous Every 8 hours 03/16/24 1601     03/16/24 1545  vancomycin  (VANCOCIN ) IVPB 1000 mg/200 mL premix        1,000 mg 200 mL/hr over 60 Minutes Intravenous  Once  03/16/24 1530 03/16/24 2042   03/16/24 1315  Ampicillin-Sulbactam (UNASYN) 3 g in sodium chloride 0.9 % 100 mL IVPB       Placed in And Linked Group   3 g 200 mL/hr over 30 Minutes Intravenous  Once 03/16/24 1311 03/16/24 1454   03/16/24 1315  vancomycin  (VANCOCIN ) IVPB 1000 mg/200 mL premix       Placed in And Linked Group   1,000 mg 200 mL/hr over 60 Minutes Intravenous  Once 03/16/24 1311 03/16/24 1600       - Dispo: Pending surgical intervention and day after postop check from podiatry standpoint

## 2024-03-17 NOTE — Consult Note (Signed)
 Infectious Disease     Reason for Consult:DFI with osteomyelitis    Referring Physician: Josette Ade, MD  Date of Admission:  03/16/2024   Principal Problem:   Foot ulcer (HCC) Active Problems:   Foot ulcer, right, with fat layer exposed (HCC)   HPI: Christian Flores is a 63 y.o. male with a history of diabetes, neuropathy, chronic disease and hypertension who was admitted December 2 with the ulcer on the lateral side of his foot which began about 3 weeks ago.  There was increasing purulence from the wound and he was seen by podiatry yesterday and was recommended he be admitted. On admit he is afebrile white count 10.2.  Sed rate was elevated at 83.  Creatinine is normal.  A1c is pending.  MRI of the foot reveals soft tissue ulceration lateral to the fifth metatarsal phalangeal joint with underlying soft tissue inflammatory changes and marrow signals in the fifth metatarsal head and fifth proximal phalanx suspicious for osteomyelitis.  There were also some changes in the plantar subcutaneous fat of the proximal great toe and dorsal subcutaneous edema into the medial aspect of the forefoot. 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  He was treated with an oral 6-week course of doxycycline 2 tablets daily and finished this a few weeks ago;        Past Medical History:  Diagnosis Date   CKD (chronic kidney disease), stage II    Diabetes mellitus without complication (HCC)    Hypertension    History reviewed. No pertinent surgical history. Social History   Tobacco Use   Smoking status: Never   Smokeless tobacco: Never  Vaping Use   Vaping status: Never Used  Substance Use Topics   Alcohol use: Never   Drug use: Yes    Types: Marijuana   History reviewed. No pertinent family history.  Allergies: No  Known Allergies  Current antibiotics: Antibiotics Given (last 72 hours)     Date/Time Action Medication Dose Rate   03/16/24 1353 New Bag/Given   Ampicillin-Sulbactam (UNASYN) 3 g in sodium chloride 0.9 % 100 mL IVPB 3 g 200 mL/hr   03/16/24 1500 New Bag/Given   vancomycin  (VANCOCIN ) IVPB 1000 mg/200 mL premix 1,000 mg 200 mL/hr   03/16/24 1729 New Bag/Given   vancomycin  (VANCOCIN ) IVPB 1000 mg/200 mL premix 1,000 mg 200 mL/hr   03/16/24 2041 New Bag/Given   ceFEPIme  (MAXIPIME ) 2 g in sodium chloride 0.9 % 100 mL IVPB 2 g 200 mL/hr   03/17/24 0422 New Bag/Given   ceFEPIme  (MAXIPIME ) 2 g in sodium chloride 0.9 % 100 mL IVPB 2 g 200 mL/hr   03/17/24 0534 New Bag/Given   vancomycin  (VANCOCIN ) IVPB 1000 mg/200 mL premix 1,000 mg 200 mL/hr   03/17/24 1121 New Bag/Given   ceFEPIme  (MAXIPIME ) 2 g in sodium chloride 0.9 % 100 mL IVPB 2 g 200 mL/hr       MEDICATIONS:  atorvastatin  40 mg Oral Daily   empagliflozin  10 mg Oral Daily   enoxaparin (LOVENOX) injection  40 mg Subcutaneous Q24H   gabapentin  800 mg Oral QID   insulin aspart  0-15 Units Subcutaneous TID WC   metoprolol  tartrate  50 mg Oral BID   pantoprazole  40 mg Oral Daily    Review  of Systems - 11 systems reviewed and negative per HPI   OBJECTIVE: Temp:  [97.9 F (36.6 C)-99.1 F (37.3 C)] 97.9 F (36.6 C) (12/03 0745) Pulse Rate:  [81-91] 91 (12/03 0745) Resp:  [16-18] 16 (12/03 0745) BP: (130-155)/(75-79) 137/75 (12/03 0745) SpO2:  [98 %-99 %] 98 % (12/03 0745) Physical Exam  Constitutional: He is oriented to person, place, and time. He appears well-developed and well-nourished. No distress.  HENT: anicterc  Mouth/Throat: Oropharynx is clear and moist. No oropharyngeal exudate.  Cardiovascular: Normal rate, regular rhythm and normal heart sounds. Exam reveals no gallop and no friction rub.  No murmur heard.  Pulmonary/Chest: Effort normal and breath sounds normal. No respiratory distress. He has no wheezes.   Abdominal: Soft. Bowel sounds are normal. He exhibits no distension. There is no tenderness.  Lymphadenopathy:  He has no cervical adenopathy.  Neurological: He is alert and oriented to person, place, and time.  Skin: see photo Psychiatric: He has a normal mood and affect. His behavior is normal.     Admit photo      LABS: Results for orders placed or performed during the hospital encounter of 03/16/24 (from the past 48 hours)  Comprehensive metabolic panel     Status: Abnormal   Collection Time: 03/16/24 12:21 PM  Result Value Ref Range   Sodium 136 135 - 145 mmol/L   Potassium 4.2 3.5 - 5.1 mmol/L   Chloride 95 (L) 98 - 111 mmol/L   CO2 30 22 - 32 mmol/L   Glucose, Bld 149 (H) 70 - 99 mg/dL    Comment: Glucose reference range applies only to samples taken after fasting for at least 8 hours.   BUN 27 (H) 8 - 23 mg/dL   Creatinine, Ser 8.94 0.61 - 1.24 mg/dL   Calcium 9.9 8.9 - 89.6 mg/dL   Total Protein 8.4 (H) 6.5 - 8.1 g/dL   Albumin 4.0 3.5 - 5.0 g/dL   AST 26 15 - 41 U/L   ALT 30 0 - 44 U/L   Alkaline Phosphatase 83 38 - 126 U/L   Total Bilirubin 0.6 0.0 - 1.2 mg/dL   GFR, Estimated >39 >39 mL/min    Comment: (NOTE) Calculated using the CKD-EPI Creatinine Equation (2021)    Anion gap 12 5 - 15    Comment: Performed at [facility], [address] [place], [place] [address]  CBC with Differential     Status: Abnormal   Collection Time: 03/16/24 12:21 PM  Result Value Ref Range   WBC 10.2 4.0 - 10.5 K/uL   RBC 4.31 4.22 - 5.81 MIL/uL   Hemoglobin 12.6 (L) 13.0 - 17.0 g/dL   HCT 61.7 (L) 60.9 - 47.9 %   MCV 88.6 80.0 - 100.0 fL   MCH 29.2 26.0 - 34.0 pg   MCHC 33.0 30.0 - 36.0 g/dL   RDW 85.7 88.4 - 84.4 %   Platelets 274 150 - 400 K/uL   nRBC 0.0 0.0 - 0.2 %   Neutrophils Relative % 67 %   Neutro Abs 7.0 1.7 - 7.7 K/uL   Lymphocytes Relative 17 %   Lymphs Abs 1.7 0.7 - 4.0 K/uL   Monocytes Relative 13 %   Monocytes Absolute 1.3 (H) 0.1 - 1.0  K/uL   Eosinophils Relative 2 %   Eosinophils Absolute 0.2 0.0 - 0.5 K/uL   Basophils Relative 1 %   Basophils Absolute 0.1 0.0 - 0.1 K/uL   Immature Granulocytes 0 %  Abs Immature Granulocytes 0.04 0.00 - 0.07 K/uL    Comment: Performed at [facility], [address] [place], [place] [address]  Lactic acid, plasma     Status: None   Collection Time: 03/16/24 12:21 PM  Result Value Ref Range   Lactic Acid, Venous 1.4 0.5 - 1.9 mmol/L    Comment: Performed at [facility], [address] [place], [place] [address]  Glucose, capillary     Status: Abnormal   Collection Time: 03/16/24  4:59 PM  Result Value Ref Range   Glucose-Capillary 111 (H) 70 - 99 mg/dL    Comment: Glucose reference range applies only to samples taken after fasting for at least 8 hours.  Lactic acid, plasma     Status: None   Collection Time: 03/16/24  5:17 PM  Result Value Ref Range   Lactic Acid, Venous 1.5 0.5 - 1.9 mmol/L    Comment: Performed at [facility], [address] [place], [place] [address]  HIV Antibody (routine testing w rflx)     Status: None   Collection Time: 03/16/24  5:17 PM  Result Value Ref Range   HIV Screen 4th Generation wRfx Non Reactive Non Reactive    Comment: Performed at [facility], [address], [place], [place] [address]  Glucose, capillary     Status: Abnormal   Collection Time: 03/16/24  7:46 PM  Result Value Ref Range   Glucose-Capillary 186 (H) 70 - 99 mg/dL    Comment: Glucose reference range applies only to samples taken after fasting for at least 8 hours.  Basic metabolic panel     Status: Abnormal   Collection Time: 03/17/24  5:29 AM  Result Value Ref Range   Sodium 136 135 - 145 mmol/L   Potassium 4.0 3.5 - 5.1 mmol/L    Comment: HEMOLYSIS AT THIS LEVEL MAY AFFECT RESULT   Chloride 97 (L) 98 - 111 mmol/L   CO2 30 22 - 32 mmol/L   Glucose, Bld 135 (H) 70 - 99 mg/dL    Comment: Glucose reference range applies only to  samples taken after fasting for at least 8 hours.   BUN 25 (H) 8 - 23 mg/dL   Creatinine, Ser 9.15 0.61 - 1.24 mg/dL   Calcium 9.4 8.9 - 89.6 mg/dL   GFR, Estimated >39 >39 mL/min    Comment: (NOTE) Calculated using the CKD-EPI Creatinine Equation (2021)    Anion gap 10 5 - 15    Comment: Performed at [facility], [address] [place], [place] [address]  Glucose, capillary     Status: Abnormal   Collection Time: 03/17/24  7:48 AM  Result Value Ref Range   Glucose-Capillary 144 (H) 70 - 99 mg/dL    Comment: Glucose reference range applies only to samples taken after fasting for at least 8 hours.  Sedimentation rate     Status: Abnormal   Collection Time: 03/17/24  7:49 AM  Result Value Ref Range   Sed Rate 83 (H) 0 - 20 mm/hr    Comment: Performed at [facility], [address] [place], [place] [address]  Glucose, capillary     Status: Abnormal   Collection Time: 03/17/24 11:56 AM  Result Value Ref Range   Glucose-Capillary 135 (H) 70 - 99 mg/dL    Comment: Glucose reference range applies only to samples taken after fasting for at least 8 hours.   No components found for: ESR, C REACTIVE PROTEIN MICRO:  No results found for this or any previous visit (from the past 720 hours).  IMAGING: US  ARTERIAL ABI (SCREENING LOWER EXTREMITY) Result Date: 03/16/2024 CLINICAL DATA:  Diabetic infection of the right foot. EXAM: NONINVASIVE PHYSIOLOGIC VASCULAR STUDY OF BILATERAL LOWER EXTREMITIES TECHNIQUE: Evaluation of both lower extremities were performed at rest, including calculation of ankle-brachial indices with single level Doppler, pressure and pulse volume recording. COMPARISON:  None Available. FINDINGS: Right ABI:  1.07 Left ABI:  1.07 Right Lower Extremity: Monophasic posterior tibial and dorsalis pedis waveforms. Left Lower Extremity: Biphasic/monophasic posterior tibial and monophasic dorsalis pedis waveforms. 1.0-1.4 Normal IMPRESSION: Normal bilateral  resting ankle-brachial indices. Distal waveform abnormalities would implicate underlying tibial disease bilaterally. Electronically Signed   By: Marcey Moan M.D.   On: 03/16/2024 17:08   MRI Right foot without contrast Result Date: 03/16/2024 CLINICAL DATA:  Soft tissue infection suspected, foot, xray done EXAM: MRI OF THE RIGHT FOREFOOT WITHOUT CONTRAST TECHNIQUE: Multiplanar, multisequence MR imaging of the right forefoot was performed. No intravenous contrast was administered. COMPARISON:  None Available. FINDINGS: Bones/Joint/Cartilage Apparent area of soft tissue ulceration lateral to the 5th metatarsophalangeal joint, further described below. There are underlying signal abnormalities within the head of the 5th metatarsal and the 5th proximal phalanx, suspicious for osteomyelitis. Namely, there is increased T2 and decreased T1 marrow signal in these areas with suspected early cortical destruction of the 5th metatarsal head, best seen on the coronal T1 weighted images. T2 hyperintensity extends proximally into the mid shaft of the 5th metatarsal. There is a small effusion of the 5th metatarsophalangeal joint. The additional metatarsals and phalanges appear unremarkable. The alignment is normal at the Lisfranc joint. No significant tarsal bone abnormalities identified. Ligaments Intact Lisfranc ligament. The collateral ligaments of the metatarsophalangeal joints appear intact. Muscles and Tendons Mild generalized muscular T2 hyperintensity. The forefoot tendons appear intact, without significant tenosynovitis. Soft tissues As above, apparent soft tissue ulceration lateral to the 5th metatarsophalangeal joint with underlying soft tissue inflammatory changes. No organized fluid collection or foreign body identified. Dorsal subcutaneous edema extends into the medial aspect of the forefoot without focal fluid collection. Decreased T1 signal within the subcutaneous fat plantar to the proximal phalanx of the  great toe is without apparent soft tissue ulceration and likely represents an incidental pressure lesion. IMPRESSION: 1. Apparent soft tissue ulceration lateral to the 5th metatarsophalangeal joint with underlying soft tissue inflammatory changes and marrow signal abnormalities in the 5th metatarsal head and 5th proximal phalanx, suspicious for osteomyelitis. Correlate clinically. 2. No evidence of soft tissue abscess. 3. Additional signal changes in the plantar subcutaneous fat of the proximal great toe, presumably a pressure lesion in the absence of soft tissue ulceration in this area. Correlate clinically. 4. Dorsal subcutaneous edema extends into the medial aspect of the forefoot without focal fluid collection. Electronically Signed   By: Elsie Perone M.D.   On: 03/16/2024 13:54    Assessment:   Grahm Etsitty is a 63 y.o. male with diabetes and peripheral neuropathy with several weeks of R foot metatarsal ulceration progresssed to possible underlying osteomyelitis of the fifth metatarsal head and fifth proximal phalanx based on MRI.  Recommendations Overall it is difficult to say if he truly has underlying osteomyelitis although the ulcer is very close to the bone.  Clinically it seems to have improved from the initial photo.  His sed rate is elevated at 80 but it had been elevated to 71 a few weeks ago when he was being treated for hand  infection. Discussed the case with Dr. Tanda.  At this point options would include initial soft tissue debridement and cultures and evaluation of the bone.  I discussed with him that if the bone does appear markedly infected it would probably be best to remove it and then do a shorter course of antibiotics.  However if he elects to preserve the bone I would suggest we do 6 weeks of IV antibiotics based on culture results. He thinks he be able to do a PICC line at home and would be able to tolerate the antibiotics. He will discuss further with Dr. Tanda and make  his decision. Thank you very much for allowing me to participate in the care of this patient. Please call with questions.   Alm SQUIBB. Epifanio, MD

## 2024-03-17 NOTE — Hospital Course (Signed)
 63 y.o. male with medical history significant of IIDM on diet control, diabetic neuropathy, CKD stage II, HTN, presented with worsening of right foot ulcer infection.   Patient first developed a ulcer on the lateral side of right foot about 3 weeks ago, he has been doing self care with dressing changes however the ulcer gradually become thicker and with more purulent discharge.  Went to see podiatry yesterday who recommended p.o. antibiotics and sent patient to ED.  Patient denies any fever or chills.   ED Course: Afebrile, nontachycardic hypotension not hypoxic.  MRI positive for fifth metatarsal head and proximal phalanx osteomyelitis.   Patient was started on vancomycin  and Zosyn in the ED.  12/3.  Patient wants to avoid amputation for osteomyelitis.  Will get infectious disease consultation. 12/4.  Patient to go to the operating room for cleanout today.  Again, does not want amputation.  Will likely need 6 weeks of IV antibiotics. 12/5.  Patient feels okay.  Some pain in his foot.  PICC line ordered.  Infusion nurse to see patient to see if he is physically able to give himself antibiotic.  Patient did well with teaching for IV antibiotics.  Antibiotics will be delivered tonight.  Patient wants to go home.

## 2024-03-17 NOTE — Progress Notes (Signed)
  Progress Note   Patient: Christian Flores FMW:[id number] DOB: 05-26-1960 DOA: 03/16/2024     1 DOS: the patient was seen and examined on 03/17/2024   Brief hospital course: 63 y.o. male with medical history significant of IIDM on diet control, diabetic neuropathy, CKD stage II, HTN, presented with worsening of right foot ulcer infection.   Patient first developed a ulcer on the lateral side of right foot about 3 weeks ago, he has been doing self care with dressing changes however the ulcer gradually become thicker and with more purulent discharge.  Went to see podiatry yesterday who recommended p.o. antibiotics and sent patient to ED.  Patient denies any fever or chills.   ED Course: Afebrile, nontachycardic hypotension not hypoxic.  MRI positive for fifth metatarsal head and proximal phalanx osteomyelitis.   Patient was started on vancomycin  and Zosyn in the ED.  12/3.  Patient wants to avoid amputation for osteomyelitis.  Will get infectious disease consultation.  Assessment and Plan: * Osteomyelitis of fifth toe of right foot [facility]) Patient hoping to avoid amputation.  Seen by podiatry.  ID consultation.  Currently on vancomycin  and cefepime .  Foot ulcer, right, with fat layer exposed [facility]) Appreciate podiatry consultation.  Type 2 diabetes mellitus with hyperlipidemia [facility]) Patient takes Trulicity weekly at home.  On sliding scale insulin  here and Jardiance   Essential hypertension On metoprolol   Overweight (BMI 25.0-29.9) BMI 27.37        Subjective: Patient hoping to avoid amputation for osteomyelitis in his foot ulcer.  Admitted with foot infection.  Physical Exam: Vitals:   03/16/24 1608 03/16/24 1945 03/17/24 0418 03/17/24 0745  BP: (!) 155/79 136/79 130/78 137/75  Pulse: 89 83 81 91  Resp: 18 18 18 16   Temp: 99.1 F (37.3 C) 98.5 F (36.9 C) 98.3 F (36.8 C) 97.9 F (36.6 C)  TempSrc:  Oral    SpO2: 98% 98% 99% 98%  Weight:      Height:       Physical  Exam HENT:     Head: Normocephalic.     Mouth/Throat:     Pharynx: No oropharyngeal exudate.  Eyes:     General: Lids are normal.     Conjunctiva/sclera: Conjunctivae normal.  Cardiovascular:     Rate and Rhythm: Normal rate and regular rhythm.     Heart sounds: Normal heart sounds, S1 normal and S2 normal.  Pulmonary:     Breath sounds: No decreased breath sounds, wheezing, rhonchi or rales.  Abdominal:     Palpations: Abdomen is soft.     Tenderness: There is no abdominal tenderness.  Musculoskeletal:     Right lower leg: No swelling.     Left lower leg: No swelling.  Skin:    General: Skin is warm.     Comments: Right foot covered  Neurological:     Mental Status: He is alert and oriented to person, place, and time.     Data Reviewed: Creatinine 0.84, sedimentation rate 83, white blood cell count 10.2, hemoglobin 12.6, platelet count 274    Disposition: Status is: Inpatient Remains inpatient appropriate because: Continue IV antibiotics.  Podiatry following and ID consultation.  Since patient wants to avoid amputation may need long-term antibiotic.  Planned Discharge Destination: Home    Time spent: 28 minutes  Author: Charlie Patterson, MD 03/17/2024 3:04 PM  For on call review [email or web address].

## 2024-03-17 NOTE — Assessment & Plan Note (Signed)
 Patient takes Trulicity weekly at home.  On sliding scale insulin here and Jardiance.  Hold Jardiance today while n.p.o.

## 2024-03-17 NOTE — Assessment & Plan Note (Signed)
-   BMI 27.37

## 2024-03-17 NOTE — Assessment & Plan Note (Signed)
 Patient hoping to avoid amputation.  Seen by podiatry.  ID consultation.  Currently on vancomycin  and cefepime .

## 2024-03-17 NOTE — Assessment & Plan Note (Signed)
 On metoprolol

## 2024-03-17 NOTE — Assessment & Plan Note (Signed)
 Appreciate podiatry consultation.

## 2024-03-18 ENCOUNTER — Inpatient Hospital Stay: Admitting: Anesthesiology

## 2024-03-18 ENCOUNTER — Encounter: Admission: EM | Disposition: A | Payer: Self-pay | Source: Home / Self Care | Attending: Internal Medicine

## 2024-03-18 ENCOUNTER — Encounter: Payer: Self-pay | Admitting: Internal Medicine

## 2024-03-18 ENCOUNTER — Other Ambulatory Visit: Payer: Self-pay

## 2024-03-18 DIAGNOSIS — E1169 Type 2 diabetes mellitus with other specified complication: Secondary | ICD-10-CM | POA: Diagnosis not present

## 2024-03-18 DIAGNOSIS — M869 Osteomyelitis, unspecified: Secondary | ICD-10-CM | POA: Diagnosis not present

## 2024-03-18 DIAGNOSIS — L97512 Non-pressure chronic ulcer of other part of right foot with fat layer exposed: Secondary | ICD-10-CM | POA: Diagnosis not present

## 2024-03-18 DIAGNOSIS — I10 Essential (primary) hypertension: Secondary | ICD-10-CM | POA: Diagnosis not present

## 2024-03-18 LAB — GLUCOSE, CAPILLARY
Glucose-Capillary: 154 mg/dL — ABNORMAL HIGH (ref 70–99)
Glucose-Capillary: 155 mg/dL — ABNORMAL HIGH (ref 70–99)
Glucose-Capillary: 107 mg/dL — ABNORMAL HIGH (ref 70–99)
Glucose-Capillary: 126 mg/dL — ABNORMAL HIGH (ref 70–99)
Glucose-Capillary: 181 mg/dL — ABNORMAL HIGH (ref 70–99)

## 2024-03-18 LAB — SURGICAL PCR SCREEN
MRSA, PCR: NEGATIVE
Staphylococcus aureus: NEGATIVE

## 2024-03-18 SURGERY — IRRIGATION AND DEBRIDEMENT WOUND
Anesthesia: General | Laterality: Right

## 2024-03-18 MED ORDER — SODIUM CHLORIDE 0.9 % IR SOLN
Status: DC | PRN
  Administered 2024-03-18: 3000 mL

## 2024-03-18 MED ORDER — MIDAZOLAM HCL (PF) 2 MG/2ML IJ SOLN
INTRAMUSCULAR | Status: DC | PRN
  Administered 2024-03-18: 2 mg via INTRAVENOUS

## 2024-03-18 MED ORDER — LIDOCAINE HCL (PF) 1 % IJ SOLN
INTRAMUSCULAR | Status: AC
  Filled 2024-03-18: qty 30

## 2024-03-18 MED ORDER — BUPIVACAINE HCL 0.5 % IJ SOLN
INTRAMUSCULAR | Status: DC | PRN
  Administered 2024-03-18: 5 mL

## 2024-03-18 MED ORDER — LIDOCAINE HCL 1 % IJ SOLN
INTRAMUSCULAR | Status: DC | PRN
  Administered 2024-03-18: 5 mL

## 2024-03-18 MED ORDER — DEXMEDETOMIDINE HCL IN NACL 80 MCG/20ML IV SOLN
INTRAVENOUS | Status: DC | PRN
  Administered 2024-03-18: 8 ug via INTRAVENOUS
  Administered 2024-03-18: 12 ug via INTRAVENOUS

## 2024-03-18 MED ORDER — GLYCOPYRROLATE 0.2 MG/ML IJ SOLN
INTRAMUSCULAR | Status: DC | PRN
  Administered 2024-03-18: .1 mg via INTRAVENOUS

## 2024-03-18 MED ORDER — FENTANYL CITRATE (PF) 100 MCG/2ML IJ SOLN
INTRAMUSCULAR | Status: AC
  Filled 2024-03-18: qty 2

## 2024-03-18 MED ORDER — BUPIVACAINE HCL (PF) 0.5 % IJ SOLN
INTRAMUSCULAR | Status: AC
  Filled 2024-03-18: qty 30

## 2024-03-18 MED ORDER — METRONIDAZOLE 500 MG PO TABS
500.0000 mg | ORAL_TABLET | Freq: Two times a day (BID) | ORAL | Status: DC
  Administered 2024-03-18 – 2024-03-19 (×2): 500 mg via ORAL
  Filled 2024-03-18 (×2): qty 1

## 2024-03-18 MED ORDER — DAPTOMYCIN-SODIUM CHLORIDE 700-0.9 MG/100ML-% IV SOLN
8.0000 mg/kg | INTRAVENOUS | Status: DC
  Administered 2024-03-19: 700 mg via INTRAVENOUS
  Filled 2024-03-18: qty 100

## 2024-03-18 MED ORDER — FENTANYL CITRATE (PF) 100 MCG/2ML IJ SOLN: 25.0000 ug | INTRAMUSCULAR | Status: DC | PRN

## 2024-03-18 MED ORDER — ONDANSETRON HCL 4 MG/2ML IJ SOLN
INTRAMUSCULAR | Status: DC | PRN
  Administered 2024-03-18: 4 mg via INTRAVENOUS

## 2024-03-18 MED ORDER — EMPAGLIFLOZIN 10 MG PO TABS
10.0000 mg | ORAL_TABLET | Freq: Every day | ORAL | Status: DC
  Administered 2024-03-19: 10 mg via ORAL
  Filled 2024-03-18: qty 1

## 2024-03-18 MED ORDER — SODIUM CHLORIDE 0.9 % IV SOLN
2.0000 g | INTRAVENOUS | Status: DC
  Administered 2024-03-19: 2 g via INTRAVENOUS
  Filled 2024-03-18: qty 20

## 2024-03-18 MED ORDER — OXYCODONE HCL 5 MG/5ML PO SOLN: 5.0000 mg | Freq: Once | ORAL | Status: DC | PRN

## 2024-03-18 MED ORDER — FENTANYL CITRATE (PF) 100 MCG/2ML IJ SOLN
INTRAMUSCULAR | Status: DC | PRN
  Administered 2024-03-18 (×2): 50 ug via INTRAVENOUS

## 2024-03-18 MED ORDER — OXYCODONE HCL 5 MG PO TABS: 5.0000 mg | ORAL_TABLET | Freq: Once | ORAL | Status: DC | PRN

## 2024-03-18 MED ORDER — PROPOFOL 10 MG/ML IV BOLUS
INTRAVENOUS | Status: DC | PRN
  Administered 2024-03-18: 30 mg via INTRAVENOUS

## 2024-03-18 MED ORDER — MIDAZOLAM HCL 2 MG/2ML IJ SOLN
INTRAMUSCULAR | Status: AC
  Filled 2024-03-18: qty 2

## 2024-03-18 MED ORDER — PROPOFOL 500 MG/50ML IV EMUL
INTRAVENOUS | Status: DC | PRN
  Administered 2024-03-18: 100 ug/kg/min via INTRAVENOUS

## 2024-03-18 SURGICAL SUPPLY — 56 items
BANDAGE GAUZE 1X75IN STRL (MISCELLANEOUS) ×1 IMPLANT
BLADE OSC/SAGITTAL MD 5.5X18 (BLADE) IMPLANT
BLADE SURG 15 STRL LF DISP TIS (BLADE) ×1 IMPLANT
BLADE SW THK.38XMED LNG THN (BLADE) IMPLANT
BNDG COHESIVE 4X5 TAN STRL LF (GAUZE/BANDAGES/DRESSINGS) ×1 IMPLANT
BNDG COHESIVE 6X5 TAN ST LF (GAUZE/BANDAGES/DRESSINGS) ×1 IMPLANT
BNDG ELASTIC 4INX 5YD STR LF (GAUZE/BANDAGES/DRESSINGS) ×1 IMPLANT
BNDG ELASTIC 4X5.8 VLCR NS LF (GAUZE/BANDAGES/DRESSINGS) ×1 IMPLANT
BNDG ESMARCH 4X12 STRL LF (GAUZE/BANDAGES/DRESSINGS) ×1 IMPLANT
BNDG GAUZE DERMACEA FLUFF 4 (GAUZE/BANDAGES/DRESSINGS) ×1 IMPLANT
BNDG STRETCH GAUZE 3IN X12FT (GAUZE/BANDAGES/DRESSINGS) ×1 IMPLANT
CANISTER WOUND CARE 500ML ATS (WOUND CARE) ×1 IMPLANT
CUFF TOURN SGL QUICK 12 (TOURNIQUET CUFF) IMPLANT
CUFF TOURN SGL QUICK 18X4 (TOURNIQUET CUFF) IMPLANT
DRAPE FLUOR MINI C-ARM 54X84 (DRAPES) IMPLANT
DRAPE XRAY CASSETTE 23X24 (DRAPES) IMPLANT
DRSG MEPILEX FLEX 3X3 (GAUZE/BANDAGES/DRESSINGS) IMPLANT
DURAPREP 26ML APPLICATOR (WOUND CARE) ×1 IMPLANT
ELECTRODE REM PT RTRN 9FT ADLT (ELECTROSURGICAL) ×1 IMPLANT
GAUZE PACKING 0.25INX5YD STRL (GAUZE/BANDAGES/DRESSINGS) ×1 IMPLANT
GAUZE SPONGE 4X4 12PLY STRL (GAUZE/BANDAGES/DRESSINGS) ×1 IMPLANT
GAUZE STRETCH 2X75IN STRL (MISCELLANEOUS) ×1 IMPLANT
GAUZE XEROFORM 1X8 LF (GAUZE/BANDAGES/DRESSINGS) ×1 IMPLANT
GLOVE BIOGEL PI IND STRL 6.5 (GLOVE) ×1 IMPLANT
GLOVE SURG SYN 6.5 PF PI (GLOVE) ×1 IMPLANT
GOWN STRL REUS W/ TWL LRG LVL3 (GOWN DISPOSABLE) ×1 IMPLANT
GOWN STRL REUS W/TWL MED LVL3 (GOWN DISPOSABLE) ×1 IMPLANT
HANDPIECE VERSAJET DEBRIDEMENT (MISCELLANEOUS) IMPLANT
IV 0.9% NACL 1000 ML (IV SOLUTION) ×1 IMPLANT
KIT DRSG VAC SLVR GRANUFM (MISCELLANEOUS) ×1 IMPLANT
KIT TURNOVER KIT A (KITS) ×1 IMPLANT
LABEL OR SOLS (LABEL) ×1 IMPLANT
MANIFOLD NEPTUNE II (INSTRUMENTS) ×1 IMPLANT
NDL FILTER BLUNT 18X1 1/2 (NEEDLE) ×1 IMPLANT
NDL HYPO 25X1 1.5 SAFETY (NEEDLE) ×1 IMPLANT
NEEDLE FILTER BLUNT 18X1 1/2 (NEEDLE) ×1 IMPLANT
NEEDLE HYPO 25X1 1.5 SAFETY (NEEDLE) ×1 IMPLANT
NS IRRIG 500ML POUR BTL (IV SOLUTION) ×1 IMPLANT
PACK EXTREMITY ARMC (MISCELLANEOUS) ×1 IMPLANT
PACKING GAUZE IODOFORM 1INX5YD (GAUZE/BANDAGES/DRESSINGS) IMPLANT
PAD ABD DERMACEA PRESS 5X9 (GAUZE/BANDAGES/DRESSINGS) IMPLANT
PENCIL SMOKE EVACUATOR (MISCELLANEOUS) ×1 IMPLANT
RASP SM TEAR CROSS CUT (RASP) IMPLANT
SHIELD FULL FACE ANTIFOG 7M (MISCELLANEOUS) ×1 IMPLANT
SOL .9 NS 3000ML IRR UROMATIC (IV SOLUTION) ×1 IMPLANT
SOLN STERILE WATER 500 ML (IV SOLUTION) ×1 IMPLANT
SPONGE T-LAP 18X18 ~~LOC~~+RFID (SPONGE) IMPLANT
STOCKINETTE STRL 3IN 960336 (SOFTGOODS) IMPLANT
STOCKINETTE STRL 6IN 960660 (GAUZE/BANDAGES/DRESSINGS) IMPLANT
SUT VIC AB 3-0 SH 27X BRD (SUTURE) ×1 IMPLANT
SUTURE EHLN 3-0 FS-10 30 BLK (SUTURE) IMPLANT
SWAB CULTURE AMIES ANAERIB BLU (MISCELLANEOUS) IMPLANT
SYR 10ML LL (SYRINGE) ×1 IMPLANT
SYR 3ML LL SCALE MARK (SYRINGE) ×1 IMPLANT
TIP FAN IRRIG PULSAVAC PLUS (DISPOSABLE) ×1 IMPLANT
TRAP FLUID SMOKE EVACUATOR (MISCELLANEOUS) ×1 IMPLANT

## 2024-03-18 NOTE — Progress Notes (Signed)
 Pharmacy - Brief Note  Per ID, starting daptomycin on 12/5. While on daptomycin, holding home atorvastatin.

## 2024-03-18 NOTE — Transfer of Care (Signed)
 Immediate Anesthesia Transfer of Care Note  Patient: Christian Flores  Procedure(s) Performed: IRRIGATION AND DEBRIDEMENT WOUND (Right) BIOPSY, BONE (Right)  Patient Location: PACU  Anesthesia Type:General  Level of Consciousness: drowsy  Airway & Oxygen Therapy: Patient Spontanous Breathing and Patient connected to face mask oxygen  Post-op Assessment: Report given to RN  Post vital signs: stable  Last Vitals:  Vitals Value Taken Time  BP 100/67 03/18/24 18:38  Temp    Pulse 80 03/18/24 18:39  Resp 21 03/18/24 18:39  SpO2 99 % 03/18/24 18:39  Vitals shown include unfiled device data.  Last Pain:  Vitals:   03/18/24 1620  TempSrc: Temporal  PainSc: 7       Patients Stated Pain Goal: 2 (03/17/24 2358)  Complications: No notable events documented.

## 2024-03-18 NOTE — Brief Op Note (Signed)
 BRIEF POSTOPERATIVE NOTE   Christian Flores [id number] DOS: 03/18/24   SURGEON: Kathlynn Swofford G. Polly Barner, DPM  ASSISTANT(S): NONE  PRE-OPERATIVE DIAGNOSIS: acute osteomyelitis, right foot  POST-OPERATIVE DIAGNOSIS: same  PROCEDURE PERFORMED: Irrigation and debridement of all nonviable soft tissue and bone, right foot 2.  Bone biopsy, right foot   ANESTHESIA: IV sedation    ESTIMATED BLOOD LOSS: Minimal  SPECIMEN(S): Core biopsy, fifth metatarsal head, right foot * No order type specified *   COMPLICATIONS: None  CONDITION ON TRANSFER: Stable full  DISPOSITION: Resume inpatient management  Christian Flores Christian Flores

## 2024-03-18 NOTE — Progress Notes (Signed)
 Progress Note   Patient: Christian Flores FMW:[id number] DOB: 07/23/1960 DOA: 03/16/2024     2 DOS: the patient was seen and examined on 03/18/2024   Brief hospital course: 63 y.o. male with medical history significant of IIDM on diet control, diabetic neuropathy, CKD stage II, HTN, presented with worsening of right foot ulcer infection.   Patient first developed a ulcer on the lateral side of right foot about 3 weeks ago, he has been doing self care with dressing changes however the ulcer gradually become thicker and with more purulent discharge.  Went to see podiatry yesterday who recommended p.o. antibiotics and sent patient to ED.  Patient denies any fever or chills.   ED Course: Afebrile, nontachycardic hypotension not hypoxic.  MRI positive for fifth metatarsal head and proximal phalanx osteomyelitis.   Patient was started on vancomycin  and Zosyn in the ED.  12/3.  Patient wants to avoid amputation for osteomyelitis.  Will get infectious disease consultation. 12/4.  Patient to go to the operating room for cleanout today.  Again, does not want amputation.  Will likely need 6 weeks of IV antibiotics.  Assessment and Plan: * Osteomyelitis of fifth toe of right foot [facility]) Patient hoping to avoid amputation.  Podiatry to take to the operating room for cleanout procedure today.  ID consultation appreciated.  Currently on vancomycin  and cefepime .  Likely will need 6 weeks of IV antibiotics.  Foot ulcer, right, with fat layer exposed [facility]) Appreciate podiatry consultation.  Type 2 diabetes mellitus with hyperlipidemia [facility]) Patient takes Trulicity weekly at home.  On sliding scale insulin here and Jardiance.  Hold Jardiance today while n.p.o.  Essential hypertension On metoprolol   Overweight (BMI 25.0-29.9) BMI 27.37        Subjective: Patient upset that he has to be n.p.o. all day for procedure.  Admitted with osteomyelitis in the foot.  Wants to avoid amputation  Physical  Exam: Vitals:   03/17/24 2146 03/17/24 2358 03/18/24 0358 03/18/24 0727  BP: 131/71  (!) 142/81 137/78  Pulse: 80  73   Resp: 15 16 15 18   Temp: 98.8 F (37.1 C)  98.2 F (36.8 C) 98.1 F (36.7 C)  TempSrc: Oral  Oral Oral  SpO2: 99%  99% 100%  Weight:      Height:       Physical Exam HENT:     Head: Normocephalic.  Eyes:     General: Lids are normal.     Conjunctiva/sclera: Conjunctivae normal.  Cardiovascular:     Rate and Rhythm: Normal rate and regular rhythm.     Heart sounds: Normal heart sounds, S1 normal and S2 normal.  Pulmonary:     Breath sounds: No decreased breath sounds, wheezing, rhonchi or rales.  Abdominal:     Palpations: Abdomen is soft.     Tenderness: There is no abdominal tenderness.  Musculoskeletal:     Right lower leg: No swelling.     Left lower leg: No swelling.  Skin:    General: Skin is warm.     Comments: Right foot covered  Neurological:     Mental Status: He is alert and oriented to person, place, and time.     Data Reviewed: No new data  Disposition: Status is: Inpatient Remains inpatient appropriate because: Operating room for cleanout procedure today.  Planned Discharge Destination: Home with Home Health    Time spent: 27 minutes Case discussed with podiatry  Author: Charlie Patterson, MD 03/18/2024 1:23 PM  For on call review  [email or web address]

## 2024-03-18 NOTE — Plan of Care (Signed)
  Problem: Clinical Measurements: Goal: Ability to maintain clinical measurements within normal limits will improve Outcome: Progressing Goal: Will remain free from infection Outcome: Progressing Goal: Diagnostic test results will improve Outcome: Progressing Goal: Respiratory complications will improve Outcome: Progressing Goal: Cardiovascular complication will be avoided Outcome: Progressing   Problem: Education: Goal: Knowledge of General Education information will improve Description: Including pain rating scale, medication(s)/side effects and non-pharmacologic comfort measures Outcome: Progressing   Problem: Pain Managment: Goal: General experience of comfort will improve and/or be controlled Outcome: Progressing

## 2024-03-18 NOTE — Progress Notes (Signed)
 INFECTIOUS DISEASE PROGRESS NOTE Date of Admission:  03/16/2024     ID: Christian Flores is a 63 y.o. male with  DFI  Principal Problem:   Osteomyelitis of fifth toe of right foot (HCC) Active Problems:   Foot ulcer, right, with fat layer exposed (HCC)   Essential hypertension   Type 2 diabetes mellitus with hyperlipidemia (HCC)   Overweight (BMI 25.0-29.9)   Subjective: No new co, no fevers.   ROS  Eleven systems are reviewed and negative except per hpi  Medications:  Antibiotics Given (last 72 hours)     Date/Time Action Medication Dose Rate   03/16/24 1353 New Bag/Given   Ampicillin -Sulbactam (UNASYN ) 3 g in sodium chloride  0.9 % 100 mL IVPB 3 g 200 mL/hr   03/16/24 1500 New Bag/Given   vancomycin  (VANCOCIN ) IVPB 1000 mg/200 mL premix 1,000 mg 200 mL/hr   03/16/24 1729 New Bag/Given   vancomycin  (VANCOCIN ) IVPB 1000 mg/200 mL premix 1,000 mg 200 mL/hr   03/16/24 2041 New Bag/Given   ceFEPIme  (MAXIPIME ) 2 g in sodium chloride  0.9 % 100 mL IVPB 2 g 200 mL/hr   03/17/24 0422 New Bag/Given   ceFEPIme  (MAXIPIME ) 2 g in sodium chloride  0.9 % 100 mL IVPB 2 g 200 mL/hr   03/17/24 0534 New Bag/Given   vancomycin  (VANCOCIN ) IVPB 1000 mg/200 mL premix 1,000 mg 200 mL/hr   03/17/24 1121 New Bag/Given   ceFEPIme  (MAXIPIME ) 2 g in sodium chloride  0.9 % 100 mL IVPB 2 g 200 mL/hr   03/17/24 1635 New Bag/Given   vancomycin  (VANCOCIN ) IVPB 1000 mg/200 mL premix 1,000 mg 200 mL/hr   03/17/24 2046 New Bag/Given   ceFEPIme  (MAXIPIME ) 2 g in sodium chloride  0.9 % 100 mL IVPB 2 g 200 mL/hr   03/18/24 0405 New Bag/Given   ceFEPIme  (MAXIPIME ) 2 g in sodium chloride  0.9 % 100 mL IVPB 2 g 200 mL/hr   03/18/24 0537 New Bag/Given   vancomycin  (VANCOCIN ) IVPB 1000 mg/200 mL premix 1,000 mg 200 mL/hr   03/18/24 1138 New Bag/Given   ceFEPIme  (MAXIPIME ) 2 g in sodium chloride  0.9 % 100 mL IVPB 2 g 200 mL/hr       atorvastatin   40 mg Oral Daily   [START ON 03/19/2024] empagliflozin   10 mg Oral Daily    gabapentin   800 mg Oral QID   insulin  aspart  0-15 Units Subcutaneous TID WC   metoprolol  tartrate  50 mg Oral BID   pantoprazole   40 mg Oral Daily    Objective: Vital signs in last 24 hours: Temp:  [98.1 F (36.7 C)-98.8 F (37.1 C)] 98.1 F (36.7 C) (12/04 0727) Pulse Rate:  [73-80] 73 (12/04 0358) Resp:  [15-18] 18 (12/04 0727) BP: (130-142)/(71-81) 137/78 (12/04 0727) SpO2:  [99 %-100 %] 100 % (12/04 0727) Constitutional: He is oriented to person, place, and time. He appears well-developed and well-nourished. No distress.  HENT: anicterc  Mouth/Throat: Oropharynx is clear and moist. No oropharyngeal exudate.  Cardiovascular: Normal rate, regular rhythm and normal heart sounds. Exam reveals no gallop and no friction rub.  No murmur heard.  Pulmonary/Chest: Effort normal and breath sounds normal. No respiratory distress. He has no wheezes.  Abdominal: Soft. Bowel sounds are normal. He exhibits no distension. There is no tenderness.  Lymphadenopathy:  He has no cervical adenopathy.  Neurological: He is alert and oriented to person, place, and time.  Skin: see photo Psychiatric: He has a normal mood and affect. His behavior is normal.  Admit photo      Lab Results Recent Labs    03/16/24 1221 03/17/24 0529  WBC 10.2  --   HGB 12.6*  --   HCT 38.2*  --   NA 136 136  K 4.2 4.0  CL 95* 97*  CO2 30 30  BUN 27* 25*  CREATININE 1.05 0.84    Microbiology: No chief complaint on file. Otpt culture 03/08/24 Anaerobic Culture - [facility] Final report Abnormal   Result 1 - [facility] Bacteroides fragilis Abnormal   Comment: Moderate growth Studies at [facility] have confirmed the observations of others who have demonstrated that Bacteroides fragilis group are routinely susceptible to Cefoxitin, Chloramphenicol, and Metronidazole and are usually resistant to Penicillin, and variably in resistance to Clindamycin.  Aerobic Culture - [facility] Final report Abnormal   Result  1 - [facility] Enterococcus faecalis Abnormal   Comment: Enterococci susceptible to penicillin are predictably susceptible to ampicillin, amoxicillin, ampicillin-sulbactam, amoxicillin-clavulanate, and piperacillin-tazobactam for non-beta-lactamase producing enterococci. (CLSI 2018) For Enterococcus species, aminoglycosides (except for high-level resistance screening), cephalosporins, clindamycin, and trimethoprim-sulfamethoxazole are not effective clinically. (CLSI, M100-S26, 2016) Heavy growth  Result 2 - [facility] Mixed skin flora  Comment: Scant growth  Antimicrobial Susceptibility - [facility] Comment  Comment:       ** S = Susceptible; I = Intermediate; R = Resistant **                    P = Positive; N = Negative             MICS are expressed in micrograms per mL    Antibiotic                 RSLT#1    RSLT#2    RSLT#3    RSLT#4 Linezolid                      S Penicillin                     S Vancomycin                      S   Studies/Results: US  ARTERIAL ABI (SCREENING LOWER EXTREMITY) Result Date: 03/16/2024 CLINICAL DATA:  Diabetic infection of the right foot. EXAM: NONINVASIVE PHYSIOLOGIC VASCULAR STUDY OF BILATERAL LOWER EXTREMITIES TECHNIQUE: Evaluation of both lower extremities were performed at rest, including calculation of ankle-brachial indices with single level Doppler, pressure and pulse volume recording. COMPARISON:  None Available. FINDINGS: Right ABI:  1.07 Left ABI:  1.07 Right Lower Extremity: Monophasic posterior tibial and dorsalis pedis waveforms. Left Lower Extremity: Biphasic/monophasic posterior tibial and monophasic dorsalis pedis waveforms. 1.0-1.4 Normal IMPRESSION: Normal bilateral resting ankle-brachial indices. Distal waveform abnormalities would implicate underlying tibial disease bilaterally. Electronically Signed   By: Marcey Moan M.D.   On: 03/16/2024 17:08    Assessment/Plan: Christian Flores is a 63 y.o. male with diabetes and peripheral neuropathy  with several weeks of R foot metatarsal ulceration progresssed to possible underlying osteomyelitis of the fifth metatarsal head and fifth proximal phalanx based on MRI.  Otpt surface cx enterococcus and bacteroides  Recommendations Overall it is difficult to say if he truly has underlying osteomyelitis although the ulcer is very close to the bone.  His sed rate is elevated at 80 but it had been elevated to 71 a few weeks ago when he was being treated for hand infection. He will have I and D today - send  cx and bone bxp for path and cx if possible Place picc Plan 6 week course. Can adjust based on cxs but for now will daptomycin and ceftriaxone for now as well as a week of oral flagyl for the anaerobes.  FU in ID clinic to try to coordinate with Podiatry Followup    Thank you very much for the consult. Will follow with you.  Alm SHAUNNA Needle   03/18/2024, 2:54 PM

## 2024-03-18 NOTE — Op Note (Addendum)
 FOOT AND ANKLE SURGERY  OPERATIVE REPORT  Christian Flores [id number] DOS: 03/18/24     SURGEON: Shondrika Hoque G. Lenord Fralix, DPM   ASSISTANT(S): NONE   PRE-OPERATIVE DIAGNOSIS: acute osteomyelitis, right foot   POST-OPERATIVE DIAGNOSIS: same   PROCEDURE PERFORMED: Irrigation and debridement of all nonviable soft tissue and bone, right foot 2.  Bone biopsy, right foot  HEMOSTASIS: Tourniquet  ANESTHESIA: IV sedation  ESTIMATED BLOOD LOSS: Minimal  FINDING(S): 1.  Bone with no gross appearance of discoloration or soft consistency.  PATHOLOGY/SPECIMEN(S):   ID Type Source Tests Collected by Time Destination  1 : Right fifth metatarsal biopsy Tissue PATH Bone biopsy SURGICAL PATHOLOGY Tanda Greig MATSU, DPM 03/18/2024 1813     INDICATIONS FOR PROCEDURE:  Christian Flores is a 63 y.o. male that presented / was admitted with acute infection with associated clinical ulceration of the right foot. With clinical wound appearance, and acute infection both medical and surgical treatment options were presented to patient, of which the patient elected to undergo surgical intervention of incision and drainage / irrigation and debridement of all nonviable soft tissue and bone of the right foot along with bone biopsy to assist in determination of acute osteomyelitis suspicion / antibiotic duration guidance. The procedure, alternatives, risks, and limitations in this individual case have been carefully discussed with the patient. All questions have been thoroughly answered and the patient understands the surgery indicated. The patient has requested that this surgical intervention be undertaken and a consent form was signed.   PROCEDURAL DETAILS:  The patient was brought to the operating room and was transitioned to the operating room table in the supine position. A standard awake time-out was conducted to confirm the correct patient, procedure, side, and site. All team members were in agreement.  Anesthesia initiated  and antibiotic coverage from the inpatient floor setting was confirmed.  The operative extremity was prepped with alcohol, and 10 ml of 1:1 0.5% marcaine plain to 1% lidocaine plain local anesthetic was administered for local block. An ankle tourniquet was then applied and set to . The extremity was then scrubbed, prepped and draped in the usual aseptic manner. After elevating and exsanguinating the operative extremity, the tourniquet was inflated to 250 mmHg.   Attention was then directed to the lateral aspect of the foot where a circumferential wound probing to bone was observed. Wound bed noted to be a mixture of approximately 60% fibrotic 30% granular and 10% necrotic.Sharp dissection with #15 blade was conducted to ellipse out borders of the wound to include - the epidermal, dermal and subcutaneous tissue. This tissue was discarded and resultant healthy bleeding wound borders were noted. A mixture of sharp dissection via #15 blade and rongeur was conducted to remove nonviable necrotic structures apparent. All viable neurovascular structures were identified, protected, and retracted throughout the entire procedure.With debridement / dissection deepening, no specific purulent drainage was expressed from the surgical site. No extensive loculations or tracking were noted and thorough blunt exploration conducted identified no extension of wound to entrance of adjacent foot compartment. The surgical site was then evaluated and debrided of all nonviable appearing tissue.SABRA Deep wound swab was not collected given persistent coverage during hospitalization, and prior cultures obtained. The surgical site was then irrigated with copious amounts normal sterile saline. A hemostat and blunt dissection were used to palpate the fifth metatarsal head - spreading identified the fifth metatarsal head and the bone palpated was not grossly compromised. This window of visualization permitted bone biopsy collection. A  JamShidi needle  was used to access the 5th metatarsal head and core biopsy was collected for pathology examination to assist in determination of acute osteomyelitis. This specimen was placed in the appropriate collection container and passed to the back table to send for analysis. The remaining bone and adjacent soft tissue was absent of necrosis and observed to be of viable quality. The surgical site was then irrigated with 3L of NS via pulse lavage The tourniquet was release and the foot was allowed to perfuse to adequately evaluate and address for any arterial or extensive bleeding. Once satisfied with the hemostatic control and perfusion to the foot, the surgical site was packed with 4x4 moist gauze then covered with 4x4 gauze, ABD pad, Kerlix, and an ACE bandage.  Overall, the patient tolerated the procedure and anesthesia well. Patient was transferred to the recovery room with vital signs stable and appropriate vascular status noted to the right foot. Following a period of post-operative monitoring, the patient will be transferred to inpatient floor to resume medical management.SABRA   POST-OPERATIVE PLAN: Patient was instructed to be  primarily HEEL WB on the operative foot in a postoperative shoe.  Dressing changes: Daily - Saline moistened gauze to wound base, paint adjacent skin to wound with betadine,  cover wound with 4x4 gauze, ABD pad, kerlix and 4inch ACE. *Anticipate that we will transition to collagen based dressings in outpatient / home setting given appearance of wound once infection is no longer acute (I.e. on DC)

## 2024-03-18 NOTE — Anesthesia Preprocedure Evaluation (Signed)
 Anesthesia Evaluation  Patient identified by MRN, date of birth, ID band Patient awake    Reviewed: Allergy & Precautions, NPO status , Patient's Chart, lab work & pertinent test results  Airway Mallampati: III  TM Distance: >3 FB Neck ROM: full    Dental  (+) Chipped   Pulmonary neg pulmonary ROS   Pulmonary exam normal        Cardiovascular hypertension, negative cardio ROS Normal cardiovascular exam     Neuro/Psych negative neurological ROS  negative psych ROS   GI/Hepatic negative GI ROS, Neg liver ROS,,,  Endo/Other  negative endocrine ROSdiabetes    Renal/GU negative Renal ROS  negative genitourinary   Musculoskeletal   Abdominal   Peds  Hematology negative hematology ROS (+)   Anesthesia Other Findings Past Medical History: No date: CKD (chronic kidney disease), stage II No date: Diabetes mellitus without complication (HCC) No date: Hypertension  History reviewed. No pertinent surgical history.  BMI    Body Mass Index: 27.37 kg/m      Reproductive/Obstetrics negative OB ROS                              Anesthesia Physical Anesthesia Plan  ASA: 2  Anesthesia Plan: General   Post-op Pain Management: Minimal or no pain anticipated   Induction: Intravenous  PONV Risk Score and Plan: 2 and Propofol infusion and TIVA  Airway Management Planned: Nasal Cannula  Additional Equipment: None  Intra-op Plan:   Post-operative Plan:   Informed Consent: I have reviewed the patients History and Physical, chart, labs and discussed the procedure including the risks, benefits and alternatives for the proposed anesthesia with the patient or authorized representative who has indicated his/her understanding and acceptance.     Dental advisory given  Plan Discussed with: CRNA and Surgeon  Anesthesia Plan Comments: (Discussed risks of anesthesia with patient, including  possibility of difficulty with spontaneous ventilation under anesthesia necessitating airway intervention, PONV, and rare risks such as cardiac or respiratory or neurological events, and allergic reactions. Discussed the role of CRNA in patient's perioperative care. Patient understands.)        Anesthesia Quick Evaluation

## 2024-03-18 NOTE — Interval H&P Note (Signed)
 HISTORY AND PHYSICAL INTERVAL NOTE:  03/18/2024  5:40 PM  Christian Flores  has presented today for surgery, with the diagnosis of acute osteomyelitis.  The various methods of treatment have been discussed with the patient.  No guarantees were given.  After consideration of risks, benefits and other options for treatment, the patient has consented to surgery.  I have reviewed the patients' chart and labs.    A history and physical examination and is consistent with current status today on re-examination. There have been no changes to this history and physical examination. Procedure as elected remains warranted.   Christian Flores KANDICE Blush

## 2024-03-18 NOTE — Anesthesia Postprocedure Evaluation (Signed)
 Anesthesia Post Note  Patient: Ekin Narez  Procedure(s) Performed: IRRIGATION AND DEBRIDEMENT WOUND (Right) BIOPSY, BONE (Right)  Patient location during evaluation: PACU Anesthesia Type: General Level of consciousness: awake and alert Pain management: pain level controlled Vital Signs Assessment: post-procedure vital signs reviewed and stable Respiratory status: spontaneous breathing, nonlabored ventilation, respiratory function stable and patient connected to nasal cannula oxygen Cardiovascular status: blood pressure returned to baseline and stable Postop Assessment: no apparent nausea or vomiting Anesthetic complications: no   No notable events documented.   Last Vitals:  Vitals:   03/18/24 1900 03/18/24 2029  BP: 124/89 (!) 147/85  Pulse: 81 95  Resp: 15 16  Temp: 36.4 C 36.4 C  SpO2: 97% 98%    Last Pain:  Vitals:   03/18/24 1900  TempSrc:   PainSc: 0-No pain                 Debby Mines

## 2024-03-18 NOTE — Progress Notes (Signed)
 Patient became verbally upset, wanting to return to his room NOW  States he does not understand why he has to be in recovery and that his dinner is waiting for him. He also states his surgery was late and that he has insurance and that he should not have to wait.  Attempted to explain importance of recovery, patient became more agitated.  Patient transferred back to room 143

## 2024-03-18 NOTE — Progress Notes (Signed)
 Infectious Disease Long Term IV Antibiotic Orders Dvaughn Fickle Apr 15, 1961  Diagnosis: DFI with possible osteomyelitis  Culture results  03/18/24 cultures to be sent  Anaerobic Culture - [facility] Final report Abnormal   Result 1 - [facility] Bacteroides fragilis Abnormal   Comment: Moderate growth Studies at [facility] have confirmed the observations of others who have demonstrated that Bacteroides fragilis group are routinely susceptible to Cefoxitin, Chloramphenicol, and Metronidazole and are usually resistant to Penicillin, and variably in resistance to Clindamycin.  Aerobic Culture - [facility] Final report Abnormal   Result 1 - [facility] Enterococcus faecalis Abnormal   Comment: Enterococci susceptible to penicillin are predictably susceptible to ampicillin, amoxicillin, ampicillin-sulbactam, amoxicillin-clavulanate, and piperacillin-tazobactam for non-beta-lactamase producing enterococci. (CLSI 2018) For Enterococcus species, aminoglycosides (except for high-level resistance screening), cephalosporins, clindamycin, and trimethoprim-sulfamethoxazole are not effective clinically. (CLSI, M100-S26, 2016) Heavy growth  Result 2 - [facility] Mixed skin flora  Comment: Scant growth  Antimicrobial Susceptibility - [facility] Comment  Comment:       ** S = Susceptible; I = Intermediate; R = Resistant **                    P = Positive; N = Negative             MICS are expressed in micrograms per mL    Antibiotic                 RSLT#1    RSLT#2    RSLT#3    RSLT#4 Linezolid                      S Penicillin                     S Vancomycin                      S    LABS Lab Results  Component Value Date   CREATININE 0.84 03/17/2024   Lab Results  Component Value Date   WBC 10.2 03/16/2024   HGB 12.6 (L) 03/16/2024   HCT 38.2 (L) 03/16/2024   MCV 88.6 03/16/2024   PLT 274 03/16/2024   Lab Results  Component Value Date   ESRSEDRATE 83 (H) 03/17/2024   No results found for:  CRP  Allergies: No Known Allergies  Discharge antibiotics  Ceftriaxone 2 grams every     24          hours Daptomycin 700 mg every  24 hours   (weekly CPK) Oral flagyl 500 mg bid for 7 days   PICC Care per protocol Labs weekly while on IV antibiotics -FAX weekly labs to [phone]  CBC w diff   Comprehensive met panel CRP  CK  Planned duration of antibiotics 6 weeks from surgery 12/4   Stop date Apr 30, 2023 Follow up clinic date 1-2 weeks  Alm SHAUNNA Needle, MD

## 2024-03-19 DIAGNOSIS — L97512 Non-pressure chronic ulcer of other part of right foot with fat layer exposed: Secondary | ICD-10-CM | POA: Diagnosis not present

## 2024-03-19 DIAGNOSIS — I10 Essential (primary) hypertension: Secondary | ICD-10-CM | POA: Diagnosis not present

## 2024-03-19 DIAGNOSIS — E1169 Type 2 diabetes mellitus with other specified complication: Secondary | ICD-10-CM | POA: Diagnosis not present

## 2024-03-19 DIAGNOSIS — M869 Osteomyelitis, unspecified: Secondary | ICD-10-CM | POA: Diagnosis not present

## 2024-03-19 LAB — GLUCOSE, CAPILLARY
Glucose-Capillary: 153 mg/dL — ABNORMAL HIGH (ref 70–99)
Glucose-Capillary: 187 mg/dL — ABNORMAL HIGH (ref 70–99)
Glucose-Capillary: 137 mg/dL — ABNORMAL HIGH (ref 70–99)

## 2024-03-19 LAB — CREATININE, SERUM
Creatinine, Ser: 1.02 mg/dL (ref 0.61–1.24)
GFR, Estimated: >60 mL/min (ref >60)

## 2024-03-19 LAB — CK: Total CK: 69 U/L (ref 49–397)

## 2024-03-19 MED ORDER — CEFTRIAXONE IV (FOR PTA / DISCHARGE USE ONLY): 2.0000 g | INTRAVENOUS | 0 refills | Status: AC

## 2024-03-19 MED ORDER — SODIUM CHLORIDE 0.9% FLUSH: 10.0000 mL | INTRAVENOUS | Status: DC | PRN

## 2024-03-19 MED ORDER — DAPTOMYCIN IV (FOR PTA / DISCHARGE USE ONLY): 700.0000 mg | INTRAVENOUS | 0 refills | Status: AC

## 2024-03-19 MED ORDER — OXYCODONE HCL 5 MG PO TABS: 5.0000 mg | ORAL_TABLET | Freq: Four times a day (QID) | ORAL | 0 refills | Status: AC | PRN

## 2024-03-19 MED ORDER — METRONIDAZOLE 500 MG PO TABS: 500.0000 mg | ORAL_TABLET | Freq: Two times a day (BID) | ORAL | 0 refills | Status: AC

## 2024-03-19 MED ORDER — CHLORHEXIDINE GLUCONATE CLOTH 2 % EX PADS: 6.0000 | MEDICATED_PAD | Freq: Every day | CUTANEOUS | Status: DC

## 2024-03-19 NOTE — Care Management Important Message (Signed)
 Important Message  Patient Details  Name: Christian Flores MRN: [id number] Date of Birth: June 04, 1960   Important Message Given:  Yes - Medicare IM     Zully Frane W, CMA 03/19/2024, 12:41 PM

## 2024-03-19 NOTE — Plan of Care (Signed)

## 2024-03-19 NOTE — Progress Notes (Signed)
 Daily Progress Note   Subjective  - 1 Day Post-Op  Follow-up right foot bone biopsy.  No complaints at this time.  Resting in bed comfortably.  Objective Vitals:   03/18/24 1900 03/18/24 2029 03/19/24 0542 03/19/24 0754  BP: 124/89 (!) 147/85 (!) 145/77 (!) 161/76  Pulse: 81 95 77 80  Resp: 15 16 18 17   Temp: 97.6 F (36.4 C) 97.6 F (36.4 C) 98.3 F (36.8 C) 98.1 F (36.7 C)  TempSrc:      SpO2: 97% 98% 98% 99%  Weight:      Height:        Physical Exam: Wound is stable.  About a 2 cm ulceration full-thickness in nature.  Granular tissue is noted.  No purulent drainage.  No surrounding erythema.   Laboratory CBC    Component Value Date/Time   WBC 10.2 03/16/2024 1221   HGB 12.6 (L) 03/16/2024 1221   HCT 38.2 (L) 03/16/2024 1221   PLT 274 03/16/2024 1221    BMET    Component Value Date/Time   NA 136 03/17/2024 0529   K 4.0 03/17/2024 0529   CL 97 (L) 03/17/2024 0529   CO2 30 03/17/2024 0529   GLUCOSE 135 (H) 03/17/2024 0529   BUN 25 (H) 03/17/2024 0529   CREATININE 1.02 03/19/2024 0555   CALCIUM  9.4 03/17/2024 0529   GFRNONAA >60 03/19/2024 0555    Assessment/Planning: Status post debridement and bone biopsy right fifth metatarsal  Wound is stable.  New dressing applied by myself today.  He should keep this clean and dry.  Follow-up early next week with Dr. Tanda in the outpatient clinic for further close evaluation.  Appreciate infectious disease assistance.  Patient is waiting on PICC line to be placed.  From podiatry standpoint patient is stable for discharge.  Should try to minimize weightbearing but can weight-bear in his postoperative shoe.   Ashley Soulier A  03/19/2024, 1:26 PM

## 2024-03-19 NOTE — Progress Notes (Signed)
 INFECTIOUS DISEASE PROGRESS NOTE Date of Admission:  03/16/2024     ID: Christian Flores is a 63 y.o. male with  DFI  Principal Problem:   Osteomyelitis of fifth toe of right foot (HCC) Active Problems:   Foot ulcer, right, with fat layer exposed (HCC)   Essential hypertension   Type 2 diabetes mellitus with hyperlipidemia (HCC)   Overweight (BMI 25.0-29.9)   Subjective: No new co, no fevers. Getting picc. Had surgy yesterday. Bone bxp sent and pending  ROS  Eleven systems are reviewed and negative except per hpi  Medications:  Antibiotics Given (last 72 hours)     Date/Time Action Medication Dose Rate   03/16/24 1353 New Bag/Given   Ampicillin -Sulbactam (UNASYN ) 3 g in sodium chloride  0.9 % 100 mL IVPB 3 g 200 mL/hr   03/16/24 1500 New Bag/Given   vancomycin  (VANCOCIN ) IVPB 1000 mg/200 mL premix 1,000 mg 200 mL/hr   03/16/24 1729 New Bag/Given   vancomycin  (VANCOCIN ) IVPB 1000 mg/200 mL premix 1,000 mg 200 mL/hr   03/16/24 2041 New Bag/Given   ceFEPIme  (MAXIPIME ) 2 g in sodium chloride  0.9 % 100 mL IVPB 2 g 200 mL/hr   03/17/24 0422 New Bag/Given   ceFEPIme  (MAXIPIME ) 2 g in sodium chloride  0.9 % 100 mL IVPB 2 g 200 mL/hr   03/17/24 0534 New Bag/Given   vancomycin  (VANCOCIN ) IVPB 1000 mg/200 mL premix 1,000 mg 200 mL/hr   03/17/24 1121 New Bag/Given   ceFEPIme  (MAXIPIME ) 2 g in sodium chloride  0.9 % 100 mL IVPB 2 g 200 mL/hr   03/17/24 1635 New Bag/Given   vancomycin  (VANCOCIN ) IVPB 1000 mg/200 mL premix 1,000 mg 200 mL/hr   03/17/24 2046 New Bag/Given   ceFEPIme  (MAXIPIME ) 2 g in sodium chloride  0.9 % 100 mL IVPB 2 g 200 mL/hr   03/18/24 0405 New Bag/Given   ceFEPIme  (MAXIPIME ) 2 g in sodium chloride  0.9 % 100 mL IVPB 2 g 200 mL/hr   03/18/24 0537 New Bag/Given   vancomycin  (VANCOCIN ) IVPB 1000 mg/200 mL premix 1,000 mg 200 mL/hr   03/18/24 1138 New Bag/Given   ceFEPIme  (MAXIPIME ) 2 g in sodium chloride  0.9 % 100 mL IVPB 2 g 200 mL/hr   03/18/24 2031 New Bag/Given    ceFEPIme  (MAXIPIME ) 2 g in sodium chloride  0.9 % 100 mL IVPB 2 g 200 mL/hr   03/18/24 2035 Given   metroNIDAZOLE  (FLAGYL ) tablet 500 mg 500 mg    03/18/24 2153 New Bag/Given   vancomycin  (VANCOCIN ) IVPB 1000 mg/200 mL premix 1,000 mg 200 mL/hr   03/19/24 0534 New Bag/Given   cefTRIAXone  (ROCEPHIN ) 2 g in sodium chloride  0.9 % 100 mL IVPB 2 g 200 mL/hr   03/19/24 1044 Given   metroNIDAZOLE  (FLAGYL ) tablet 500 mg 500 mg    03/19/24 1044 New Bag/Given   DAPTOmycin  (CUBICIN ) IVPB 700 mg/131mL premix 700 mg 200 mL/hr       empagliflozin   10 mg Oral Daily   gabapentin   800 mg Oral QID   insulin  aspart  0-15 Units Subcutaneous TID WC   metoprolol  tartrate  50 mg Oral BID   metroNIDAZOLE   500 mg Oral Q12H   pantoprazole   40 mg Oral Daily    Objective: Vital signs in last 24 hours: Temp:  [97.1 F (36.2 C)-98.3 F (36.8 C)] 98.1 F (36.7 C) (12/05 0754) Pulse Rate:  [77-95] 80 (12/05 0754) Resp:  [15-18] 17 (12/05 0754) BP: (124-172)/(76-89) 161/76 (12/05 0754) SpO2:  [97 %-99 %] 99 % (12/05 0754)  Constitutional: He is oriented to person, place, and time. He appears well-developed and well-nourished. No distress.  HENT: anicterc  Mouth/Throat: Oropharynx is clear and moist. No oropharyngeal exudate.  Cardiovascular: Normal rate, regular rhythm and normal heart sounds. Exam reveals no gallop and no friction rub.  No murmur heard.  Pulmonary/Chest: Effort normal and breath sounds normal. No respiratory distress. He has no wheezes.  Abdominal: Soft. Bowel sounds are normal. He exhibits no distension. There is no tenderness.  Lymphadenopathy:  He has no cervical adenopathy.  Neurological: He is alert and oriented to person, place, and time.  Skin: see photo Psychiatric: He has a normal mood and affect. His behavior is normal.      Admit photo      Lab Results Recent Labs    03/16/24 1221 03/17/24 0529 03/19/24 0555  WBC 10.2  --   --   HGB 12.6*  --   --   HCT 38.2*  --    --   NA 136 136  --   K 4.2 4.0  --   CL 95* 97*  --   CO2 30 30  --   BUN 27* 25*  --   CREATININE 1.05 0.84 1.02    Microbiology: No chief complaint on file. Otpt culture 03/08/24 Anaerobic Culture - [facility] Final report Abnormal   Result 1 - [facility] Bacteroides fragilis Abnormal   Comment: Moderate growth Studies at [facility] have confirmed the observations of others who have demonstrated that Bacteroides fragilis group are routinely susceptible to Cefoxitin, Chloramphenicol, and Metronidazole  and are usually resistant to Penicillin, and variably in resistance to Clindamycin.  Aerobic Culture - [facility] Final report Abnormal   Result 1 - [facility] Enterococcus faecalis Abnormal   Comment: Enterococci susceptible to penicillin are predictably susceptible to ampicillin , amoxicillin, ampicillin -sulbactam, amoxicillin-clavulanate, and piperacillin-tazobactam for non-beta-lactamase producing enterococci. (CLSI 2018) For Enterococcus species, aminoglycosides (except for high-level resistance screening), cephalosporins, clindamycin, and trimethoprim-sulfamethoxazole are not effective clinically. (CLSI, M100-S26, 2016) Heavy growth  Result 2 - [facility] Mixed skin flora  Comment: Scant growth  Antimicrobial Susceptibility - [facility] Comment  Comment:       ** S = Susceptible; I = Intermediate; R = Resistant **                    P = Positive; N = Negative             MICS are expressed in micrograms per mL    Antibiotic                 RSLT#1    RSLT#2    RSLT#3    RSLT#4 Linezolid                      S Penicillin                     S Vancomycin                      S   Studies/Results: US  EKG SITE RITE Result Date: 03/18/2024 If Site Rite image not attached, placement could not be confirmed due to current cardiac rhythm.   Assessment/Plan: Christian Flores is a 63 y.o. male with diabetes and peripheral neuropathy with several weeks of R foot metatarsal ulceration progresssed to  possible underlying osteomyelitis of the fifth metatarsal head and fifth proximal phalanx based on MRI.  Otpt surface cx enterococcus and bacteroides  12/5- had surg  12/4 bone sent fo rpaty  Recommendations Overall it is difficult to say if he truly has underlying osteomyelitis although the ulcer is very close to the bone.  His sed rate is elevated at 80 but it had been elevated to 71 a few weeks ago when he was being treated for hand infection. Bone path pending and will fu as otpt  Place picc Plan 6 week course. Can adjust based on cxs but for now will daptomycin  and ceftriaxone  for now as well as a week of oral flagyl  for the anaerobes.  FU in ID clinic to try to coordinate with Podiatry Followup   Thank you very much for the consult. Will follow with you.  Alm SHAUNNA Needle   03/19/2024, 11:36 AM

## 2024-03-19 NOTE — Progress Notes (Signed)
 Peripherally Inserted Central Catheter Placement  The IV Nurse has discussed with the patient and/or persons authorized to consent for the patient, the purpose of this procedure and the potential benefits and risks involved with this procedure.  The benefits include less needle sticks, lab draws from the catheter, and the patient may be discharged home with the catheter. Risks include, but not limited to, infection, bleeding, blood clot (thrombus formation), and puncture of an artery; nerve damage and irregular heartbeat and possibility to perform a PICC exchange if needed/ordered by physician.  Alternatives to this procedure were also discussed.  Bard Power PICC patient education guide, fact sheet on infection prevention and patient information card has been provided to patient /or left at bedside.    PICC Placement Documentation  PICC Single Lumen 03/19/24 Right Brachial 40 cm 0 cm (Active)  Indication for Insertion or Continuance of Line Prolonged intravenous therapies 03/19/24 1618  Exposed Catheter (cm) 0 cm 03/19/24 1618  Site Assessment Clean, Dry, Intact 03/19/24 1618  Line Status Flushed;Saline locked;Blood return noted 03/19/24 1618  Dressing Type Transparent;Securing device 03/19/24 1618  Dressing Status Antimicrobial disc/dressing in place;Clean, Dry, Intact 03/19/24 1618  Line Care Connections checked and tightened 03/19/24 1618  Line Adjustment (NICU/IV Team Only) No 03/19/24 1618  Dressing Intervention New dressing;Adhesive placed at insertion site (IV team only) 03/19/24 1618  Dressing Change Due 03/26/24 03/19/24 1618       Laurelai Lepp 03/19/2024, 4:19 PM

## 2024-03-19 NOTE — TOC CM/SW Note (Addendum)
 RNCM received a message from the MD via secure chat informing me that patient will get a PICC Line today. Will need the infusion nurse see him to make sure he can do the IV ABT's at home. HH/RN also. RNCM called Pam with [facility] @ [phone] to inform her that the MD needed her to assess and teach patient. Pam said that she would assess/teach the patient this afternoon after 3pm. Referrals submitted for [facility]. Referral's pending. RNCM will continue to follow for discharge planning needs.

## 2024-03-19 NOTE — Discharge Summary (Signed)
 Physician Discharge Summary   Patient: Christian Flores MRN: [id number] DOB: Mar 17, 1961  Admit date:     03/16/2024  Discharge date: 03/19/24  Discharge Physician: Charlie Patterson   PCP: System, Provider Not In   Recommendations at discharge:   Follow-up with your medical doctor 5 days Follow-up podiatry 1 week Follow-up Dr. Epifanio infectious disease 2 weeks  Discharge Diagnoses: Principal Problem:   Osteomyelitis of fifth toe of right foot (HCC) Active Problems:   Foot ulcer, right, with fat layer exposed (HCC)   Type 2 diabetes mellitus with hyperlipidemia (HCC)   Essential hypertension   Overweight (BMI 25.0-29.9)   Hospital Course: 63 y.o. male with medical history significant of IIDM on diet control, diabetic neuropathy, CKD stage II, HTN, presented with worsening of right foot ulcer infection.   Patient first developed a ulcer on the lateral side of right foot about 3 weeks ago, he has been doing self care with dressing changes however the ulcer gradually become thicker and with more purulent discharge.  Went to see podiatry yesterday who recommended p.o. antibiotics and sent patient to ED.  Patient denies any fever or chills.   ED Course: Afebrile, nontachycardic hypotension not hypoxic.  MRI positive for fifth metatarsal head and proximal phalanx osteomyelitis.   Patient was started on vancomycin  and Zosyn in the ED.  12/3.  Patient wants to avoid amputation for osteomyelitis.  Will get infectious disease consultation. 12/4.  Patient to go to the operating room for cleanout today.  Again, does not want amputation.  Will likely need 6 weeks of IV antibiotics. 12/5.  Patient feels okay.  Some pain in his foot.  PICC line ordered.  Infusion nurse to see patient to see if he is physically able to give himself antibiotic.  Patient did well with teaching for IV antibiotics.  Antibiotics will be delivered tonight.  Patient wants to go home.  Assessment and Plan: *  Osteomyelitis of fifth toe of right foot [facility]) Patient hoping to avoid amputation.  Podiatry took to the operating room on 12/4 for cleanout procedure.  ID consultation appreciated.  Currently on daptomycin  and Rocephin .  Will need 6 weeks of IV antibiotics.  Will also need oral Flagyl  for 1 week.  Foot ulcer, right, with fat layer exposed [facility]) Appreciate podiatry consultation.  Type 2 diabetes mellitus with hyperlipidemia [facility]) Patient takes Trulicity weekly at home.  On sliding scale insulin  here and Jardiance .    Essential hypertension On metoprolol   Overweight (BMI 25.0-29.9) BMI 27.37         Consultants: Podiatry, infectious disease Procedures performed: Debridement nonviable soft tissue and bone.  Bone biopsy right foot Disposition: Home health Diet recommendation:  Cardiac and Carb modified diet DISCHARGE MEDICATION: Allergies as of 03/19/2024   No Known Allergies      Medication List     STOP taking these medications    doxycycline 100 MG tablet Commonly known as: VIBRA-TABS   HYDROcodone -acetaminophen  5-325 MG tablet Commonly known as: Norco   ivabradine  7.5 MG Tabs tablet Commonly known as: Corlanor    ondansetron  4 MG disintegrating tablet Commonly known as: Zofran  ODT       TAKE these medications    aspirin EC 81 MG tablet Take 81 mg by mouth daily. Swallow whole.   atorvastatin  40 MG tablet Commonly known as: LIPITOR Take 40 mg by mouth daily.   cefTRIAXone  IVPB Commonly known as: ROCEPHIN  Inject 2 g into the vein daily. Indication:  DFI with possible osteomyelitis First Dose: Yes  Last Day of Therapy:  04/29/2024 Labs - Once weekly:  CBC/D, CMP, and CPK Labs - Once weekly: CRP  -FAX weekly labs to [phone]  Method of administration: IV Push Method of administration may be changed at the discretion of home infusion pharmacist based upon assessment of the patient and/or caregiver's ability to self-administer the medication  ordered. Please remove PICC when antibiotics are completed. Start taking on: March 20, 2024   daptomycin  IVPB Commonly known as: CUBICIN  Inject 700 mg into the vein daily. Indication:  DFI with possible osteomyelitis First Dose: Yes Last Day of Therapy:  04/29/2024 Labs - Once weekly:  CBC/D, CMP, and CPK Labs - Once weekly: CRP  -FAX weekly labs to [phone]  Method of administration: IV Push Method of administration may be changed at the discretion of home infusion pharmacist based upon assessment of the patient and/or caregiver's ability to self-administer the medication ordered. Please remove PICC when antibiotics are completed. Start taking on: March 20, 2024   gabapentin  800 MG tablet Commonly known as: NEURONTIN  Take 800 mg by mouth 4 (four) times daily.   Jardiance  10 MG Tabs tablet Generic drug: empagliflozin  Take 10 mg by mouth daily.   metoprolol  tartrate 50 MG tablet Commonly known as: LOPRESSOR  Take 50 mg by mouth 2 (two) times daily. What changed: Another medication with the same name was removed. Continue taking this medication, and follow the directions you see here.   metroNIDAZOLE  500 MG tablet Commonly known as: FLAGYL  Take 1 tablet (500 mg total) by mouth every 12 (twelve) hours for 7 days.   omeprazole 20 MG capsule Commonly known as: PRILOSEC Take 20 mg by mouth daily.   oxyCODONE  5 MG immediate release tablet Commonly known as: Oxy IR/ROXICODONE  Take 1 tablet (5 mg total) by mouth every 6 (six) hours as needed for up to 5 days for moderate pain (pain score 4-6) or severe pain (pain score 7-10).               Discharge Care Instructions  (From admission, onward)           Start     Ordered   03/19/24 0000  Change dressing on IV access line weekly and PRN  (Home infusion instructions - [facility] )        03/19/24 1721            Follow-up Information     your medical doctor Follow up in 5 day(s).           Wilson, Amy G, DPM Follow up in 1 week(s).   Specialty: Podiatry Contact information: [address] [place] [address] [phone]         Epifanio Alm SQUIBB, MD Follow up in 2 week(s).   Specialty: Infectious Diseases Why: Infectious disease follow up for iv antibiotics Contact information: [address] [place] [address] [phone]                Discharge Exam: Fredricka Weights   03/16/24 1214 03/16/24 1218  Weight: 81.6 kg 81.6 kg   Physical Exam HENT:     Head: Normocephalic.  Eyes:     General: Lids are normal.     Conjunctiva/sclera: Conjunctivae normal.  Cardiovascular:     Rate and Rhythm: Normal rate and regular rhythm.     Heart sounds: Normal heart sounds, S1 normal and S2 normal.  Pulmonary:     Breath sounds: No decreased breath sounds, wheezing, rhonchi or rales.  Abdominal:     Palpations: Abdomen is soft.     Tenderness: There is no abdominal tenderness.  Musculoskeletal:     Right lower leg: No swelling.     Left lower leg: No swelling.  Skin:    General: Skin is warm.     Comments: Right foot covered  Neurological:     Mental Status: He is alert and oriented to person, place, and time.      Condition at discharge: stable  The results of significant diagnostics from this hospitalization (including imaging, microbiology, ancillary and laboratory) are listed below for reference.   Imaging Studies: US  EKG SITE RITE Result Date: 03/18/2024 If Site Rite image not attached, placement could not be confirmed due to current cardiac rhythm.  US  ARTERIAL ABI (SCREENING LOWER EXTREMITY) Result Date: 03/16/2024 CLINICAL DATA:  Diabetic infection of the right foot. EXAM: NONINVASIVE PHYSIOLOGIC VASCULAR STUDY OF BILATERAL LOWER EXTREMITIES TECHNIQUE: Evaluation of both lower extremities were performed at rest, including calculation of ankle-brachial indices with single level Doppler, pressure and pulse volume recording.  COMPARISON:  None Available. FINDINGS: Right ABI:  1.07 Left ABI:  1.07 Right Lower Extremity: Monophasic posterior tibial and dorsalis pedis waveforms. Left Lower Extremity: Biphasic/monophasic posterior tibial and monophasic dorsalis pedis waveforms. 1.0-1.4 Normal IMPRESSION: Normal bilateral resting ankle-brachial indices. Distal waveform abnormalities would implicate underlying tibial disease bilaterally. Electronically Signed   By: Marcey Moan M.D.   On: 03/16/2024 17:08   MRI Right foot without contrast Result Date: 03/16/2024 CLINICAL DATA:  Soft tissue infection suspected, foot, xray done EXAM: MRI OF THE RIGHT FOREFOOT WITHOUT CONTRAST TECHNIQUE: Multiplanar, multisequence MR imaging of the right forefoot was performed. No intravenous contrast was administered. COMPARISON:  None Available. FINDINGS: Bones/Joint/Cartilage Apparent area of soft tissue ulceration lateral to the 5th metatarsophalangeal joint, further described below. There are underlying signal abnormalities within the head of the 5th metatarsal and the 5th proximal phalanx, suspicious for osteomyelitis. Namely, there is increased T2 and decreased T1 marrow signal in these areas with suspected early cortical destruction of the 5th metatarsal head, best seen on the coronal T1 weighted images. T2 hyperintensity extends proximally into the mid shaft of the 5th metatarsal. There is a small effusion of the 5th metatarsophalangeal joint. The additional metatarsals and phalanges appear unremarkable. The alignment is normal at the Lisfranc joint. No significant tarsal bone abnormalities identified. Ligaments Intact Lisfranc ligament. The collateral ligaments of the metatarsophalangeal joints appear intact. Muscles and Tendons Mild generalized muscular T2 hyperintensity. The forefoot tendons appear intact, without significant tenosynovitis. Soft tissues As above, apparent soft tissue ulceration lateral to the 5th metatarsophalangeal joint with  underlying soft tissue inflammatory changes. No organized fluid collection or foreign body identified. Dorsal subcutaneous edema extends into the medial aspect of the forefoot without focal fluid collection. Decreased T1 signal within the subcutaneous fat plantar to the proximal phalanx of the great toe is without apparent soft tissue ulceration and likely represents an incidental pressure lesion. IMPRESSION: 1. Apparent soft tissue ulceration lateral to the 5th metatarsophalangeal joint with underlying soft tissue inflammatory changes and marrow signal abnormalities in the 5th metatarsal head and 5th proximal phalanx, suspicious for osteomyelitis. Correlate clinically. 2. No evidence of soft tissue abscess. 3. Additional signal changes in the plantar subcutaneous fat of the proximal great toe, presumably a pressure lesion in the absence of soft tissue ulceration in this area. Correlate clinically. 4. Dorsal subcutaneous edema extends into the medial aspect of the forefoot without focal fluid collection. Electronically Signed  By: Elsie Perone M.D.   On: 03/16/2024 13:54    Microbiology: Results for orders placed or performed during the hospital encounter of 03/16/24  Surgical pcr screen     Status: None   Collection Time: 03/17/24 11:30 PM   Specimen: Nasal Mucosa; Nasal Swab  Result Value Ref Range Status   MRSA, PCR NEGATIVE NEGATIVE Final   Staphylococcus aureus NEGATIVE NEGATIVE Final    Comment: (NOTE) The Xpert SA Assay (FDA approved for NASAL specimens in patients 69 years of age and older), is one component of a comprehensive surveillance program. It is not intended to diagnose infection nor to guide or monitor treatment. Performed at [facility], [address] [place], [place] [address]     Labs: CBC: Recent Labs  Lab 03/16/24 1221  WBC 10.2  NEUTROABS 7.0  HGB 12.6*  HCT 38.2*  MCV 88.6  PLT 274   Basic Metabolic Panel: Recent Labs  Lab 03/16/24 1221  03/17/24 0529 03/19/24 0555  NA 136 136  --   K 4.2 4.0  --   CL 95* 97*  --   CO2 30 30  --   GLUCOSE 149* 135*  --   BUN 27* 25*  --   CREATININE 1.05 0.84 1.02  CALCIUM  9.9 9.4  --    Liver Function Tests: Recent Labs  Lab 03/16/24 1221  AST 26  ALT 30  ALKPHOS 83  BILITOT 0.6  PROT 8.4*  ALBUMIN 4.0   CBG: Recent Labs  Lab 03/18/24 1839 03/18/24 2027 03/19/24 0752 03/19/24 1123 03/19/24 1715  GLUCAP 126* 181* 153* 187* 137*    Discharge time spent: greater than 30 minutes.  Signed: Charlie Patterson, MD [facility] 03/19/2024

## 2024-03-19 NOTE — Progress Notes (Signed)
 Progress Note   Patient: Christian Flores FMW:[id number] DOB: 26-Mar-1961 DOA: 03/16/2024     3 DOS: the patient was seen and examined on 03/19/2024   Brief hospital course: 63 y.o. male with medical history significant of IIDM on diet control, diabetic neuropathy, CKD stage II, HTN, presented with worsening of right foot ulcer infection.   Patient first developed a ulcer on the lateral side of right foot about 3 weeks ago, he has been doing self care with dressing changes however the ulcer gradually become thicker and with more purulent discharge.  Went to see podiatry yesterday who recommended p.o. antibiotics and sent patient to ED.  Patient denies any fever or chills.   ED Course: Afebrile, nontachycardic hypotension not hypoxic.  MRI positive for fifth metatarsal head and proximal phalanx osteomyelitis.   Patient was started on vancomycin  and Zosyn in the ED.  12/3.  Patient wants to avoid amputation for osteomyelitis.  Will get infectious disease consultation. 12/4.  Patient to go to the operating room for cleanout today.  Again, does not want amputation.  Will likely need 6 weeks of IV antibiotics. 12/5.  Patient feels okay.  Some pain in his foot.  PICC line ordered.  Infusion nurse to see patient to see if he is physically able to give himself antibiotic.  Assessment and Plan: * Osteomyelitis of fifth toe of right foot [facility]) Patient hoping to avoid amputation.  Podiatry took to the operating room on 12/4 for cleanout procedure.  ID consultation appreciated.  Currently on vancomycin  and cefepime .  Will need 6 weeks of IV antibiotics.  Will also need oral Flagyl  for 1 week.  Foot ulcer, right, with fat layer exposed [facility]) Appreciate podiatry consultation.  Type 2 diabetes mellitus with hyperlipidemia [facility]) Patient takes Trulicity weekly at home.  On sliding scale insulin  here and Jardiance .  Patient takes Trulicity weekly and family member may bring in.  Essential hypertension On  metoprolol   Overweight (BMI 25.0-29.9) BMI 27.37        Subjective: Patient feels okay.  Concerned about his hands and actually giving the antibiotic.  Cleared by podiatry to go home.  PICC line to be done and infusion nurse to see patient.  Physical Exam: Vitals:   03/18/24 1900 03/18/24 2029 03/19/24 0542 03/19/24 0754  BP: 124/89 (!) 147/85 (!) 145/77 (!) 161/76  Pulse: 81 95 77 80  Resp: 15 16 18 17   Temp: 97.6 F (36.4 C) 97.6 F (36.4 C) 98.3 F (36.8 C) 98.1 F (36.7 C)  TempSrc:      SpO2: 97% 98% 98% 99%  Weight:      Height:       Physical Exam HENT:     Head: Normocephalic.  Eyes:     General: Lids are normal.     Conjunctiva/sclera: Conjunctivae normal.  Cardiovascular:     Rate and Rhythm: Normal rate and regular rhythm.     Heart sounds: Normal heart sounds, S1 normal and S2 normal.  Pulmonary:     Breath sounds: No decreased breath sounds, wheezing, rhonchi or rales.  Abdominal:     Palpations: Abdomen is soft.     Tenderness: There is no abdominal tenderness.  Musculoskeletal:     Right lower leg: No swelling.     Left lower leg: No swelling.  Skin:    General: Skin is warm.     Comments: Right foot covered  Neurological:     Mental Status: He is alert and oriented to person, place,  and time.     Data Reviewed: Creatinine 1.02, GFR 60, CK 69   Disposition: Status is: Inpatient Remains inpatient appropriate because: PICC line to be placed, infusion nurse to teach patient how to do it and observe if he can physically do it with his hands and his limitations.  Planned Discharge Destination: Home with Home Health    Time spent: 28 minutes  Author: Charlie Patterson, MD 03/19/2024 2:19 PM  For on call review [email or web address].

## 2024-03-19 NOTE — Progress Notes (Signed)
 PHARMACY CONSULT NOTE FOR:  OUTPATIENT  PARENTERAL ANTIBIOTIC THERAPY (OPAT)  Indication: DFI with possible osteomyelitis Regimen:  Ceftriaxone  2 g IV q24h Daptomycin  700 mg IV q24h End date: 04/29/2024   **note patient to receive metronidazole  500 mg PO q12h for 7 days (until 03/25/24)**  CBC w diff         Comprehensive met panel CRP      CK FAX weekly labs to [id number] Please remove PICC when antibiotics are complete.  IV antibiotic discharge orders are pended. To discharging provider:  please sign these orders via discharge navigator,  Select New Orders & click on the button choice - Manage This Unsigned Work.     Thank you for allowing pharmacy to be a part of this patient's care.  Leonor BROCKS Alonda Weaber 03/19/2024, 1:53 PM

## 2024-03-19 NOTE — Plan of Care (Signed)

## 2024-03-22 LAB — SURGICAL PATHOLOGY

## 2024-03-24 MED ORDER — TRULICITY 3 MG/0.5 ML SUBCUTANEOUS PEN INJECTOR
2 refills | 0.00000 days
Start: 2024-03-24 — End: ?

## 2024-03-24 NOTE — Telephone Encounter (Signed)
 Patient is requesting the following refill  Requested Prescriptions     Pending Prescriptions Disp Refills    TRULICITY  3 mg/0.5 mL injection pen [Pharmacy Med Name: TRULICITY  3MG /0.5ML INJ (4 PENS)] 2 mL 2     Sig: ADMINISTER 3 MG UNDER THE SKIN EVERY 7 DAYS       Recent Visits  Date Type Provider Dept   12/02/23 Office Visit [name], FNP [facility] [address] [facility]   06/03/23 Office Visit Clapp, Verneita Molt, FNP [place] [facility] [address] [facility]   Showing recent visits within past 365 days and meeting all other requirements  Future Appointments  Date Type Provider Dept   06/04/24 Appointment [name], FNP [place] [facility] [address] [facility]   Showing future appointments within next 365 days and meeting all other requirements       Labs: A1c:   Hemoglobin A1C (%)   Date Value   12/02/2023 6.8 (H)   03/02/2021 7.1 (A)   03/27/2019 14.2 (H)

## 2024-03-25 MED ORDER — TRULICITY 3 MG/0.5 ML SUBCUTANEOUS PEN INJECTOR
SUBCUTANEOUS | 2 refills | 0.00000 days | Status: CP
Start: 2024-03-25 — End: ?

## 2024-03-26 NOTE — Telephone Encounter (Signed)
 [facility] Nurse called and lvm stating patient was seen and discharged from hospital on 03/19/24 patient was unable to verify medication list with nurse. Patient did agree that he is compliant with medication list. [facility] nurse suggested patient medications be reviewed with his PCP at his next visit. Patient does not have another office visit scheduled until his recommended 6 month follow up in February.    TC,NP: Would you like patient to be seen for hospital follow up and medication review prior to Feb appt?

## 2024-04-12 NOTE — Telephone Encounter (Signed)
 Copied from CRM #[id number]. Topic: Access To Clinicians - Req Clinic Call Back  >> Apr 12, 2024 10:34 AM Sharlet HERO wrote:  Pt called at 10:32am states he is still out of town and need to r/s the [place] F/U.    Next avail is not until 05/18/24, Pt requesting to please contact him for an earlier appt

## 2024-04-16 DIAGNOSIS — K219 Gastro-esophageal reflux disease without esophagitis: Principal | ICD-10-CM

## 2024-04-16 MED ORDER — OMEPRAZOLE 20 MG CAPSULE,DELAYED RELEASE
ORAL_CAPSULE | Freq: Every day | ORAL | 1 refills | 90.00000 days | Status: CP
Start: 2024-04-16 — End: ?

## 2024-04-16 NOTE — Telephone Encounter (Signed)
 Patient is requesting the following refill  Requested Prescriptions     Pending Prescriptions Disp Refills    omeprazole  (PRILOSEC) 20 MG capsule [Pharmacy Med Name: OMEPRAZOLE  20MG  CAPSULES] 90 capsule 1     Sig: TAKE 1 CAPSULE(20 MG) BY MOUTH DAILY       Recent Visits  Date Type Provider Dept   12/02/23 Office Visit [name], FNP [place] [facility] [address] [facility]   06/03/23 Office Visit Clapp, Verneita Molt, FNP [place] [facility] [address] At [facility]   Showing recent visits within past 365 days and meeting all other requirements  Future Appointments  Date Type Provider Dept   04/26/24 Appointment [name], FNP [place] [facility] [address] [facility]   06/04/24 Appointment Clapp, Verneita Molt, FNP [place] [facility] [address] At [facility]   Showing future appointments within next 365 days and meeting all other requirements       Medication record reviewed.  No active prescription for Plavix

## 2024-04-19 ENCOUNTER — Encounter: Admit: 2024-04-19 | Discharge: 2024-04-19 | Payer: Medicare (Managed Care) | Attending: Nephrology | Primary: Nephrology

## 2024-04-19 DIAGNOSIS — I10 Essential (primary) hypertension: Principal | ICD-10-CM

## 2024-04-19 DIAGNOSIS — D649 Anemia, unspecified: Principal | ICD-10-CM

## 2024-04-19 DIAGNOSIS — Z794 Long term (current) use of insulin: Principal | ICD-10-CM

## 2024-04-19 DIAGNOSIS — E1121 Type 2 diabetes mellitus with diabetic nephropathy: Principal | ICD-10-CM

## 2024-04-19 DIAGNOSIS — E11628 Type 2 diabetes mellitus with other skin complications: Principal | ICD-10-CM

## 2024-04-19 DIAGNOSIS — N182 Chronic kidney disease, stage 2 (mild): Principal | ICD-10-CM

## 2024-04-19 LAB — ALBUMIN / CREATININE URINE RATIO
ALBUMIN QUANT URINE: 26.2 mg/dL
ALBUMIN/CREATININE RATIO: 719.8 ug/mg — ABNORMAL HIGH (ref 0.0–30.0)
CREATININE, URINE: 36.4 mg/dL

## 2024-04-19 LAB — RENAL FUNCTION PANEL
ALBUMIN: 3.4 g/dL (ref 3.4–5.0)
ANION GAP: 10 mmol/L (ref 5–14)
BLOOD UREA NITROGEN: 22 mg/dL (ref 9–23)
BUN / CREAT RATIO: 27
CALCIUM: 9 mg/dL (ref 8.7–10.4)
CHLORIDE: 98 mmol/L (ref 98–107)
CO2: 31 mmol/L (ref 20.0–31.0)
CREATININE: 0.82 mg/dL (ref 0.73–1.18)
EGFR CKD-EPI (2021) MALE: >90 mL/min/1.73m2 (ref >=60)
GLUCOSE RANDOM: 211 mg/dL — ABNORMAL HIGH (ref 70–179)
PHOSPHORUS: 3.1 mg/dL (ref 2.4–5.1)
POTASSIUM: 4.3 mmol/L (ref 3.5–5.1)
SODIUM: 139 mmol/L (ref 135–145)

## 2024-04-19 LAB — IRON & TIBC
IRON SATURATION: 25 % (ref 20–55)
IRON: 66 ug/dL (ref 65–175)
TOTAL IRON BINDING CAPACITY: 267 ug/dL (ref 250–425)

## 2024-04-19 LAB — CBC W/ AUTO DIFF
BASOPHILS ABSOLUTE COUNT: 0.1 10*9/L (ref 0.0–0.1)
BASOPHILS RELATIVE PERCENT: 1.3 %
EOSINOPHILS ABSOLUTE COUNT: 0.8 10*9/L — ABNORMAL HIGH (ref 0.0–0.5)
EOSINOPHILS RELATIVE PERCENT: 9.8 %
HEMATOCRIT: 36.6 % — ABNORMAL LOW (ref 39.0–48.0)
HEMOGLOBIN: 12.2 g/dL — ABNORMAL LOW (ref 12.9–16.5)
LYMPHOCYTES ABSOLUTE COUNT: 1.9 10*9/L (ref 1.1–3.6)
LYMPHOCYTES RELATIVE PERCENT: 23.3 %
MEAN CORPUSCULAR HEMOGLOBIN CONC: 33.3 g/dL (ref 32.0–36.0)
MEAN CORPUSCULAR HEMOGLOBIN: 29.6 pg (ref 25.9–32.4)
MEAN CORPUSCULAR VOLUME: 88.8 fL (ref 77.6–95.7)
MEAN PLATELET VOLUME: 11.7 fL — ABNORMAL HIGH (ref 6.8–10.7)
MONOCYTES ABSOLUTE COUNT: 0.9 10*9/L — ABNORMAL HIGH (ref 0.3–0.8)
MONOCYTES RELATIVE PERCENT: 10.9 %
NEUTROPHILS ABSOLUTE COUNT: 4.5 10*9/L (ref 1.8–7.8)
NEUTROPHILS RELATIVE PERCENT: 54.7 %
PLATELET COUNT: 233 10*9/L (ref 150–450)
RED BLOOD CELL COUNT: 4.13 10*12/L — ABNORMAL LOW (ref 4.26–5.60)
RED CELL DISTRIBUTION WIDTH: 14.4 % (ref 12.2–15.2)
WBC ADJUSTED: 8.2 10*9/L (ref 3.6–11.2)

## 2024-04-19 LAB — LIPID PANEL
CHOLESTEROL: 127 mg/dL (ref <200)
HDL CHOLESTEROL: 47 mg/dL (ref >40)
LDL CHOLESTEROL CALCULATED: 63 mg/dL (ref <100)
NON-HDL CHOLESTEROL: 80 mg/dL (ref <130)
TRIGLYCERIDES: 120 mg/dL (ref <150)

## 2024-04-19 LAB — FERRITIN: FERRITIN: 58.8 ng/mL (ref 30.0–307.3)

## 2024-04-26 ENCOUNTER — Encounter: Admit: 2024-04-26 | Discharge: 2024-04-26 | Payer: Medicare (Managed Care) | Attending: Family | Primary: Family

## 2024-04-26 DIAGNOSIS — E11620 Type 2 diabetes mellitus with diabetic dermatitis: Principal | ICD-10-CM

## 2024-04-26 DIAGNOSIS — I10 Essential (primary) hypertension: Principal | ICD-10-CM

## 2024-04-26 DIAGNOSIS — E785 Hyperlipidemia, unspecified: Principal | ICD-10-CM

## 2024-04-26 DIAGNOSIS — Z794 Long term (current) use of insulin: Principal | ICD-10-CM

## 2024-04-26 DIAGNOSIS — E1169 Type 2 diabetes mellitus with other specified complication: Principal | ICD-10-CM

## 2024-04-26 DIAGNOSIS — E11628 Type 2 diabetes mellitus with other skin complications: Principal | ICD-10-CM

## 2024-04-26 DIAGNOSIS — I6522 Occlusion and stenosis of left carotid artery: Principal | ICD-10-CM

## 2024-04-26 DIAGNOSIS — M869 Osteomyelitis, unspecified: Principal | ICD-10-CM

## 2024-04-26 DIAGNOSIS — Z09 Encounter for follow-up examination after completed treatment for conditions other than malignant neoplasm: Principal | ICD-10-CM

## 2024-04-26 LAB — HEMOGLOBIN A1C
ESTIMATED AVERAGE GLUCOSE: 163 mg/dL
HEMOGLOBIN A1C: 7.3 % — ABNORMAL HIGH (ref 4.8–5.6)

## 2024-04-26 MED ORDER — PATIROMER CALCIUM SORBITEX 8.4 GRAM ORAL POWDER PACKET
PACK | Freq: Every day | ORAL | 1 refills | 90.00000 days | Status: CP
Start: 2024-04-26 — End: ?

## 2024-04-26 MED ORDER — EMPAGLIFLOZIN 25 MG TABLET
ORAL_TABLET | Freq: Every day | ORAL | 3 refills | 90.00000 days | Status: CP
Start: 2024-04-26 — End: 2025-04-25

## 2024-04-26 NOTE — Progress Notes (Signed)
 Assessment and Plan:     Cory Bentley was seen today for hospitalization follow-up.    Diagnoses and all orders for this visit:    Hospital discharge follow-up    Osteomyelitis of fifth toe of right foot    (CMS-HCC)    Primary hypertension  -     Ambulatory referral to Cardiology; Future    Type 2 diabetes mellitus with diabetic dermatitis, with long-term current use of insulin  (CMS-HCC)  -     Hemoglobin A1c  -     Ambulatory referral to Cardiology; Future    Stenosis of left carotid artery  -     Ambulatory referral to Cardiology; Future    Hyperlipidemia associated with type 2 diabetes mellitus (CMS-HCC)    Other orders  -     Shingrix-Zoster-Recombinant, Adjuvanted, IM    Assessment/Plan:     Osteomyelitis of right fifth toe  Osteomyelitis of the right fifth toe, managed with IV antibiotics and oral Flagyl. Currently on Rocephin and daptomycin for six weeks, with treatment ending on January 15th.   Performing dressing changes every other day.   Podiatry follow-up indicates improvement but not yet at desired level.  - Continue IV antibiotics (Rocephin and daptomycin) until January 15th  - Continue dressing changes every other day  - Follow up with podiatry and infectious disease as scheduled    Stenosis of left carotid artery  Moderate to severe plaque in the left carotid artery, likely representing at least 50% stenosis, as noted in May 2025 ultrasound.   No current symptoms such as chest pain, shortness of breath, dizziness, HA or extreme fatigue.   Seeking a second opinion regarding the need for surgical intervention.   - Referred to vascular surgery for a second opinion on carotid artery stenosis    Type 2 diabetes mellitus with complications  - Managing diabetes with Jardiance  20 mg daily, Trulicity  3 mg weekly, and Novolog  insulin  as needed  - Adjusts Novolog  dose based on blood glucose levels (3-4 units before meals and if BS above 170-180 takes 8 units)   - Stopped Levemir   - Last hemoglobin A1c was 7.3  - Checked A1c today  - Continue Trulicity  3 mg weekly  - Continue Novolog  as needed based on blood glucose levels  - Followed by Endo   - Reviewed ADA recommendations for diet and exercise     Primary hypertension  Well-controlled with current medication regimen. Blood pressure today is 116/64 mmHg.  Pt denies CP, SHOB, DOE, palpitations, dizziness, HA's, vision changes   - Continue chlorthalidone  25 mg daily  - Continue Procardia  30 mg daily    General health maintenance  Discussed the importance of the shingles vaccine. Explained that the vaccine reduces the risk and severity of shingles, though it does not guarantee complete prevention.   - Admin 1st shingles vaccine today   - Schedule second dose of shingles vaccine in 2-6 months         I personally spent 30 minutes face-to-face and non-face-to-face in the care of this patient, which includes all pre, intra, and post visit time on the date of service.  All documented time was specific to the E/M visit and does not include any procedures that may have been performed.           Return for Next scheduled follow up.    Subjective:     HPI: Cory Bentley is a 64 y.o. male here for   Chief Complaint   Patient presents  with    Hospitalization Follow-up   :    HPI      History of Present Illness    Cory Bentley is a 64 year old male with coronary artery disease and diabetes who presents for a hospital follow-up for a foot ulcer and seeks a second opinion on carotid artery stenosis.    Right foot ulcer and osteomyelitis  - Hospitalized for osteomyelitis of the right fifth toe, discharged December 5th  - Currently receiving six weeks of IV antibiotics via PICC line (Rocephin and daptomycin) from December 6th to January 15th  - Completed one week of oral Flagyl  - Foot is improving but not yet at podiatrist's desired status  - Performs dressing changes every other day    Carotid artery stenosis  - Carotid ultrasound in May showed fifty percent blockage in left carotid artery with moderate to severe plaque  - Seeking second opinion regarding carotid artery stenosis and surgical intervention   - No chest pain, shortness of breath, dizziness, palpitations, severe headaches, or extreme fatigue    Coronary artery disease  - History of coronary artery disease  - Last echocardiogram in 2024 showed ejection fraction greater than fifty-five percent    Diabetes mellitus  - Managing diabetes with Jardiance  20 mg daily, Trulicity  3 mg weekly, and Novolog  insulin  as needed  - Adjusts Novolog  dose based on blood glucose levels (3-4 units before meals and if BS above 170-180 takes 8 units)   - Stopped Levemir   - Last hemoglobin A1c was 7.3    Hypertension  - Blood pressure today was 116/64  - Takes chlorthalidone  25 mg daily and Procardia  30 mg daily           ROS:   Review of Systems   Constitutional:  Negative for activity change, appetite change, fatigue and unexpected weight change.   Respiratory:  Negative for chest tightness and shortness of breath.    Cardiovascular:  Negative for chest pain and palpitations.   Gastrointestinal:  Negative for abdominal pain, diarrhea, nausea and vomiting.   Neurological:  Negative for dizziness, light-headedness and headaches.   Psychiatric/Behavioral:  Negative for sleep disturbance. The patient is not nervous/anxious.         Review of systems negative unless otherwise noted as per HPI    Objective:     Visit Vitals  BP 116/64   Pulse 83   Ht 172.7 cm (5' 8)   Wt 83.1 kg (183 lb 1.6 oz)   SpO2 98%   BMI 27.84 kg/m??     There were no vitals filed for this visit.     Physical Exam  Constitutional:       General: He is not in acute distress.     Appearance: Normal appearance. He is well-developed and well-groomed. He is not ill-appearing.   HENT:      Head: Normocephalic and atraumatic.      Mouth/Throat:      Lips: Pink.      Mouth: Mucous membranes are moist.   Eyes:      Conjunctiva/sclera: Conjunctivae normal.   Cardiovascular:      Rate and Rhythm: Normal rate and regular rhythm.      Heart sounds: Normal heart sounds.   Pulmonary:      Effort: Pulmonary effort is normal.      Breath sounds: Normal breath sounds and air entry.   Skin:     General: Skin is warm and dry.   Neurological:  Mental Status: He is alert and oriented to person, place, and time.   Psychiatric:         Mood and Affect: Mood normal.         Behavior: Behavior is cooperative.          [facility]:     Medication adherence and barriers to the treatment plan have been addressed. Opportunities to optimize healthy behaviors have been discussed. Patient / caregiver voiced understanding.

## 2024-04-26 NOTE — Progress Notes (Unsigned)
 Referring Provider: [name], FNP     PCP:  [name], FNP    04/26/2024    Chief Complaint: follow-up for proteinuria and CKD    Background:  Mr. Nowell Sites is a 64 y.o. male who is followed here for CKD. He was referred here for evaluation of proteinuria and first seen in 10/2020.     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 His diabetes was poorly controlled for many years, with a documented a1c on his chart of 14%. He notes that this was fairly typical for him during that time period and many times he would max out his glucometer. His glucose is much better controlled now.     FH is positive for a cousin who required kidney transplant x2. The cause of renal disease is unknown. FH is also positive for DM2.      Was in ED 07/25/2021 for cellulitis to his right great toe. His SBP was noted to be 200 and his lisinopril  was increased to 40mg  and he was started on nifedipine  30mg . He is not taking the nifedipine  at this time as it made him feel strange with some parathesias to his neck. He was also placed on hydrochlorothiazide  12.5mg .      Had a Holter monitor placed 11/2022 for syncope.     HPI: Quinn Edgin returns today for follow-up.     Hospitalized for osteo of 5th R toe, surgery 12/4, discharged on dapto and CTX, planned 6 total weeks IV abx. Still has PICC in place, and following with ID. Saw PCP earlier today.     On exam notes that he has some edema in LLE the past couple of days, however on further discussion states that this is not abnormal/unusual for him and that it goes up and down.     PAST MEDICAL HISTORY:  Past Medical History:   Diagnosis Date    Cellulitis of left foot 07/24/2021    Diabetes mellitus (CMS-HCC)     Gastroparesis due to DM (CMS-HCC) 09/09/2019    Hypertension     Psoriasis Substance abuse (CMS-HCC)        ALLERGIES  Patient has no known allergies.    SOCIAL HISTORY  Social History     Socioeconomic History    Marital status: Single   Tobacco Use    Smoking status: Never     Passive exposure: Never    Smokeless tobacco: Never   Vaping Use    Vaping status: Never Used   Substance and Sexual Activity    Alcohol  use: Yes     Comment: 2 beers a month    Drug use: Yes     Frequency: 7.0 times per week     Types: Marijuana     Comment: h/o cocaine abuse; daily marijuana    Sexual activity: Not Currently   Other Topics Concern    Do you use sunscreen? Yes    Tanning bed use? No    Are you easily burned? No    Excessive sun exposure? Yes    Blistering sunburns? No   Social History Narrative    Hx of cocaine abuse.  Denies tobacco.     Social Drivers of Health     Food Insecurity: No Food Insecurity (04/19/2024)    Received from [facility]    Hunger Vital Sign     Within the past 12 months, you worried that your food would run out before you got the money to buy more.: Never true     Within the past 12 months, the food you bought just didn't last and you didn't have money to get more.: Never true   Tobacco Use: Low Risk (04/26/2024)    Patient History     Smoking Tobacco Use: Never     Smokeless Tobacco Use: Never     Passive Exposure: Never   Transportation Needs: No Transportation Needs (04/19/2024)    Received from [facility] - Transportation     In the past 12 months, has lack of transportation kept you from medical appointments or from getting medications?: No     Lack of Transportation (Non-Medical): No   Alcohol  Use: Not At Risk (12/02/2023)    Alcohol  Use     How often do you have a drink containing alcohol ?: Monthly or less     How many drinks containing alcohol  do you have on a typical day when you are drinking?: 1 - 2     How often do you have 5 or more drinks on one occasion?: Never   Housing: Low Risk  (04/19/2024)    Received from [facility]    Housing Stability Vital Sign     In the last 12 months, was there a time when you were not able to pay the mortgage or rent on time?: No     In the past 12 months, how many times have you moved where you were living?: 0     At any time in the past 12 months, were you homeless or living in a shelter (including now)?: No   Utilities: Not At Risk (04/19/2024)    Received from [facility] Utilities     In the past 12 months has the electric, gas, oil, or water company threatened to shut off services in your home?: No   Interpersonal Safety: Not At Risk (01/14/2024)    Interpersonal Safety     Unsafe Where You Currently Live: No     Physically Hurt by Anyone: No     Abused by Anyone: No   Substance Use: Medium Risk (12/02/2023)    Substance Use     In the past year, how often have you used prescription drugs for non-medical reasons?: Never     In the past year, how often have you used illegal drugs?: Daily or Almost Daily     In the past year, have you used any substance for non-medical reasons?: Yes   Financial Resource Strain: Low Risk  (04/19/2024)    Received from [facility]    Overall Financial Resource Strain (CARDIA)     Difficulty of Paying Living Expenses: Not hard at all   Health Literacy: Low Risk (07/24/2022)    Health Literacy     : Never   Internet Connectivity: Possible Internet connectivity concern identified (09/05/2021)    Internet Connectivity     Do you have access to internet services: Yes     Is your internet connection strong enough for you to watch video on your device without major problems?: Yes     Do you have enough data to get through the month?: Yes     Does at least one of the devices have  a camera that you can use for video chat?: Yes         FAMILY HISTORY  Family History   Problem Relation Age of Onset    Diabetes Mother     Diabetes type II Mother     Ovarian cancer Mother     Diabetes Father     Diabetes type II Father Diabetes type II Sister     Diabetes type II Brother     Diabetes type II Maternal Grandmother     Diabetes type II Maternal Grandfather     Diabetes type II Paternal Grandmother     Diabetes type II Paternal Grandfather     Melanoma Neg Hx     Basal cell carcinoma Neg Hx     Squamous cell carcinoma Neg Hx         MEDICATIONS:  Current Outpatient Medications   Medication Sig Dispense Refill    atorvastatin  (LIPITOR ) 40 MG tablet TAKE 1 TABLET(40 MG) BY MOUTH DAILY 90 tablet 2    chlorthalidone  (HYGROTON ) 25 MG tablet Take 1 tablet (25 mg total) by mouth every morning. 100 tablet 3    empagliflozin  (JARDIANCE ) 10 mg tablet Take 2 tablets (20 mg total) by mouth daily. 180 tablet 2    empty container Misc Use as directed to dispose of Stelara  syringes. 1 each 2    flash glucose sensor (FREESTYLE LIBRE 14 DAY SENSOR) kit Apply new sensor every 14 days and use as directed to monitor and check blood sugars 2 each 12    fluocinolone  acetonide oil 0.01 % Drop Apply 2 -4 drops to ears twice a day 20 mL 4    gabapentin  (NEURONTIN ) 800 MG tablet Take 1 tablet (800 mg total) by mouth four (4) times a day. 270 tablet 2    hydrocortisone  2.5 % ointment APPLY TO PSORIASIS IN EARS UP TO TWICE DAILY AS NEEDED UNTIL SMOOTH 28.35 g 0    insulin  aspart (NOVOLOG  FLEXPEN U-100 INSULIN ) 100 unit/mL (3 mL) injection pen Inject 0.1 mL (10 Units total) under the skin Three (3) times a day before meals. 27 mL 3    NIFEdipine  (PROCARDIA  XL) 30 MG 24 hr tablet Take 1 tablet (30 mg total) by mouth daily. 100 tablet 3    omeprazole  (PRILOSEC) 20 MG capsule TAKE 1 CAPSULE(20 MG) BY MOUTH DAILY 90 capsule 1    oxyCODONE  (ROXICODONE ) 5 MG immediate release tablet Take 1 tablet (5 mg total) by mouth every six (6) hours as needed for pain. 20 tablet 0    testosterone  20.25 mg/1.25 gram (1.62 %) gel pump PLACE 2 SPRAYS ON SKIN DAILY 75 g 1    TRULICITY  3 mg/0.5 mL injection pen ADMINISTER 3 MG UNDER THE SKIN EVERY 7 DAYS 2 mL 2    ustekinumab -kfce (YESINTEK ) 45 mg/0.5 mL Syrg Inject the contents of 1 syringe (45 mg total) under the skin every 12 weeks. 2 mL 2    naloxone  (NARCAN ) 4 mg nasal spray 1 spray (4 mg total) into alternating nostrils once as needed for up to 1 dose. One spray in either nostril once for known/suspected opioid overdose. May repeat every 2-3 minutes in alternating nostril til EMS arrives (Patient not taking: Reported on 04/26/2024) 1 each 0     No current facility-administered medications for this visit.       PHYSICAL EXAM:     Vitals:    04/26/24 1253   BP: 123/66   Pulse: 80     CONSTITUTIONAL:  Alert, well appearing, no distress  HEENT: Moist mucous membranes, OP clear. Extra ocular movements intact. Sclerae anicteric.  CARDIOVASCULAR: RRR  PULM: Clear to auscultation bilaterally  EXTREMITIES: 1+ LLE edema, minimal on R. RLE in boot.    NEUROLOGIC: No gross focal motor deficits  PSYCH: alert and oriented     MEDICAL DECISION MAKING    Results for orders placed or performed in visit on 04/19/24   Albumin/creatinine urine ratio   Result Value Ref Range    Creat U 36.4 Undefined mg/dL    Albumin Quantitative, Urine 26.2 Undefined mg/dL    Albumin/Creatinine Ratio 719.8 (H) 0.0 - 30.0 ug/mg   Renal Function Panel   Result Value Ref Range    Sodium 139 135 - 145 mmol/L    Potassium 4.3 3.5 - 5.1 mmol/L    Chloride 98 98 - 107 mmol/L    CO2 31.0 20.0 - 31.0 mmol/L    Anion Gap 10 5 - 14 mmol/L    BUN 22 9 - 23 mg/dL    Creatinine 9.17 9.26 - 1.18 mg/dL    BUN/Creatinine Ratio 27     eGFR CKD-EPI (2021) Male >90 >=60 mL/min/1.98m2    Glucose 211 (H) 70 - 179 mg/dL    Calcium  9.0 8.7 - 10.4 mg/dL    Phosphorus 3.1 2.4 - 5.1 mg/dL    Albumin 3.4 3.4 - 5.0 g/dL   Iron & TIBC   Result Value Ref Range    Iron 66 65 - 175 ug/dL    TIBC 732 749 - 574 ug/dL    Iron Saturation (%) 25 20 - 55 %   Ferritin   Result Value Ref Range    Ferritin 58.8 30.0 - 307.3 ng/mL   Lipid Panel   Result Value Ref Range    Cholesterol, Total 127 <200 mg/dL Cholesterol, HDL 47 >59 mg/dL    Cholesterol, LDL, Calculated 63 <100 mg/dL    Cholesterol, Non-HDL, Calculated 80 <130 mg/dL    Triglycerides 879 <849 mg/dL    Fasting Unknown    CBC w/ Differential   Result Value Ref Range    WBC 8.2 3.6 - 11.2 10*9/L    RBC 4.13 (L) 4.26 - 5.60 10*12/L    HGB 12.2 (L) 12.9 - 16.5 g/dL    HCT 63.3 (L) 60.9 - 48.0 %    MCV 88.8 77.6 - 95.7 fL    MCH 29.6 25.9 - 32.4 pg    MCHC 33.3 32.0 - 36.0 g/dL    RDW 85.5 87.7 - 84.7 %    MPV 11.7 (H) 6.8 - 10.7 fL    Platelet 233 150 - 450 10*9/L    Neutrophils % 54.7 %    Lymphocytes % 23.3 %    Monocytes % 10.9 %    Eosinophils % 9.8 %    Basophils % 1.3 %    Absolute Neutrophils 4.5 1.8 - 7.8 10*9/L    Absolute Lymphocytes 1.9 1.1 - 3.6 10*9/L    Absolute Monocytes 0.9 (H) 0.3 - 0.8 10*9/L    Absolute Eosinophils 0.8 (H) 0.0 - 0.5 10*9/L    Absolute Basophils 0.1 0.0 - 0.1 10*9/L        Creatinine   Date Value Ref Range Status   04/19/2024 0.82 0.73 - 1.18 mg/dL Final   89/84/7974 8.66 (H) 0.73 - 1.18 mg/dL Final   89/98/7974 9.18 0.73 - 1.18 mg/dL Final   92/97/7974 8.94 0.73 - 1.18 mg/dL Final   96/89/7974 8.93  0.73 - 1.18 mg/dL Final   87/69/7978 1.0 0.5 - 1.4 mg/dL Final   89/85/7978 0.9 0.5 - 1.4 mg/dL Final   98/79/7978 0.8 0.5 - 1.4 mg/dL Final   98/83/7978 1.1 0.5 - 1.4 mg/dL Final   87/78/7979 1.0 0.5 - 1.4 mg/dL Final        No results for input(s): NA, K, CL, BUN, CREATININE, GFR, GLU in the last 24 hours.    Invalid input(s): C02    IMAGING STUDIES:     ASSESSMENT/PLAN:    Mr.Harbert Thorman is a 64 y.o. patient with a past medical history significant for DM, HTN, and CKD with albuminuria who is being seen in clinic.     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   Lab Results   Component Value Date    CREATININE 0.82 04/19/2024   - Most recent Cr significantly improved/better than baseline.   - The patient was counseled on avoiding nephrotoxic agents including NSAIDs   - On atorvastatin  40mg  daily. LDL goal <70 given DM, CKD + albuminuria. Most Recent LDL: 63  LDL date: 04/19/2024   - UACR 719.65mcg/mg. ACEi/ARB - lisinopril  stopped in 06/2023 due to hyperkalemia (severe, multiple episodes in spite of thiazide). Have not restarted. SGLT2i - on Jardiance  10mg  daily.     H/o hyperkalemia: was consistently hyperkalemic since 11/2022, has had hyperkalemia in the past (2021, 2023). Markedly elevated to 6.4 recently on 2/18 with PCP, started on thiazide at that time.   Lab Results   Component Value Date    K 4.3 04/19/2024   - On chlorthalidone  25mg  daily  - Discussed limiting dietary K and given printed handout at previous visit and reviewed with him.   - Consider potassium binder in the future to allow for ACEi/ARB use, though he has been reluctant to take more medications.     Monitoring for anemia:   Lab Results   Component Value Date    HGB 12.2 (L) 04/19/2024     Lab Results   Component Value Date    IRON 66 04/19/2024    TIBC 267 04/19/2024    FERRITIN 58.8 04/19/2024   - Iron studies: Tsat 25%, ferritin 58.8 - acceptable given his renal function. Had colonoscopy/EGD 10/06/23 - 2 sessile polyps which were retrieved, otherwise unremarkable. Not anemic.     HTN: Goal <130/80.   BP Readings from Last 3 Encounters:   04/26/24 123/66   04/26/24 116/64   01/28/24 153/85   - Current meds: chlorthalidone  25mg  daily, nifedipine  30mg  daily.   - On nifedipine  in the past - stopped due to hypotension/parasthesias (unclear). He was seen in 01/2023 by neurology for numbness/tingling/weakness/balance issues, and I see no mention of calcium  channel blocker or concern about this. Cardiology note from 01/2023 indicates that he was on nifedipine  at that time and to continue. Also appears he was previously on metoprolol, unclear when this was stopped/why (appears he was on at the time of his cardiology visit in 01/2023).   - Lisinopril  stopped in 06/2023 - would not restart unless potassium significantly lower and would start low dose with very close monitoring if so. Have discussed that given his repeated severe hyperkalemia would only restart if starting concurrent potassium binder - at low dose and with close monitoring.   - Prescribing Veltassa  8.4g daily, if he is able to get this, will start lisinopril  again with lab recheck after. Can also  try patient assistance program if his insurance doesn't cover.     DM: followed by endocrinology Mathilda Cohn at [facility]). Complicated by retinopathy, neuropathy, nephropathy.   Lab Results   Component Value Date    A1C 6.8 (H) 12/02/2023   - A1C 7.6 on 03/16/24. Encouraged him to work with endocrinology on good blood sugar control given increased albuminuria and complications of DM. Increasing Jardiance  as below.   - UACR 719.82mcg/mg - macroalbuminuria present.   - Current medications: Jardiance  10mg  daily, Trulicity  1.5mg  weekly. Off insulin . Using CGM.   - H/o intolerance to metformin (diarrhea).   - Discussed increasing Jardiance  to 25mg  daily as this can help with glycemic control at his eGFR, he was amenable.     LLE edema: some LLE edema today which he said has been present for a few days but on later discussion he states that it's not out of the normal for him and that the swelling goes up and down.   - Discussed my concern that this could represent DVT especially given increased risk in the setting of PICC. He declines dopplers, advised to monitor closely and reach out if concerns.     Mr.Sandor Sem will follow up in 3 months or sooner as needed  The patient will need renal function panel, UACR within 4 weeks prior to next visit.

## 2024-04-26 NOTE — Patient Instructions (Addendum)
 Increase your Jardiance  from 10mg  to 25mg  daily. I have sent a prescription for 25mg  tablets to your pharmacy.     I am sending Veltassa  (a potassium binder) to your pharmacy. If you are able to get this/it is affordable, let me know and I will send lisinopril  to your pharmacy. I do not want you to start taking lisinopril  without also taking Veltassa  as you have had dangerously high potassium levels multiple times in the past with lisinopril .

## 2024-04-27 NOTE — Progress Notes (Signed)
 Referral for carotid stenosis, duplex ordered- ready to schedule

## 2024-05-01 DIAGNOSIS — E1142 Type 2 diabetes mellitus with diabetic polyneuropathy: Principal | ICD-10-CM

## 2024-05-01 MED ORDER — GABAPENTIN 800 MG TABLET
ORAL_TABLET | 2 refills | 0.00000 days
Start: 2024-05-01 — End: ?

## 2024-05-04 MED ORDER — GABAPENTIN 800 MG TABLET
ORAL_TABLET | 1 refills | 0.00000 days | Status: CP
Start: 2024-05-04 — End: ?

## 2024-05-04 NOTE — Telephone Encounter (Signed)
 Patient is requesting the following refill  Requested Prescriptions     Pending Prescriptions Disp Refills    gabapentin  (NEURONTIN ) 800 MG tablet [Pharmacy Med Name: GABAPENTIN  800MG  TABLETS] 270 tablet 2     Sig: TAKE 1 TABLET(800 MG) BY MOUTH THREE TIMES DAILY       Recent Visits  Date Type Provider Dept   04/26/24 Office Visit [name], FNP [facility] [address] [facility]   12/02/23 Office Visit Clapp, Verneita Molt, FNP [facility] [address] [facility]   06/03/23 Office Visit Clapp, Verneita Molt, FNP [facility] [address] [facility]   Showing recent visits within past 365 days and meeting all other requirements  Future Appointments  Date Type Provider Dept   06/04/24 Appointment [name], FNP [facility] [address] [facility]   Showing future appointments within next 365 days and meeting all other requirements       Labs: Not applicable this refill

## 2024-05-12 ENCOUNTER — Inpatient Hospital Stay: Admit: 2024-05-12 | Discharge: 2024-05-12

## 2024-05-12 ENCOUNTER — Ambulatory Visit
Admit: 2024-05-12 | Discharge: 2024-05-12 | Payer: Medicare (Managed Care) | Attending: Plastic and Reconstructive Surgery | Primary: Plastic and Reconstructive Surgery

## 2024-05-12 NOTE — Progress Notes (Signed)
 Good afternoon Cory Bentley     Your A1c has increased from 6.8 to 7.3   This is considered uncontrolled diabetes   I know that you have an appt with Endo this week so I will let them discuss with you the adjustment of medications to better control your diabetes     Verneita

## 2024-05-12 NOTE — Patient Instructions (Signed)
Dr. Gus Puma has ordered an EMG test for you.  Please give them a call at [phone] to schedule. They will not call you to schedule. If you would like an external referral to another location please reach out to [place] via MyChart or by leaving a voicemail at [phone] and let us know the location you would like to have your EMG done at.     Once your EMG test is scheduled call Orthopaedics at [phone] (at the recording press the number 1) to schedule your follow up appointment with Dr. Gus Puma.  This needs to be at least 7 days after your EMG.

## 2024-05-12 NOTE — Progress Notes (Signed)
 ORTHOPAEDIC RETURN CLINIC NOTE      ASSESSMENT:  Cory Bentley is a 64 y.o. male status post debridement of right small finger middle phalanx for osteomyelitis   Date of surgery: 01/15/24     PLAN:  X-ray today reveals progressive healing at the prior site of osteomyelitis.  Today I explained that I would not recommend any additional procedures on the right small finger despite his recurrence of the PIP flexion contracture.  His small finger is in a relatively functional position that will allow for grip.  Today he explains that he has been having worsening numbness and tingling of the bilateral hands.  I have recommended that he undergo EMG for further evaluation.  He will return to discuss those results.  In addition, I explained that if his small finger remains bothersome and it is getting in the way of normal use of the right hand we could always consider a ray amputation.  He is not ready to proceed with this at this time and will plan on discussing it at a future visit depending on how the small finger is doing.    INTERIM HISTORY:  Doing well.  Minimal discomfort at the small finger.  His bigger concern today is progressive numbness and tingling of the bilateral hands, right worse than left.  This has been ongoing for several years but has been worsening recently.  No history of nerve studies.  He has tried wearing wrist braces without much improvement.     PHYSICAL EXAM:  General: Well developed, well nourished male in no apparent distress  Musculoskeletal: Finger well-healed.  Swelling improved at the right small finger.  No pain with manipulation of the PIP joint.  No pain with palpation of the middle phalanx.  No evidence of ongoing infection.  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  Equivocal bilateral carpal tunnel compression test.  Positive Tinel's at the wrist bilaterally worse on the right than the left.  No thenar wasting.     IMAGING:  3 views of the right small finger reviewed-no evidence of progressive erosion of the middle phalanx.

## 2024-05-27 ENCOUNTER — Encounter: Admission: RE | Payer: Self-pay

## 2024-05-27 ENCOUNTER — Ambulatory Visit: Admission: RE | Admit: 2024-05-27

## 2024-05-27 HISTORY — DX: Myoneural disorder, unspecified: G70.9

## 2024-05-27 HISTORY — DX: Psoriasis, unspecified: L40.9

## 2024-05-27 HISTORY — DX: Gastro-esophageal reflux disease without esophagitis: K21.9
